# Patient Record
Sex: Female | Born: 1945 | Race: White | Hispanic: No | Smoking: Former smoker
Health system: Southern US, Community
[De-identification: ages and names within clinical notes are randomized; demographics above are authoritative.]

## PROBLEM LIST (undated history)

## (undated) DIAGNOSIS — E119 Type 2 diabetes mellitus without complications: Secondary | ICD-10-CM

## (undated) DIAGNOSIS — G250 Essential tremor: Secondary | ICD-10-CM

## (undated) DIAGNOSIS — I495 Sick sinus syndrome: Secondary | ICD-10-CM

## (undated) DIAGNOSIS — R569 Unspecified convulsions: Secondary | ICD-10-CM

## (undated) DIAGNOSIS — I609 Nontraumatic subarachnoid hemorrhage, unspecified: Secondary | ICD-10-CM

## (undated) DIAGNOSIS — I639 Cerebral infarction, unspecified: Secondary | ICD-10-CM

## (undated) DIAGNOSIS — Z5189 Encounter for other specified aftercare: Secondary | ICD-10-CM

## (undated) DIAGNOSIS — M1990 Unspecified osteoarthritis, unspecified site: Secondary | ICD-10-CM

## (undated) DIAGNOSIS — J8410 Pulmonary fibrosis, unspecified: Secondary | ICD-10-CM

## (undated) DIAGNOSIS — I2510 Atherosclerotic heart disease of native coronary artery without angina pectoris: Secondary | ICD-10-CM

## (undated) DIAGNOSIS — E785 Hyperlipidemia, unspecified: Secondary | ICD-10-CM

## (undated) DIAGNOSIS — I4821 Permanent atrial fibrillation: Secondary | ICD-10-CM

## (undated) DIAGNOSIS — K5790 Diverticulosis of intestine, part unspecified, without perforation or abscess without bleeding: Secondary | ICD-10-CM

## (undated) DIAGNOSIS — G459 Transient cerebral ischemic attack, unspecified: Secondary | ICD-10-CM

## (undated) DIAGNOSIS — I739 Peripheral vascular disease, unspecified: Secondary | ICD-10-CM

## (undated) DIAGNOSIS — Z9229 Personal history of other drug therapy: Secondary | ICD-10-CM

## (undated) DIAGNOSIS — Z7901 Long term (current) use of anticoagulants: Secondary | ICD-10-CM

## (undated) DIAGNOSIS — I10 Essential (primary) hypertension: Secondary | ICD-10-CM

## (undated) DIAGNOSIS — M4800 Spinal stenosis, site unspecified: Secondary | ICD-10-CM

## (undated) DIAGNOSIS — Z9861 Coronary angioplasty status: Secondary | ICD-10-CM

## (undated) DIAGNOSIS — I2699 Other pulmonary embolism without acute cor pulmonale: Secondary | ICD-10-CM

## (undated) DIAGNOSIS — K219 Gastro-esophageal reflux disease without esophagitis: Secondary | ICD-10-CM

## (undated) HISTORY — PX: BASAL CELL CARCINOMA EXCISION: SHX1214

## (undated) HISTORY — DX: Unspecified osteoarthritis, unspecified site: M19.90

## (undated) HISTORY — DX: Unspecified convulsions: R56.9

## (undated) HISTORY — DX: Transient cerebral ischemic attack, unspecified: G45.9

## (undated) HISTORY — DX: Other pulmonary embolism without acute cor pulmonale: I26.99

## (undated) HISTORY — DX: Encounter for other specified aftercare: Z51.89

## (undated) HISTORY — DX: Pulmonary fibrosis, unspecified: J84.10

## (undated) HISTORY — DX: Sick sinus syndrome: I49.5

## (undated) HISTORY — DX: Essential tremor: G25.0

## (undated) HISTORY — DX: Nontraumatic subarachnoid hemorrhage, unspecified: I60.9

## (undated) HISTORY — DX: Coronary angioplasty status: Z98.61

## (undated) HISTORY — DX: Type 2 diabetes mellitus without complications: E11.9

## (undated) HISTORY — DX: Gastro-esophageal reflux disease without esophagitis: K21.9

## (undated) HISTORY — PX: OTHER SURGICAL HISTORY: SHX169

## (undated) HISTORY — DX: Cerebral infarction, unspecified: I63.9

## (undated) HISTORY — DX: Diverticulosis of intestine, part unspecified, without perforation or abscess without bleeding: K57.90

## (undated) HISTORY — DX: Spinal stenosis, site unspecified: M48.00

## (undated) HISTORY — DX: Hyperlipidemia, unspecified: E78.5

## (undated) HISTORY — DX: Peripheral vascular disease, unspecified: I73.9

## (undated) HISTORY — DX: Permanent atrial fibrillation: I48.21

## (undated) HISTORY — PX: POLYPECTOMY: SHX149

## (undated) HISTORY — DX: Personal history of other drug therapy: Z92.29

## (undated) HISTORY — DX: Atherosclerotic heart disease of native coronary artery without angina pectoris: I25.10

## (undated) HISTORY — PX: COLONOSCOPY: SHX174

## (undated) HISTORY — DX: Long term (current) use of anticoagulants: Z79.01

## (undated) HISTORY — DX: Essential (primary) hypertension: I10

---

## 2002-10-23 HISTORY — PX: ABDOMINAL HYSTERECTOMY: SHX81

## 2010-06-23 HISTORY — PX: HERNIA REPAIR: SHX51

## 2012-02-21 HISTORY — PX: OTHER SURGICAL HISTORY: SHX169

## 2012-10-23 DIAGNOSIS — R569 Unspecified convulsions: Secondary | ICD-10-CM

## 2012-10-23 HISTORY — DX: Unspecified convulsions: R56.9

## 2013-07-23 HISTORY — PX: CARDIAC CATHETERIZATION: SHX172

## 2013-08-15 DIAGNOSIS — I2510 Atherosclerotic heart disease of native coronary artery without angina pectoris: Secondary | ICD-10-CM

## 2013-08-15 HISTORY — DX: Atherosclerotic heart disease of native coronary artery without angina pectoris: I25.10

## 2015-01-01 DIAGNOSIS — I609 Nontraumatic subarachnoid hemorrhage, unspecified: Secondary | ICD-10-CM | POA: Diagnosis not present

## 2015-01-01 DIAGNOSIS — I671 Cerebral aneurysm, nonruptured: Secondary | ICD-10-CM | POA: Diagnosis not present

## 2015-01-01 DIAGNOSIS — G40109 Localization-related (focal) (partial) symptomatic epilepsy and epileptic syndromes with simple partial seizures, not intractable, without status epilepticus: Secondary | ICD-10-CM | POA: Diagnosis not present

## 2015-01-13 DIAGNOSIS — R197 Diarrhea, unspecified: Secondary | ICD-10-CM | POA: Diagnosis not present

## 2015-01-13 DIAGNOSIS — K625 Hemorrhage of anus and rectum: Secondary | ICD-10-CM | POA: Diagnosis not present

## 2015-01-13 DIAGNOSIS — I10 Essential (primary) hypertension: Secondary | ICD-10-CM | POA: Diagnosis not present

## 2015-01-13 DIAGNOSIS — R1110 Vomiting, unspecified: Secondary | ICD-10-CM | POA: Diagnosis not present

## 2015-01-22 DIAGNOSIS — K921 Melena: Secondary | ICD-10-CM | POA: Diagnosis not present

## 2015-01-22 DIAGNOSIS — R197 Diarrhea, unspecified: Secondary | ICD-10-CM | POA: Diagnosis not present

## 2015-01-22 DIAGNOSIS — R112 Nausea with vomiting, unspecified: Secondary | ICD-10-CM | POA: Diagnosis not present

## 2015-02-14 DIAGNOSIS — Z7902 Long term (current) use of antithrombotics/antiplatelets: Secondary | ICD-10-CM | POA: Diagnosis not present

## 2015-02-14 DIAGNOSIS — S61213A Laceration without foreign body of left middle finger without damage to nail, initial encounter: Secondary | ICD-10-CM | POA: Diagnosis not present

## 2015-02-14 DIAGNOSIS — S61219A Laceration without foreign body of unspecified finger without damage to nail, initial encounter: Secondary | ICD-10-CM | POA: Diagnosis not present

## 2015-02-14 DIAGNOSIS — Z882 Allergy status to sulfonamides status: Secondary | ICD-10-CM | POA: Diagnosis not present

## 2015-02-16 DIAGNOSIS — I2510 Atherosclerotic heart disease of native coronary artery without angina pectoris: Secondary | ICD-10-CM | POA: Diagnosis not present

## 2015-02-16 DIAGNOSIS — E6609 Other obesity due to excess calories: Secondary | ICD-10-CM | POA: Diagnosis not present

## 2015-02-16 DIAGNOSIS — E119 Type 2 diabetes mellitus without complications: Secondary | ICD-10-CM | POA: Diagnosis not present

## 2015-02-16 DIAGNOSIS — M79605 Pain in left leg: Secondary | ICD-10-CM | POA: Diagnosis not present

## 2015-02-16 DIAGNOSIS — I10 Essential (primary) hypertension: Secondary | ICD-10-CM | POA: Diagnosis not present

## 2015-02-16 DIAGNOSIS — I482 Chronic atrial fibrillation: Secondary | ICD-10-CM | POA: Diagnosis not present

## 2015-02-16 DIAGNOSIS — M79604 Pain in right leg: Secondary | ICD-10-CM | POA: Diagnosis not present

## 2015-02-16 DIAGNOSIS — E782 Mixed hyperlipidemia: Secondary | ICD-10-CM | POA: Diagnosis not present

## 2015-02-22 DIAGNOSIS — I482 Chronic atrial fibrillation: Secondary | ICD-10-CM | POA: Diagnosis not present

## 2015-02-23 DIAGNOSIS — S61218A Laceration without foreign body of other finger without damage to nail, initial encounter: Secondary | ICD-10-CM | POA: Diagnosis not present

## 2015-03-04 DIAGNOSIS — E785 Hyperlipidemia, unspecified: Secondary | ICD-10-CM | POA: Diagnosis not present

## 2015-03-04 DIAGNOSIS — Z79899 Other long term (current) drug therapy: Secondary | ICD-10-CM | POA: Diagnosis not present

## 2015-03-04 DIAGNOSIS — E119 Type 2 diabetes mellitus without complications: Secondary | ICD-10-CM | POA: Diagnosis not present

## 2015-03-04 DIAGNOSIS — R001 Bradycardia, unspecified: Secondary | ICD-10-CM | POA: Diagnosis not present

## 2015-03-04 DIAGNOSIS — Z7901 Long term (current) use of anticoagulants: Secondary | ICD-10-CM | POA: Diagnosis not present

## 2015-03-04 DIAGNOSIS — Z01811 Encounter for preprocedural respiratory examination: Secondary | ICD-10-CM | POA: Diagnosis not present

## 2015-03-04 DIAGNOSIS — I10 Essential (primary) hypertension: Secondary | ICD-10-CM | POA: Diagnosis not present

## 2015-03-04 DIAGNOSIS — E669 Obesity, unspecified: Secondary | ICD-10-CM | POA: Diagnosis not present

## 2015-03-04 DIAGNOSIS — I480 Paroxysmal atrial fibrillation: Secondary | ICD-10-CM | POA: Diagnosis not present

## 2015-03-05 DIAGNOSIS — R001 Bradycardia, unspecified: Secondary | ICD-10-CM | POA: Diagnosis not present

## 2015-03-05 DIAGNOSIS — Z950 Presence of cardiac pacemaker: Secondary | ICD-10-CM | POA: Diagnosis not present

## 2015-03-05 DIAGNOSIS — E785 Hyperlipidemia, unspecified: Secondary | ICD-10-CM | POA: Diagnosis not present

## 2015-03-05 DIAGNOSIS — I480 Paroxysmal atrial fibrillation: Secondary | ICD-10-CM | POA: Diagnosis not present

## 2015-03-05 DIAGNOSIS — Z7901 Long term (current) use of anticoagulants: Secondary | ICD-10-CM | POA: Diagnosis not present

## 2015-03-05 DIAGNOSIS — E669 Obesity, unspecified: Secondary | ICD-10-CM | POA: Diagnosis not present

## 2015-03-05 DIAGNOSIS — E119 Type 2 diabetes mellitus without complications: Secondary | ICD-10-CM | POA: Diagnosis not present

## 2015-03-05 HISTORY — PX: PACEMAKER INSERTION: SHX728

## 2015-03-06 DIAGNOSIS — E785 Hyperlipidemia, unspecified: Secondary | ICD-10-CM | POA: Diagnosis not present

## 2015-03-06 DIAGNOSIS — R001 Bradycardia, unspecified: Secondary | ICD-10-CM | POA: Diagnosis not present

## 2015-03-06 DIAGNOSIS — E119 Type 2 diabetes mellitus without complications: Secondary | ICD-10-CM | POA: Diagnosis not present

## 2015-03-06 DIAGNOSIS — Z7901 Long term (current) use of anticoagulants: Secondary | ICD-10-CM | POA: Diagnosis not present

## 2015-03-06 DIAGNOSIS — I480 Paroxysmal atrial fibrillation: Secondary | ICD-10-CM | POA: Diagnosis not present

## 2015-03-06 DIAGNOSIS — E669 Obesity, unspecified: Secondary | ICD-10-CM | POA: Diagnosis not present

## 2015-03-18 DIAGNOSIS — I482 Chronic atrial fibrillation: Secondary | ICD-10-CM | POA: Diagnosis not present

## 2015-03-18 DIAGNOSIS — Z6831 Body mass index (BMI) 31.0-31.9, adult: Secondary | ICD-10-CM | POA: Diagnosis not present

## 2015-03-18 DIAGNOSIS — I2699 Other pulmonary embolism without acute cor pulmonale: Secondary | ICD-10-CM | POA: Diagnosis not present

## 2015-03-18 DIAGNOSIS — I2510 Atherosclerotic heart disease of native coronary artery without angina pectoris: Secondary | ICD-10-CM | POA: Diagnosis not present

## 2015-03-18 DIAGNOSIS — Z45010 Encounter for checking and testing of cardiac pacemaker pulse generator [battery]: Secondary | ICD-10-CM | POA: Diagnosis not present

## 2015-03-18 DIAGNOSIS — E119 Type 2 diabetes mellitus without complications: Secondary | ICD-10-CM | POA: Diagnosis not present

## 2015-03-18 DIAGNOSIS — I10 Essential (primary) hypertension: Secondary | ICD-10-CM | POA: Diagnosis not present

## 2015-03-18 DIAGNOSIS — Z45018 Encounter for adjustment and management of other part of cardiac pacemaker: Secondary | ICD-10-CM | POA: Diagnosis not present

## 2015-03-24 DIAGNOSIS — H0231 Blepharochalasis right upper eyelid: Secondary | ICD-10-CM | POA: Diagnosis not present

## 2015-03-24 DIAGNOSIS — H2513 Age-related nuclear cataract, bilateral: Secondary | ICD-10-CM | POA: Diagnosis not present

## 2015-03-24 DIAGNOSIS — H3561 Retinal hemorrhage, right eye: Secondary | ICD-10-CM | POA: Diagnosis not present

## 2015-03-24 DIAGNOSIS — E119 Type 2 diabetes mellitus without complications: Secondary | ICD-10-CM | POA: Diagnosis not present

## 2015-03-24 DIAGNOSIS — H0234 Blepharochalasis left upper eyelid: Secondary | ICD-10-CM | POA: Diagnosis not present

## 2015-03-25 DIAGNOSIS — Z8673 Personal history of transient ischemic attack (TIA), and cerebral infarction without residual deficits: Secondary | ICD-10-CM | POA: Diagnosis not present

## 2015-03-25 DIAGNOSIS — I493 Ventricular premature depolarization: Secondary | ICD-10-CM | POA: Diagnosis not present

## 2015-03-25 DIAGNOSIS — M6281 Muscle weakness (generalized): Secondary | ICD-10-CM | POA: Diagnosis not present

## 2015-03-25 DIAGNOSIS — E119 Type 2 diabetes mellitus without complications: Secondary | ICD-10-CM | POA: Diagnosis not present

## 2015-03-25 DIAGNOSIS — R112 Nausea with vomiting, unspecified: Secondary | ICD-10-CM | POA: Diagnosis not present

## 2015-03-25 DIAGNOSIS — I10 Essential (primary) hypertension: Secondary | ICD-10-CM | POA: Diagnosis not present

## 2015-03-25 DIAGNOSIS — I4891 Unspecified atrial fibrillation: Secondary | ICD-10-CM | POA: Diagnosis not present

## 2015-03-25 DIAGNOSIS — R531 Weakness: Secondary | ICD-10-CM | POA: Diagnosis not present

## 2015-03-25 DIAGNOSIS — Z87891 Personal history of nicotine dependence: Secondary | ICD-10-CM | POA: Diagnosis not present

## 2015-03-25 DIAGNOSIS — Z9989 Dependence on other enabling machines and devices: Secondary | ICD-10-CM | POA: Diagnosis not present

## 2015-03-25 DIAGNOSIS — R42 Dizziness and giddiness: Secondary | ICD-10-CM | POA: Diagnosis not present

## 2015-03-25 DIAGNOSIS — Z882 Allergy status to sulfonamides status: Secondary | ICD-10-CM | POA: Diagnosis not present

## 2015-03-27 DIAGNOSIS — G20 Parkinson's disease: Secondary | ICD-10-CM | POA: Diagnosis not present

## 2015-03-27 DIAGNOSIS — Z8673 Personal history of transient ischemic attack (TIA), and cerebral infarction without residual deficits: Secondary | ICD-10-CM | POA: Diagnosis not present

## 2015-03-27 DIAGNOSIS — I10 Essential (primary) hypertension: Secondary | ICD-10-CM | POA: Diagnosis not present

## 2015-03-27 DIAGNOSIS — K5730 Diverticulosis of large intestine without perforation or abscess without bleeding: Secondary | ICD-10-CM | POA: Diagnosis not present

## 2015-03-27 DIAGNOSIS — B349 Viral infection, unspecified: Secondary | ICD-10-CM | POA: Diagnosis not present

## 2015-03-27 DIAGNOSIS — G319 Degenerative disease of nervous system, unspecified: Secondary | ICD-10-CM | POA: Diagnosis not present

## 2015-03-27 DIAGNOSIS — R1110 Vomiting, unspecified: Secondary | ICD-10-CM | POA: Diagnosis not present

## 2015-03-27 DIAGNOSIS — E119 Type 2 diabetes mellitus without complications: Secondary | ICD-10-CM | POA: Diagnosis not present

## 2015-03-27 DIAGNOSIS — I2510 Atherosclerotic heart disease of native coronary artery without angina pectoris: Secondary | ICD-10-CM | POA: Diagnosis not present

## 2015-03-27 DIAGNOSIS — M4800 Spinal stenosis, site unspecified: Secondary | ICD-10-CM | POA: Diagnosis not present

## 2015-03-27 DIAGNOSIS — R509 Fever, unspecified: Secondary | ICD-10-CM | POA: Diagnosis not present

## 2015-03-27 DIAGNOSIS — I609 Nontraumatic subarachnoid hemorrhage, unspecified: Secondary | ICD-10-CM | POA: Diagnosis not present

## 2015-03-27 DIAGNOSIS — R531 Weakness: Secondary | ICD-10-CM | POA: Diagnosis not present

## 2015-03-27 DIAGNOSIS — E860 Dehydration: Secondary | ICD-10-CM | POA: Diagnosis not present

## 2015-03-27 DIAGNOSIS — Z9989 Dependence on other enabling machines and devices: Secondary | ICD-10-CM | POA: Diagnosis not present

## 2015-03-28 DIAGNOSIS — I2510 Atherosclerotic heart disease of native coronary artery without angina pectoris: Secondary | ICD-10-CM | POA: Diagnosis not present

## 2015-03-28 DIAGNOSIS — I10 Essential (primary) hypertension: Secondary | ICD-10-CM | POA: Diagnosis not present

## 2015-03-28 DIAGNOSIS — E119 Type 2 diabetes mellitus without complications: Secondary | ICD-10-CM | POA: Diagnosis not present

## 2015-03-28 DIAGNOSIS — I609 Nontraumatic subarachnoid hemorrhage, unspecified: Secondary | ICD-10-CM | POA: Diagnosis not present

## 2015-03-28 DIAGNOSIS — E860 Dehydration: Secondary | ICD-10-CM | POA: Diagnosis not present

## 2015-03-28 DIAGNOSIS — R509 Fever, unspecified: Secondary | ICD-10-CM | POA: Diagnosis not present

## 2015-03-29 DIAGNOSIS — H3563 Retinal hemorrhage, bilateral: Secondary | ICD-10-CM | POA: Diagnosis not present

## 2015-03-29 DIAGNOSIS — E119 Type 2 diabetes mellitus without complications: Secondary | ICD-10-CM | POA: Diagnosis not present

## 2015-04-02 DIAGNOSIS — E860 Dehydration: Secondary | ICD-10-CM | POA: Diagnosis not present

## 2015-04-02 DIAGNOSIS — E119 Type 2 diabetes mellitus without complications: Secondary | ICD-10-CM | POA: Diagnosis not present

## 2015-04-02 DIAGNOSIS — Z950 Presence of cardiac pacemaker: Secondary | ICD-10-CM | POA: Diagnosis not present

## 2015-04-02 DIAGNOSIS — N390 Urinary tract infection, site not specified: Secondary | ICD-10-CM | POA: Diagnosis not present

## 2015-04-06 DIAGNOSIS — M170 Bilateral primary osteoarthritis of knee: Secondary | ICD-10-CM | POA: Diagnosis not present

## 2015-04-12 DIAGNOSIS — M6250 Muscle wasting and atrophy, not elsewhere classified, unspecified site: Secondary | ICD-10-CM | POA: Diagnosis not present

## 2015-04-12 DIAGNOSIS — R531 Weakness: Secondary | ICD-10-CM | POA: Diagnosis not present

## 2015-04-12 DIAGNOSIS — R293 Abnormal posture: Secondary | ICD-10-CM | POA: Diagnosis not present

## 2015-04-12 DIAGNOSIS — R269 Unspecified abnormalities of gait and mobility: Secondary | ICD-10-CM | POA: Diagnosis not present

## 2015-04-12 DIAGNOSIS — M7710 Lateral epicondylitis, unspecified elbow: Secondary | ICD-10-CM | POA: Diagnosis not present

## 2015-04-12 DIAGNOSIS — Z5189 Encounter for other specified aftercare: Secondary | ICD-10-CM | POA: Diagnosis not present

## 2015-04-12 DIAGNOSIS — M170 Bilateral primary osteoarthritis of knee: Secondary | ICD-10-CM | POA: Diagnosis not present

## 2015-04-16 DIAGNOSIS — R269 Unspecified abnormalities of gait and mobility: Secondary | ICD-10-CM | POA: Diagnosis not present

## 2015-04-16 DIAGNOSIS — M170 Bilateral primary osteoarthritis of knee: Secondary | ICD-10-CM | POA: Diagnosis not present

## 2015-04-16 DIAGNOSIS — R531 Weakness: Secondary | ICD-10-CM | POA: Diagnosis not present

## 2015-04-16 DIAGNOSIS — M6250 Muscle wasting and atrophy, not elsewhere classified, unspecified site: Secondary | ICD-10-CM | POA: Diagnosis not present

## 2015-04-16 DIAGNOSIS — M7710 Lateral epicondylitis, unspecified elbow: Secondary | ICD-10-CM | POA: Diagnosis not present

## 2015-04-16 DIAGNOSIS — R293 Abnormal posture: Secondary | ICD-10-CM | POA: Diagnosis not present

## 2015-04-20 DIAGNOSIS — M170 Bilateral primary osteoarthritis of knee: Secondary | ICD-10-CM | POA: Diagnosis not present

## 2015-04-20 DIAGNOSIS — R293 Abnormal posture: Secondary | ICD-10-CM | POA: Diagnosis not present

## 2015-04-20 DIAGNOSIS — M6250 Muscle wasting and atrophy, not elsewhere classified, unspecified site: Secondary | ICD-10-CM | POA: Diagnosis not present

## 2015-04-20 DIAGNOSIS — R269 Unspecified abnormalities of gait and mobility: Secondary | ICD-10-CM | POA: Diagnosis not present

## 2015-04-20 DIAGNOSIS — M7710 Lateral epicondylitis, unspecified elbow: Secondary | ICD-10-CM | POA: Diagnosis not present

## 2015-04-20 DIAGNOSIS — R531 Weakness: Secondary | ICD-10-CM | POA: Diagnosis not present

## 2015-04-23 DIAGNOSIS — M25662 Stiffness of left knee, not elsewhere classified: Secondary | ICD-10-CM | POA: Diagnosis not present

## 2015-04-23 DIAGNOSIS — R269 Unspecified abnormalities of gait and mobility: Secondary | ICD-10-CM | POA: Diagnosis not present

## 2015-04-23 DIAGNOSIS — Z950 Presence of cardiac pacemaker: Secondary | ICD-10-CM | POA: Diagnosis not present

## 2015-04-23 DIAGNOSIS — M25661 Stiffness of right knee, not elsewhere classified: Secondary | ICD-10-CM | POA: Diagnosis not present

## 2015-04-23 DIAGNOSIS — I10 Essential (primary) hypertension: Secondary | ICD-10-CM | POA: Diagnosis not present

## 2015-04-23 DIAGNOSIS — M25562 Pain in left knee: Secondary | ICD-10-CM | POA: Diagnosis not present

## 2015-04-23 DIAGNOSIS — M170 Bilateral primary osteoarthritis of knee: Secondary | ICD-10-CM | POA: Diagnosis not present

## 2015-04-23 DIAGNOSIS — R293 Abnormal posture: Secondary | ICD-10-CM | POA: Diagnosis not present

## 2015-04-23 DIAGNOSIS — R29898 Other symptoms and signs involving the musculoskeletal system: Secondary | ICD-10-CM | POA: Diagnosis not present

## 2015-04-23 DIAGNOSIS — M6250 Muscle wasting and atrophy, not elsewhere classified, unspecified site: Secondary | ICD-10-CM | POA: Diagnosis not present

## 2015-04-23 DIAGNOSIS — M25561 Pain in right knee: Secondary | ICD-10-CM | POA: Diagnosis not present

## 2015-04-23 DIAGNOSIS — E119 Type 2 diabetes mellitus without complications: Secondary | ICD-10-CM | POA: Diagnosis not present

## 2015-04-23 DIAGNOSIS — H0232 Blepharochalasis right lower eyelid: Secondary | ICD-10-CM | POA: Diagnosis not present

## 2015-04-23 DIAGNOSIS — H0231 Blepharochalasis right upper eyelid: Secondary | ICD-10-CM | POA: Diagnosis not present

## 2015-04-28 DIAGNOSIS — M170 Bilateral primary osteoarthritis of knee: Secondary | ICD-10-CM | POA: Diagnosis not present

## 2015-04-28 DIAGNOSIS — R29898 Other symptoms and signs involving the musculoskeletal system: Secondary | ICD-10-CM | POA: Diagnosis not present

## 2015-04-28 DIAGNOSIS — M25561 Pain in right knee: Secondary | ICD-10-CM | POA: Diagnosis not present

## 2015-04-28 DIAGNOSIS — M25661 Stiffness of right knee, not elsewhere classified: Secondary | ICD-10-CM | POA: Diagnosis not present

## 2015-04-28 DIAGNOSIS — M25662 Stiffness of left knee, not elsewhere classified: Secondary | ICD-10-CM | POA: Diagnosis not present

## 2015-04-28 DIAGNOSIS — M25562 Pain in left knee: Secondary | ICD-10-CM | POA: Diagnosis not present

## 2015-04-30 DIAGNOSIS — M25661 Stiffness of right knee, not elsewhere classified: Secondary | ICD-10-CM | POA: Diagnosis not present

## 2015-04-30 DIAGNOSIS — M170 Bilateral primary osteoarthritis of knee: Secondary | ICD-10-CM | POA: Diagnosis not present

## 2015-04-30 DIAGNOSIS — R29898 Other symptoms and signs involving the musculoskeletal system: Secondary | ICD-10-CM | POA: Diagnosis not present

## 2015-04-30 DIAGNOSIS — M25561 Pain in right knee: Secondary | ICD-10-CM | POA: Diagnosis not present

## 2015-04-30 DIAGNOSIS — M25562 Pain in left knee: Secondary | ICD-10-CM | POA: Diagnosis not present

## 2015-04-30 DIAGNOSIS — M25662 Stiffness of left knee, not elsewhere classified: Secondary | ICD-10-CM | POA: Diagnosis not present

## 2015-05-04 DIAGNOSIS — M25661 Stiffness of right knee, not elsewhere classified: Secondary | ICD-10-CM | POA: Diagnosis not present

## 2015-05-04 DIAGNOSIS — M170 Bilateral primary osteoarthritis of knee: Secondary | ICD-10-CM | POA: Diagnosis not present

## 2015-05-04 DIAGNOSIS — M25562 Pain in left knee: Secondary | ICD-10-CM | POA: Diagnosis not present

## 2015-05-04 DIAGNOSIS — M25561 Pain in right knee: Secondary | ICD-10-CM | POA: Diagnosis not present

## 2015-05-04 DIAGNOSIS — R29898 Other symptoms and signs involving the musculoskeletal system: Secondary | ICD-10-CM | POA: Diagnosis not present

## 2015-05-04 DIAGNOSIS — M25662 Stiffness of left knee, not elsewhere classified: Secondary | ICD-10-CM | POA: Diagnosis not present

## 2015-05-06 DIAGNOSIS — M25561 Pain in right knee: Secondary | ICD-10-CM | POA: Diagnosis not present

## 2015-05-06 DIAGNOSIS — R29898 Other symptoms and signs involving the musculoskeletal system: Secondary | ICD-10-CM | POA: Diagnosis not present

## 2015-05-06 DIAGNOSIS — M25661 Stiffness of right knee, not elsewhere classified: Secondary | ICD-10-CM | POA: Diagnosis not present

## 2015-05-06 DIAGNOSIS — M170 Bilateral primary osteoarthritis of knee: Secondary | ICD-10-CM | POA: Diagnosis not present

## 2015-05-06 DIAGNOSIS — M25662 Stiffness of left knee, not elsewhere classified: Secondary | ICD-10-CM | POA: Diagnosis not present

## 2015-05-06 DIAGNOSIS — M25562 Pain in left knee: Secondary | ICD-10-CM | POA: Diagnosis not present

## 2015-05-13 DIAGNOSIS — N390 Urinary tract infection, site not specified: Secondary | ICD-10-CM | POA: Diagnosis not present

## 2015-06-18 DIAGNOSIS — N390 Urinary tract infection, site not specified: Secondary | ICD-10-CM | POA: Diagnosis not present

## 2015-06-18 DIAGNOSIS — E785 Hyperlipidemia, unspecified: Secondary | ICD-10-CM | POA: Diagnosis not present

## 2015-06-18 DIAGNOSIS — E119 Type 2 diabetes mellitus without complications: Secondary | ICD-10-CM | POA: Diagnosis not present

## 2015-06-18 DIAGNOSIS — I10 Essential (primary) hypertension: Secondary | ICD-10-CM | POA: Diagnosis not present

## 2015-06-18 DIAGNOSIS — B952 Enterococcus as the cause of diseases classified elsewhere: Secondary | ICD-10-CM | POA: Diagnosis not present

## 2015-06-18 DIAGNOSIS — R300 Dysuria: Secondary | ICD-10-CM | POA: Diagnosis not present

## 2015-06-18 DIAGNOSIS — I2510 Atherosclerotic heart disease of native coronary artery without angina pectoris: Secondary | ICD-10-CM | POA: Diagnosis not present

## 2015-06-18 DIAGNOSIS — I4891 Unspecified atrial fibrillation: Secondary | ICD-10-CM | POA: Diagnosis not present

## 2015-07-02 ENCOUNTER — Telehealth: Payer: Self-pay | Admitting: *Deleted

## 2015-07-02 NOTE — Telephone Encounter (Signed)
Went to see what happen with the records, MT is going to call for them and bring them to me upon arrival.

## 2015-07-07 ENCOUNTER — Ambulatory Visit (INDEPENDENT_AMBULATORY_CARE_PROVIDER_SITE_OTHER): Payer: Medicare Other | Admitting: Internal Medicine

## 2015-07-07 ENCOUNTER — Other Ambulatory Visit: Payer: Self-pay

## 2015-07-07 ENCOUNTER — Encounter: Payer: Self-pay | Admitting: Internal Medicine

## 2015-07-07 VITALS — BP 122/70 | HR 70 | Ht 66.0 in | Wt 208.0 lb

## 2015-07-07 DIAGNOSIS — E785 Hyperlipidemia, unspecified: Secondary | ICD-10-CM | POA: Insufficient documentation

## 2015-07-07 DIAGNOSIS — M79609 Pain in unspecified limb: Secondary | ICD-10-CM | POA: Insufficient documentation

## 2015-07-07 DIAGNOSIS — Z9229 Personal history of other drug therapy: Secondary | ICD-10-CM | POA: Insufficient documentation

## 2015-07-07 DIAGNOSIS — R9431 Abnormal electrocardiogram [ECG] [EKG]: Secondary | ICD-10-CM | POA: Insufficient documentation

## 2015-07-07 DIAGNOSIS — E119 Type 2 diabetes mellitus without complications: Secondary | ICD-10-CM | POA: Insufficient documentation

## 2015-07-07 DIAGNOSIS — I10 Essential (primary) hypertension: Secondary | ICD-10-CM

## 2015-07-07 DIAGNOSIS — Z87898 Personal history of other specified conditions: Secondary | ICD-10-CM | POA: Insufficient documentation

## 2015-07-07 DIAGNOSIS — M4800 Spinal stenosis, site unspecified: Secondary | ICD-10-CM | POA: Insufficient documentation

## 2015-07-07 DIAGNOSIS — I609 Nontraumatic subarachnoid hemorrhage, unspecified: Secondary | ICD-10-CM | POA: Insufficient documentation

## 2015-07-07 DIAGNOSIS — I4891 Unspecified atrial fibrillation: Secondary | ICD-10-CM

## 2015-07-07 DIAGNOSIS — R079 Chest pain, unspecified: Secondary | ICD-10-CM | POA: Insufficient documentation

## 2015-07-07 DIAGNOSIS — Z7901 Long term (current) use of anticoagulants: Secondary | ICD-10-CM | POA: Insufficient documentation

## 2015-07-07 DIAGNOSIS — R001 Bradycardia, unspecified: Secondary | ICD-10-CM

## 2015-07-07 DIAGNOSIS — I739 Peripheral vascular disease, unspecified: Secondary | ICD-10-CM | POA: Insufficient documentation

## 2015-07-07 DIAGNOSIS — Z8673 Personal history of transient ischemic attack (TIA), and cerebral infarction without residual deficits: Secondary | ICD-10-CM | POA: Insufficient documentation

## 2015-07-07 DIAGNOSIS — I219 Acute myocardial infarction, unspecified: Secondary | ICD-10-CM | POA: Insufficient documentation

## 2015-07-07 DIAGNOSIS — G20 Parkinson's disease: Secondary | ICD-10-CM | POA: Insufficient documentation

## 2015-07-07 DIAGNOSIS — I2699 Other pulmonary embolism without acute cor pulmonale: Secondary | ICD-10-CM | POA: Insufficient documentation

## 2015-07-07 DIAGNOSIS — Z9861 Coronary angioplasty status: Secondary | ICD-10-CM | POA: Insufficient documentation

## 2015-07-07 DIAGNOSIS — K5790 Diverticulosis of intestine, part unspecified, without perforation or abscess without bleeding: Secondary | ICD-10-CM | POA: Insufficient documentation

## 2015-07-07 DIAGNOSIS — G459 Transient cerebral ischemic attack, unspecified: Secondary | ICD-10-CM | POA: Insufficient documentation

## 2015-07-07 DIAGNOSIS — K219 Gastro-esophageal reflux disease without esophagitis: Secondary | ICD-10-CM | POA: Insufficient documentation

## 2015-07-07 NOTE — Progress Notes (Signed)
Primary Care:  Dr Job Founds in [place] Primary Cardiologist:  none  Heather May is a 69 y.o. female with a h/o bradycardia sp [employer] Corporate investment banker) in [place] for presyncope/ falls and bradycardia who presents today to establish care in the Electrophysiology device clinic.   She recently moved to [place] from [place] to be closer to family.  She has persistent afib.  She V paces 82%.  She reports having afib for "years" and is typical asymptomatic.  She was previously on coumadin but had ICH.  She has since been treated with ASA and plavix.   She is primarily limited by knee pain.    Today, she  denies symptoms of palpitations, chest pain, shortness of breath, orthopnea, PND, lower extremity edema, dizziness, presyncope, syncope, or neurologic sequela.  The patientis tolerating medications without difficulties and is otherwise without complaint today.   Past Medical History  Diagnosis Date  . CAD (coronary artery disease) 08/15/13  . Seizure 2014    occured in setting of [place]  . Hypertension   . GERD (gastroesophageal reflux disease)   . Permanent atrial fibrillation     chads2vasc score is at least 5  . Diabetes mellitus without complication   . Essential tremor   . Transient ischemic attack   . Subarachnoid hemorrhage   . Spinal stenosis   . Diverticulosis   . PAD (peripheral artery disease)   . Pulmonary embolism   . Percutaneous transluminal coronary angioplasty status   . Long-term (current) use of anticoagulants   . Hyperlipidemia     mixed  . History of long-term use of multiple prescription drugs   . Sick sinus syndrome   . Pulmonary fibrosis    Past Surgical History  Procedure Laterality Date  . Cardiac catheterization  10/14    PCI   . Basal cell carcinoma excision    . Cesarean section N/A 1978    x 1  . Hernia repair  06/2010  . Abdominal hysterectomy  2004  . Removal of big toe nail Bilateral   . Carpel tunnel surgery Bilateral 2006/ 2010  . Titanium pins  inserted metatarsol bone  02/2012    2 pins placed   . Pacemaker insertion  03/05/15    [place] DR implanted in [place], [place]    Social History   Social History  . Marital Status: Widowed    Spouse Name: N/A  . Number of Children: N/A  . Years of Education: N/A   Occupational History  . Not on file.   Social History Main Topics  . Smoking status: Former Smoker    Quit date: 07/06/2009  . Smokeless tobacco: Not on file     Comment: didnt smoke much  . Alcohol Use: 0.0 oz/week    0 Standard drinks or equivalent per week     Comment: very infrequently  . Drug Use: No  . Sexual Activity: Not on file   Other Topics Concern  . Not on file   Social History Narrative   Pt recently moved to [facility] [place].  She now lived with her daughter.   Here today with her partner.   Previously worked as a Teacher, music.    Family History  Problem Relation Age of Onset  . Stroke Mother   . Epilepsy Mother   . Stroke Father   . Heart attack Father   . Atrial fibrillation Sister   . Atrial fibrillation Brother   . Diabetes  Mellitus II Brother     Allergies  Allergen Reactions  . Sulfa Antibiotics     RASH    Current Outpatient Prescriptions  Medication Sig Dispense Refill  . aspirin 81 MG tablet Take 81 mg by mouth daily.    . benazepril (LOTENSIN) 10 MG tablet Take 10 mg by mouth daily.    . enalapril (VASOTEC) 10 MG tablet Take 10 mg by mouth daily.    [name] glipiZIDE (GLUCOTROL) 10 MG tablet Take 10 mg by mouth daily before breakfast.    . glipiZIDE (GLUCOTROL) 5 MG tablet Take 5 mg by mouth at bedtime.     . lansoprazole (PREVACID) 15 MG capsule Take 15 mg by mouth daily at 12 noon.    . levETIRAcetam (KEPPRA) 500 MG tablet Take 500 mg by mouth 2 (two) times daily.    [name] lovastatin (MEVACOR) 40 MG tablet Take 40 mg by mouth at bedtime.    . Multiple Vitamin (MULTIVITAMIN) tablet Take 1 tablet by mouth daily.    . nitroGLYCERIN  (NITROSTAT) 0.4 MG SL tablet Place 0.4 mg under the tongue every 5 (five) minutes as needed for chest pain.    [name] ondansetron (ZOFRAN) 4 MG tablet Take 4 mg by mouth every 8 (eight) hours as needed for nausea or vomiting.     No current facility-administered medications for this visit.    ROS- all systems are reviewed and negative except as per HPI  Physical Exam: Filed Vitals:   07/07/15 1533  BP: 122/70  Pulse: 70  Height: 5\' 6"  (1.676 m)  Weight: 94.348 kg (208 lb)    GEN- The patient is well appearing, alert and oriented x 3 today.   Head- normocephalic, atraumatic Eyes-  Sclera clear, conjunctiva pink Ears- hearing intact Oropharynx- clear Neck- supple, no JVP Lymph- no cervical lymphadenopathy Lungs- Clear to ausculation bilaterally, normal work of breathing Chest- pacemaker pocket is well healed Heart- Regular rate and rhythm, no murmurs, rubs or gallops, PMI not laterally displaced GI- soft, NT, ND, + BS Extremities- no clubbing, cyanosis, or edema MS- no significant deformity or atrophy Skin- no rash or lesion Psych- euthymic mood, full affect Neuro- strength and sensation are intact  Pacemaker interrogation- reviewed in detail today,  See PACEART report  Assessment and Plan:  1. Symptomatic bradycardia Normal pacemaker function See Pace Art report No changes today  2. Permanent atrial fibrillation Not a candidate for long term coumadin though her chads2vasc score is at least 5. Today, we discussed watchman (LAA closure device) procedure at length.  She is very interested in this approach for long term stroke prevention.  She is worried about stroke, but is also worried given her h/o ICH about long term anticoagulation.  She will need TEE prior to proceeding as well as an independent assessment by one of our general cardiologists to discuss risks/ benefits of the procedure.  Hopefully, we could proceed in the next mont or so.  She will be contact by our Acupuncturist.    3. CAD No ischemic symptoms No changes Will need to establish with general cardiology  4. HTN Stable No change required today  She will be contacted by our [facility] program coordinator and will be referred to general cardiology. She will follow-up with EP NP.

## 2015-07-07 NOTE — Patient Instructions (Signed)
Medication Instructions:  Your physician recommends that you continue on your current medications as directed. Please refer to the Current Medication list given to you today.   Labwork: None ordered  Testing/Procedures: None ordered  Follow-Up: Your physician recommends that you schedule a follow-up appointment in: 3 months with Amber Seiler, NP   Any Other Special Instructions Will Be Listed Below (If Applicable).   

## 2015-07-08 LAB — CUP PACEART INCLINIC DEVICE CHECK
Date Time Interrogation Session: 20160914040000
Lead Channel Impedance Value: 395 Ohm
Lead Channel Impedance Value: 772 Ohm
Lead Channel Pacing Threshold Amplitude: 0.7 V
Lead Channel Pacing Threshold Pulse Width: 0.5 ms
Lead Channel Sensing Intrinsic Amplitude: 1.3 mV
Lead Channel Sensing Intrinsic Amplitude: 8 mV
Lead Channel Setting Pacing Amplitude: 3.5 V
Lead Channel Setting Pacing Amplitude: 3.5 V
Lead Channel Setting Pacing Pulse Width: 0.5 ms
Lead Channel Setting Sensing Sensitivity: 0.6 mV
Pulse Gen Serial Number: 732886
Zone Setting Detection Interval: 375 ms

## 2015-07-09 DIAGNOSIS — E119 Type 2 diabetes mellitus without complications: Secondary | ICD-10-CM | POA: Diagnosis not present

## 2015-07-11 DIAGNOSIS — R001 Bradycardia, unspecified: Secondary | ICD-10-CM | POA: Insufficient documentation

## 2015-07-16 DIAGNOSIS — E785 Hyperlipidemia, unspecified: Secondary | ICD-10-CM | POA: Diagnosis not present

## 2015-07-16 DIAGNOSIS — E119 Type 2 diabetes mellitus without complications: Secondary | ICD-10-CM | POA: Diagnosis not present

## 2015-07-27 ENCOUNTER — Encounter: Payer: Self-pay | Admitting: Internal Medicine

## 2015-07-29 DIAGNOSIS — Z0000 Encounter for general adult medical examination without abnormal findings: Secondary | ICD-10-CM | POA: Diagnosis not present

## 2015-07-29 DIAGNOSIS — E785 Hyperlipidemia, unspecified: Secondary | ICD-10-CM | POA: Diagnosis not present

## 2015-07-29 DIAGNOSIS — Z23 Encounter for immunization: Secondary | ICD-10-CM | POA: Diagnosis not present

## 2015-07-29 DIAGNOSIS — I2510 Atherosclerotic heart disease of native coronary artery without angina pectoris: Secondary | ICD-10-CM | POA: Diagnosis not present

## 2015-07-29 DIAGNOSIS — I10 Essential (primary) hypertension: Secondary | ICD-10-CM | POA: Diagnosis not present

## 2015-07-29 DIAGNOSIS — G250 Essential tremor: Secondary | ICD-10-CM | POA: Diagnosis not present

## 2015-07-29 DIAGNOSIS — E119 Type 2 diabetes mellitus without complications: Secondary | ICD-10-CM | POA: Diagnosis not present

## 2015-07-29 DIAGNOSIS — I4891 Unspecified atrial fibrillation: Secondary | ICD-10-CM | POA: Diagnosis not present

## 2015-07-29 DIAGNOSIS — I69398 Other sequelae of cerebral infarction: Secondary | ICD-10-CM | POA: Diagnosis not present

## 2015-07-29 DIAGNOSIS — G40909 Epilepsy, unspecified, not intractable, without status epilepticus: Secondary | ICD-10-CM | POA: Diagnosis not present

## 2015-08-04 ENCOUNTER — Encounter: Payer: Self-pay | Admitting: *Deleted

## 2015-08-04 ENCOUNTER — Telehealth: Payer: Self-pay | Admitting: Nurse Practitioner

## 2015-08-04 ENCOUNTER — Other Ambulatory Visit: Payer: Self-pay | Admitting: Nurse Practitioner

## 2015-08-04 NOTE — Telephone Encounter (Signed)
Spoke with patient about TEE needed pre-Watchman implant. TEE scheduled for Monday October 24th at 11AM with Dr Debara Pickett.  NPO after midnight, arrive at 10AM, will need someone to drive home.  Pt aware and agrees with plan. She will call back if she needs to reschedule due to transportation.  Orders entered  If she calls back and cannot do this day, please call me before rescheduling.  Chanetta Marshall, NP 08/04/2015 9:52 AM

## 2015-08-16 ENCOUNTER — Ambulatory Visit (HOSPITAL_COMMUNITY)
Admission: RE | Admit: 2015-08-16 | Discharge: 2015-08-16 | Disposition: A | Discharge status: Home / Self Care | Payer: Medicare Other | Source: Ambulatory Visit | Attending: Internal Medicine | Admitting: Internal Medicine

## 2015-08-16 ENCOUNTER — Encounter (HOSPITAL_COMMUNITY)
Admission: RE | Disposition: A | Discharge status: Home / Self Care | Payer: Self-pay | Source: Ambulatory Visit | Attending: Internal Medicine

## 2015-08-16 ENCOUNTER — Ambulatory Visit (HOSPITAL_BASED_OUTPATIENT_CLINIC_OR_DEPARTMENT_OTHER): Payer: Medicare Other

## 2015-08-16 ENCOUNTER — Encounter (HOSPITAL_COMMUNITY): Payer: Self-pay

## 2015-08-16 DIAGNOSIS — I340 Nonrheumatic mitral (valve) insufficiency: Secondary | ICD-10-CM

## 2015-08-16 DIAGNOSIS — Z950 Presence of cardiac pacemaker: Secondary | ICD-10-CM | POA: Insufficient documentation

## 2015-08-16 DIAGNOSIS — Z8673 Personal history of transient ischemic attack (TIA), and cerebral infarction without residual deficits: Secondary | ICD-10-CM | POA: Insufficient documentation

## 2015-08-16 DIAGNOSIS — Z955 Presence of coronary angioplasty implant and graft: Secondary | ICD-10-CM | POA: Diagnosis not present

## 2015-08-16 DIAGNOSIS — I480 Paroxysmal atrial fibrillation: Secondary | ICD-10-CM | POA: Insufficient documentation

## 2015-08-16 DIAGNOSIS — I10 Essential (primary) hypertension: Secondary | ICD-10-CM | POA: Insufficient documentation

## 2015-08-16 DIAGNOSIS — I4891 Unspecified atrial fibrillation: Secondary | ICD-10-CM

## 2015-08-16 DIAGNOSIS — E119 Type 2 diabetes mellitus without complications: Secondary | ICD-10-CM | POA: Diagnosis not present

## 2015-08-16 DIAGNOSIS — I2510 Atherosclerotic heart disease of native coronary artery without angina pectoris: Secondary | ICD-10-CM | POA: Insufficient documentation

## 2015-08-16 DIAGNOSIS — Z87891 Personal history of nicotine dependence: Secondary | ICD-10-CM | POA: Insufficient documentation

## 2015-08-16 DIAGNOSIS — I482 Chronic atrial fibrillation: Secondary | ICD-10-CM | POA: Insufficient documentation

## 2015-08-16 HISTORY — PX: TEE WITHOUT CARDIOVERSION: SHX5443

## 2015-08-16 LAB — GLUCOSE, CAPILLARY: Glucose-Capillary: 173 mg/dL — ABNORMAL HIGH (ref 65–99)

## 2015-08-16 SURGERY — ECHOCARDIOGRAM, TRANSESOPHAGEAL
Anesthesia: Moderate Sedation

## 2015-08-16 MED ORDER — BUTAMBEN-TETRACAINE-BENZOCAINE 2-2-14 % EX AERO
INHALATION_SPRAY | CUTANEOUS | Status: DC | PRN
  Administered 2015-08-16: 2 via TOPICAL

## 2015-08-16 MED ORDER — DIPHENHYDRAMINE HCL 50 MG/ML IJ SOLN
INTRAMUSCULAR | Status: AC
  Filled 2015-08-16: qty 1

## 2015-08-16 MED ORDER — SODIUM CHLORIDE 0.9 % IV SOLN
INTRAVENOUS | Status: DC
  Administered 2015-08-16: 11:00:00 via INTRAVENOUS

## 2015-08-16 MED ORDER — FENTANYL CITRATE (PF) 100 MCG/2ML IJ SOLN
INTRAMUSCULAR | Status: DC | PRN
  Administered 2015-08-16 (×2): 25 ug via INTRAVENOUS

## 2015-08-16 MED ORDER — LIDOCAINE VISCOUS 2 % MT SOLN
OROMUCOSAL | Status: AC
  Filled 2015-08-16: qty 15

## 2015-08-16 MED ORDER — MIDAZOLAM HCL 5 MG/ML IJ SOLN
INTRAMUSCULAR | Status: AC
  Filled 2015-08-16: qty 2

## 2015-08-16 MED ORDER — FENTANYL CITRATE (PF) 100 MCG/2ML IJ SOLN
INTRAMUSCULAR | Status: AC
  Filled 2015-08-16: qty 2

## 2015-08-16 MED ORDER — MIDAZOLAM HCL 10 MG/2ML IJ SOLN
INTRAMUSCULAR | Status: DC | PRN
  Administered 2015-08-16 (×2): 2 mg via INTRAVENOUS

## 2015-08-16 MED ORDER — LIDOCAINE VISCOUS 2 % MT SOLN
OROMUCOSAL | Status: DC | PRN
  Administered 2015-08-16: 1 via OROMUCOSAL

## 2015-08-16 NOTE — CV Procedure (Signed)
[name]   TRANSESOPHAGEAL ECHOCARDIOGRAM (TEE) NOTE  - WATCHMAN ATRIAL APPENDAGE CLOSURE DEVICE EVALUATION    INDICATIONS: atrial fibrillation  PROCEDURE:   Informed consent was obtained prior to the procedure. The risks, benefits and alternatives for the procedure were discussed and the patient comprehended these risks.  Risks include, but are not limited to, cough, sore throat, vomiting, nausea, somnolence, esophageal and stomach trauma or perforation, bleeding, low blood pressure, aspiration, pneumonia, infection, trauma to the teeth and death.    After a procedural time-out, the patient was given 4 mg versed and 50 mcg fentanyl for moderate sedation.  The oropharynx was anesthetized 10 cc of topical 1% viscous lidocaine and 2 cetacaine sprays.  The transesophageal probe was inserted in the esophagus and stomach without difficulty and multiple views were obtained.  The patient was kept under observation until the patient left the procedure room.  The patient left the procedure room in stable condition.   Agitated microbubble saline contrast was administered.  COMPLICATIONS:    There were no immediate complications.  Findings:  1. LEFT VENTRICLE: The left ventricular wall thickness is mildly increased.  The left ventricular cavity is normal in size. Wall motion is normal.  LVEF is 55-60%.  2. RIGHT VENTRICLE:  The right ventricle is normal in structure and function without any thrombus or masses.  Pacemaker wires are noted.  3. LEFT ATRIUM:  The left atrium is dilated in size without any thrombus or masses.  There is not spontaneous echo contrast ("smoke") in the left atrium consistent with a low flow state.  4. LEFT ATRIAL APPENDAGE:  The left atrial appendage is free of any thrombus or masses. The appendage has single lobes and a cauliflower morphology.Pulse doppler indicates moderate flow in the appendage. The LUPV is oriented coaxial and the coumadin ridge measures about 21 mm to the  left atrial appendage. Pertinent measurements in the appendage are as follows:   0 degrees 45 degrees 90 degrees 135 degrees  LAA Ostium (mm) 15 20  19 24   LAA Length (mm) 15 24 31 18    3D measurements were obtained, which demonstrate an oval ostium measuring 20 x 26 mm.    *LAA size should be between >17 mm and <31 mm, length generally greater than ostial length. Measure 2 cm from the coumadin ridge to ostium and across from left coronary artery.  .      5. ATRIAL SEPTUM:  The atrial septum appears intact and is free of thrombus and/or masses.  There is no evidence for interatrial shunting by color doppler and saline microbubble.  6. RIGHT ATRIUM:  The right atrium is normal in size and function without any thrombus or masses. Pacemaker wires are noted with a small, filamentous mass on one pacer wire, suggestive of thrombus.  7. MITRAL VALVE:  The mitral valve is normal in structure and function with Mild regurgitation.  There were no vegetations or stenosis.  8. AORTIC VALVE:  The aortic valve is trileaflet, normal in structure and function with trivial regurgitation.  There were no vegetations or stenosis  9. TRICUSPID VALVE:  The tricuspid valve is normal in structure and function with Mild regurgitation.  There were no vegetations or stenosis  10.  PULMONIC VALVE:  The pulmonic valve is normal in structure and function with trivial regurgitation.  There were no vegetations or stenosis.   11. AORTIC ARCH, ASCENDING AND DESCENDING AORTA:  There was no Ron Parker et. Al, 1992) atherosclerosis of the ascending aorta, aortic arch,  or proximal descending aorta.  12. PULMONARY VEINS: Anomalous pulmonary venous return was not noted.  13. PERICARDIUM: The pericardium appeared normal and non-thickened.  There is no pericardial effusion.  IMPRESSION:   1. No LAA thrombus - cauliflower shape 2. Negative for PFO 3. Right-sided pacer wires noted with a small, filamentous thrombus noted adherent  to the wire 4. Mild to moderate MR, mild TR, trivial AI/PI 5. Normal LV function  RECOMMENDATIONS:   (available Watchman sizes: 21, 24, 27, 30, 33 mm)   1.  Based on the largest ostial measurement, a 27 mm device size is recommended.  Time Spent Directly with the Patient:  60 minutes   Pixie Casino, MD, [facility] Attending Cardiologist [facility]  08/16/2015, 11:19 AM

## 2015-08-16 NOTE — H&P (Signed)
Expand All Collapse All      Primary Care: Dr Job Founds in [place] Primary Cardiologist: none  Heather May is a 69 y.o. female with a h/o bradycardia sp PPM Corporate investment banker) in [place] for presyncope/ falls and bradycardia who presents today to establish care in the Electrophysiology device clinic. She recently moved to [place] from [place] to be closer to family. She has persistent afib. She V paces 82%. She reports having afib for "years" and is typical asymptomatic. She was previously on coumadin but had ICH. She has since been treated with ASA and plavix. She is primarily limited by knee pain.  Today, she denies symptoms of palpitations, chest pain, shortness of breath, orthopnea, PND, lower extremity edema, dizziness, presyncope, syncope, or neurologic sequela. The patientis tolerating medications without difficulties and is otherwise without complaint today.   Past Medical History  Diagnosis Date  . CAD (coronary artery disease) 08/15/13  . Seizure 2014    occured in setting of [place]  . Hypertension   . GERD (gastroesophageal reflux disease)   . Permanent atrial fibrillation     chads2vasc score is at least 5  . Diabetes mellitus without complication   . Essential tremor   . Transient ischemic attack   . Subarachnoid hemorrhage   . Spinal stenosis   . Diverticulosis   . PAD (peripheral artery disease)   . Pulmonary embolism   . Percutaneous transluminal coronary angioplasty status   . Long-term (current) use of anticoagulants   . Hyperlipidemia     mixed  . History of long-term use of multiple prescription drugs   . Sick sinus syndrome   . Pulmonary fibrosis    Past Surgical History  Procedure Laterality Date  . Cardiac catheterization  10/14    PCI   . Basal cell carcinoma excision    . Cesarean section N/A 1978    x 1  . Hernia repair  06/2010  .  Abdominal hysterectomy  2004  . Removal of big toe nail Bilateral   . Carpel tunnel surgery Bilateral 2006/ 2010  . Titanium pins inserted metatarsol bone  02/2012    2 pins placed   . Pacemaker insertion  03/05/15    [place] DR implanted in [place], [place]    Social History   Social History  . Marital Status: Widowed    Spouse Name: N/A  . Number of Children: N/A  . Years of Education: N/A   Occupational History  . Not on file.   Social History Main Topics  . Smoking status: Former Smoker    Quit date: 07/06/2009  . Smokeless tobacco: Not on file     Comment: didnt smoke much  . Alcohol Use: 0.0 oz/week    0 Standard drinks or equivalent per week     Comment: very infrequently  . Drug Use: No  . Sexual Activity: Not on file   Other Topics Concern  . Not on file   Social History Narrative   Pt recently moved to [facility] [place]. She now lived with her daughter.   Here today with her partner.   Previously worked as a Teacher, music.    Family History  Problem Relation Age of Onset  . Stroke Mother   . Epilepsy Mother   . Stroke Father   . Heart attack Father   . Atrial fibrillation Sister   . Atrial fibrillation Brother   . Diabetes Mellitus II Brother  Allergies  Allergen Reactions  . Sulfa Antibiotics     RASH    Current Outpatient Prescriptions  Medication Sig Dispense Refill  . aspirin 81 MG tablet Take 81 mg by mouth daily.    . benazepril (LOTENSIN) 10 MG tablet Take 10 mg by mouth daily.    . enalapril (VASOTEC) 10 MG tablet Take 10 mg by mouth daily.    [name] glipiZIDE (GLUCOTROL) 10 MG tablet Take 10 mg by mouth daily before breakfast.    . glipiZIDE (GLUCOTROL) 5 MG tablet Take 5 mg by mouth at bedtime.     . lansoprazole (PREVACID) 15  MG capsule Take 15 mg by mouth daily at 12 noon.    . levETIRAcetam (KEPPRA) 500 MG tablet Take 500 mg by mouth 2 (two) times daily.    [name] lovastatin (MEVACOR) 40 MG tablet Take 40 mg by mouth at bedtime.    . Multiple Vitamin (MULTIVITAMIN) tablet Take 1 tablet by mouth daily.    . nitroGLYCERIN (NITROSTAT) 0.4 MG SL tablet Place 0.4 mg under the tongue every 5 (five) minutes as needed for chest pain.    [name] ondansetron (ZOFRAN) 4 MG tablet Take 4 mg by mouth every 8 (eight) hours as needed for nausea or vomiting.     No current facility-administered medications for this visit.    ROS- all systems are reviewed and negative except as per HPI  Physical Exam: Filed Vitals:   07/07/15 1533  BP: 122/70  Pulse: 70  Height: 5\' 6"  (1.676 m)  Weight: 94.348 kg (208 lb)    GEN- The patient is well appearing, alert and oriented x 3 today.  Head- normocephalic, atraumatic Eyes- Sclera clear, conjunctiva pink Ears- hearing intact Oropharynx- clear Neck- supple, no JVP Lymph- no cervical lymphadenopathy Lungs- Clear to ausculation bilaterally, normal work of breathing Chest- pacemaker pocket is well healed Heart- Regular rate and rhythm, no murmurs, rubs or gallops, PMI not laterally displaced GI- soft, NT, ND, + BS Extremities- no clubbing, cyanosis, or edema MS- no significant deformity or atrophy Skin- no rash or lesion Psych- euthymic mood, full affect Neuro- strength and sensation are intact  Pacemaker interrogation- reviewed in detail today, See PACEART report  Assessment and Plan:  1. Symptomatic bradycardia Normal pacemaker function See Pace Art report No changes today  2. Permanent atrial fibrillation Not a candidate for long term coumadin though her chads2vasc score is at least 5. Today, we discussed watchman (LAA closure device) procedure at length. She is very interested in this approach for long term stroke prevention. She  is worried about stroke, but is also worried given her h/o ICH about long term anticoagulation. She will need TEE prior to proceeding as well as an independent assessment by one of our general cardiologists to discuss risks/ benefits of the procedure. Hopefully, we could proceed in the next mont or so. She will be contact by our Biochemist, clinical.   3. CAD No ischemic symptoms No changes Will need to establish with general cardiology  4. HTN Stable No change required today  She will be contacted by our [facility] program coordinator and will be referred to general cardiology. She will follow-up with EP NP.        Agree with the above findings. No changes noted since last office visit.   Heather May has presented today for their planned procedure. The various methods of treatment have been discussed with the patient and family. After consideration of risks, benefits and other options for  treatment, the patient has consented to the procedure.  The patients' outpatient history has been reviewed, patient examined, and no change in status from most recent office note within the past 30 days. I have reviewed the patients' chart and labs and will proceed as planned. Questions were answered to the patient's satisfaction.   Pixie Casino, MD, [facility] Attending Cardiologist [place] [facility]born 08/16/2015, 10:56 AM

## 2015-08-16 NOTE — Progress Notes (Signed)
  Echocardiogram Echocardiogram Transesophageal has been performed.  Joelene Millin 08/16/2015, 12:21 PM

## 2015-08-16 NOTE — Discharge Instructions (Signed)
Transesophageal Echocardiogram °Transesophageal echocardiography (TEE) is a special type of test that produces images of the heart by using sound waves (echocardiogram). This type of echocardiography can obtain better images of the heart than standard echocardiography. TEE is done by passing a flexible tube down the esophagus. The heart is located in front of the esophagus. Because the heart and esophagus are close to one another, your health care provider can take very clear, detailed pictures of the heart via ultrasound waves. °TEE may be done: °· If your health care provider needs more information based on standard echocardiography findings. °· If you had a stroke. This might have happened because a clot formed in your heart. TEE can visualize different areas of the heart and check for clots. °· To check valve anatomy and function. °· To check for infection on the inside of your heart (endocarditis). °· To evaluate the dividing wall (septum) of the heart and presence of a hole that did not close after birth (patent foramen ovale or atrial septal defect). °· To help diagnose a tear in the wall of the aorta (aortic dissection). °· During cardiac valve surgery. This allows the surgeon to assess the valve repair before closing the chest. °· During a variety of other cardiac procedures to guide positioning of catheters. °· Sometimes before a cardioversion, which is a shock to convert heart rhythm back to normal. °LET YOUR HEALTH CARE PROVIDER KNOW ABOUT:  °· Any allergies you have. °· All medicines you are taking, including vitamins, herbs, eye drops, creams, and over-the-counter medicines. °· Previous problems you or members of your family have had with the use of anesthetics. °· Any blood disorders you have. °· Previous surgeries you have had. °· Medical conditions you have. °· Swallowing difficulties. °· An esophageal obstruction. °RISKS AND COMPLICATIONS  °Generally, TEE is a safe procedure. However, as with any  procedure, complications can occur. Possible complications include an esophageal tear (rupture). °BEFORE THE PROCEDURE  °· Do not eat or drink for 6 hours before the procedure or as directed by your health care provider. °· Arrange for someone to drive you home after the procedure. Do not drive yourself home. During the procedure, you will be given medicines that can continue to make you feel drowsy and can impair your reflexes. °· An IV access tube will be started in the arm. °PROCEDURE  °· A medicine to help you relax (sedative) will be given through the IV access tube. °· A medicine may be sprayed or gargled to numb the back of the throat. °· Your blood pressure, heart rate, and breathing (vital signs) will be monitored during the procedure. °· The TEE probe is a long, flexible tube. The tip of the probe is placed into the back of the mouth, and you will be asked to swallow. This helps to pass the tip of the probe into the esophagus. Once the tip of the probe is in the correct area, your health care provider can take pictures of the heart. °· TEE is usually not a painful procedure. You may feel the probe press against the back of the throat. The probe does not enter the trachea and does not affect your breathing. °AFTER THE PROCEDURE  °· You will be in bed, resting, until you have fully returned to consciousness. °· When you first awaken, your throat may feel slightly sore and will probably still feel numb. This will improve slowly over time. °· You will not be allowed to eat or drink until it   is clear that the numbness has improved.  Once you have been able to drink, urinate, and sit on the edge of the bed without feeling sick to your stomach (nausea) or dizzy, you may be cleared to go home.  You should have a friend or family member with you for the next 24 hours after your procedure.   This information is not intended to replace advice given to you by your health care provider. Make sure you discuss any  questions you have with your health care provider.   Document Released: 12/30/2002 Document Revised: 10/14/2013 Document Reviewed: 04/10/2013 Elsevier Interactive Patient Education [employer].

## 2015-08-17 ENCOUNTER — Encounter: Payer: Self-pay | Admitting: Internal Medicine

## 2015-08-17 ENCOUNTER — Encounter (HOSPITAL_COMMUNITY): Payer: Self-pay | Admitting: Internal Medicine

## 2015-08-18 ENCOUNTER — Telehealth: Payer: Self-pay | Admitting: Nurse Practitioner

## 2015-08-18 NOTE — Telephone Encounter (Signed)
Left message for patient to call.  TEE images reviewed by Watchman and Dr Rayann Heman.  LAA may be too large for Watchman to fit, but agree the procedure is worth trying (success will depend on if we can get enough compression on device at time of implant).  I would like to schedule patient for Thursday, 11-3 3rd case.  I have left a message for her to call.  I am out of the office until Tuesday and have asked the patient to ask for Janan Halter, RN  Chanetta Marshall, NP 08/18/2015 6:26 PM

## 2015-08-18 NOTE — Telephone Encounter (Signed)
Heather May and Heather May are both off 10/27. If patient calls back tomorrow, please give her my cell number to call to discuss [phone]).  If she calls back Friday, please route to [employer]

## 2015-08-19 NOTE — Telephone Encounter (Signed)
Spoke with patient and explained TEE findings, reviewed Watchman procedure, potential complications, expected recovery.  The patient would like to discuss with her family.  She is aware that we can do Thursday 11/3 if this works for her and she would like to proceed.  I will call back Monday 10/31 to discuss again.  Chanetta Marshall, NP 08/19/2015 12:12 PM

## 2015-08-23 NOTE — Telephone Encounter (Signed)
Spoke with patient. She would like to proceed with Watchman on Thursday. Scheduled for 3rd case. Instructions reviewed.  I have asked patient to hold Plavix morning of procedure.  She will need general cardiology follow up long term. Will ask Dr Marlou Porch to see the patient Thursday morning in Short Stay to evaluate for Watchman as well as establish for general cardiology (I have spoken with Dr Marlou Porch this morning who is willing to be general cardiologist).   Chanetta Marshall, NP 08/23/2015 10:42 AM

## 2015-08-24 ENCOUNTER — Other Ambulatory Visit: Payer: Self-pay | Admitting: Nurse Practitioner

## 2015-08-26 ENCOUNTER — Ambulatory Visit (HOSPITAL_COMMUNITY)
Admission: RE | Admit: 2015-08-26 | Discharge: 2015-08-26 | Disposition: A | Discharge status: Home / Self Care | Payer: Medicare Other | Source: Ambulatory Visit | Admitting: Internal Medicine

## 2015-08-26 ENCOUNTER — Encounter (HOSPITAL_COMMUNITY): Payer: Self-pay | Admitting: Certified Registered Nurse Anesthetist

## 2015-08-26 ENCOUNTER — Ambulatory Visit (HOSPITAL_COMMUNITY): Payer: Medicare Other | Admitting: Certified Registered"

## 2015-08-26 ENCOUNTER — Inpatient Hospital Stay (HOSPITAL_COMMUNITY)
Admission: RE | Admit: 2015-08-26 | Discharge: 2015-08-27 | DRG: 274 | Disposition: A | Discharge status: Home / Self Care | Payer: Medicare Other | Source: Ambulatory Visit | Attending: Internal Medicine | Admitting: Internal Medicine

## 2015-08-26 ENCOUNTER — Encounter (HOSPITAL_COMMUNITY)
Admission: RE | Disposition: A | Discharge status: Home / Self Care | Payer: Self-pay | Source: Ambulatory Visit | Attending: Internal Medicine

## 2015-08-26 ENCOUNTER — Other Ambulatory Visit: Payer: Self-pay | Admitting: Cardiology

## 2015-08-26 DIAGNOSIS — I482 Chronic atrial fibrillation: Secondary | ICD-10-CM | POA: Diagnosis present

## 2015-08-26 DIAGNOSIS — I2510 Atherosclerotic heart disease of native coronary artery without angina pectoris: Secondary | ICD-10-CM | POA: Diagnosis not present

## 2015-08-26 DIAGNOSIS — I739 Peripheral vascular disease, unspecified: Secondary | ICD-10-CM | POA: Diagnosis not present

## 2015-08-26 DIAGNOSIS — E1151 Type 2 diabetes mellitus with diabetic peripheral angiopathy without gangrene: Secondary | ICD-10-CM | POA: Diagnosis not present

## 2015-08-26 DIAGNOSIS — Z87891 Personal history of nicotine dependence: Secondary | ICD-10-CM | POA: Diagnosis not present

## 2015-08-26 DIAGNOSIS — Z8673 Personal history of transient ischemic attack (TIA), and cerebral infarction without residual deficits: Secondary | ICD-10-CM | POA: Diagnosis not present

## 2015-08-26 DIAGNOSIS — E782 Mixed hyperlipidemia: Secondary | ICD-10-CM | POA: Diagnosis present

## 2015-08-26 DIAGNOSIS — Z950 Presence of cardiac pacemaker: Secondary | ICD-10-CM | POA: Diagnosis not present

## 2015-08-26 DIAGNOSIS — Z006 Encounter for examination for normal comparison and control in clinical research program: Secondary | ICD-10-CM | POA: Diagnosis not present

## 2015-08-26 DIAGNOSIS — I10 Essential (primary) hypertension: Secondary | ICD-10-CM | POA: Diagnosis present

## 2015-08-26 DIAGNOSIS — Z7982 Long term (current) use of aspirin: Secondary | ICD-10-CM | POA: Diagnosis not present

## 2015-08-26 DIAGNOSIS — I495 Sick sinus syndrome: Secondary | ICD-10-CM | POA: Diagnosis present

## 2015-08-26 DIAGNOSIS — Z9861 Coronary angioplasty status: Secondary | ICD-10-CM

## 2015-08-26 DIAGNOSIS — I481 Persistent atrial fibrillation: Principal | ICD-10-CM | POA: Diagnosis present

## 2015-08-26 DIAGNOSIS — I4891 Unspecified atrial fibrillation: Secondary | ICD-10-CM | POA: Diagnosis not present

## 2015-08-26 DIAGNOSIS — K219 Gastro-esophageal reflux disease without esophagitis: Secondary | ICD-10-CM | POA: Diagnosis present

## 2015-08-26 DIAGNOSIS — J8410 Pulmonary fibrosis, unspecified: Secondary | ICD-10-CM | POA: Diagnosis present

## 2015-08-26 HISTORY — PX: LEFT ATRIAL APPENDAGE OCCLUSION: SHX173A

## 2015-08-26 LAB — CBC
HCT: 38 % (ref 36.0–46.0)
Hemoglobin: 12.8 g/dL (ref 12.0–15.0)
MCH: 31.8 pg (ref 26.0–34.0)
MCHC: 33.7 g/dL (ref 30.0–36.0)
MCV: 94.3 fL (ref 78.0–100.0)
Platelets: 159 10*3/uL (ref 150–400)
RBC: 4.03 MIL/uL (ref 3.87–5.11)
RDW: 13 % (ref 11.5–15.5)
WBC: 5.2 10*3/uL (ref 4.0–10.5)

## 2015-08-26 LAB — POCT ACTIVATED CLOTTING TIME
Activated Clotting Time: 257 seconds
Activated Clotting Time: 140 seconds

## 2015-08-26 LAB — BASIC METABOLIC PANEL
Anion gap: 8 (ref 5–15)
BUN: 14 mg/dL (ref 6–20)
CO2: 24 mmol/L (ref 22–32)
Calcium: 9.6 mg/dL (ref 8.9–10.3)
Chloride: 107 mmol/L (ref 101–111)
Creatinine, Ser: 0.77 mg/dL (ref 0.44–1.00)
GFR calc Af Amer: >60 mL/min (ref >60)
GFR calc non Af Amer: >60 mL/min (ref >60)
Glucose, Bld: 194 mg/dL — ABNORMAL HIGH (ref 65–99)
Potassium: 4.6 mmol/L (ref 3.5–5.1)
Sodium: 139 mmol/L (ref 135–145)

## 2015-08-26 LAB — GLUCOSE, CAPILLARY: Glucose-Capillary: 138 mg/dL — ABNORMAL HIGH (ref 65–99)

## 2015-08-26 LAB — TYPE AND SCREEN
ABO/RH(D): O POS
Antibody Screen: NEGATIVE

## 2015-08-26 LAB — MRSA PCR SCREENING: MRSA by PCR: NEGATIVE

## 2015-08-26 LAB — ABO/RH: ABO/RH(D): O POS

## 2015-08-26 SURGERY — LEFT ATRIAL APPENDAGE OCCLUSION
Anesthesia: General

## 2015-08-26 MED ORDER — LIDOCAINE HCL (PF) 1 % IJ SOLN
INTRAMUSCULAR | Status: AC
  Filled 2015-08-26: qty 30

## 2015-08-26 MED ORDER — OXYCODONE HCL 5 MG/5ML PO SOLN: 5.0000 mg | Freq: Once | ORAL | Status: DC | PRN

## 2015-08-26 MED ORDER — ROCURONIUM BROMIDE 100 MG/10ML IV SOLN
INTRAVENOUS | Status: DC | PRN
  Administered 2015-08-26: 50 mg via INTRAVENOUS

## 2015-08-26 MED ORDER — HEPARIN SODIUM (PORCINE) 1000 UNIT/ML IJ SOLN
INTRAMUSCULAR | Status: DC | PRN
  Administered 2015-08-26: 10000 [IU] via INTRAVENOUS

## 2015-08-26 MED ORDER — SODIUM CHLORIDE 0.9 % IV SOLN
INTRAVENOUS | Status: DC
  Administered 2015-08-26: 18:00:00 via INTRAVENOUS

## 2015-08-26 MED ORDER — WARFARIN - PHYSICIAN DOSING INPATIENT: Freq: Every day | Status: DC

## 2015-08-26 MED ORDER — PRAVASTATIN SODIUM 40 MG PO TABS
40.0000 mg | ORAL_TABLET | Freq: Every day | ORAL | Status: DC
  Administered 2015-08-26: 40 mg via ORAL
  Filled 2015-08-26: qty 1

## 2015-08-26 MED ORDER — FENTANYL CITRATE (PF) 100 MCG/2ML IJ SOLN
INTRAMUSCULAR | Status: DC | PRN
  Administered 2015-08-26: 100 ug via INTRAVENOUS
  Administered 2015-08-26 (×2): 50 ug via INTRAVENOUS

## 2015-08-26 MED ORDER — WARFARIN SODIUM 5 MG PO TABS
5.0000 mg | ORAL_TABLET | Freq: Once | ORAL | Status: AC
  Administered 2015-08-26: 5 mg via ORAL
  Filled 2015-08-26: qty 1

## 2015-08-26 MED ORDER — ASPIRIN EC 81 MG PO TBEC
81.0000 mg | DELAYED_RELEASE_TABLET | Freq: Every day | ORAL | Status: DC
  Administered 2015-08-27: 81 mg via ORAL
  Filled 2015-08-26: qty 1

## 2015-08-26 MED ORDER — MIDAZOLAM HCL 5 MG/5ML IJ SOLN
INTRAMUSCULAR | Status: DC | PRN
  Administered 2015-08-26: 2 mg via INTRAVENOUS

## 2015-08-26 MED ORDER — ACETAMINOPHEN 160 MG/5ML PO SOLN: 325.0000 mg | ORAL | Status: DC | PRN

## 2015-08-26 MED ORDER — OXYCODONE HCL 5 MG PO TABS
5.0000 mg | ORAL_TABLET | Freq: Once | ORAL | Status: DC | PRN
  Filled 2015-08-26: qty 1

## 2015-08-26 MED ORDER — HEPARIN (PORCINE) IN NACL 2-0.9 UNIT/ML-% IJ SOLN
INTRAMUSCULAR | Status: AC
  Filled 2015-08-26: qty 500

## 2015-08-26 MED ORDER — SODIUM CHLORIDE 0.9 % IJ SOLN: 3.0000 mL | INTRAMUSCULAR | Status: DC | PRN

## 2015-08-26 MED ORDER — CEFAZOLIN SODIUM-DEXTROSE 2-3 GM-% IV SOLR
2.0000 g | INTRAVENOUS | Status: AC
  Administered 2015-08-26: 2 g via INTRAVENOUS

## 2015-08-26 MED ORDER — INSULIN ASPART 100 UNIT/ML ~~LOC~~ SOLN
0.0000 [IU] | Freq: Three times a day (TID) | SUBCUTANEOUS | Status: DC
  Administered 2015-08-27: 5 [IU] via SUBCUTANEOUS

## 2015-08-26 MED ORDER — PANTOPRAZOLE SODIUM 20 MG PO TBEC
20.0000 mg | DELAYED_RELEASE_TABLET | Freq: Every day | ORAL | Status: DC
  Administered 2015-08-27: 20 mg via ORAL
  Filled 2015-08-26: qty 1

## 2015-08-26 MED ORDER — HEPARIN SODIUM (PORCINE) 1000 UNIT/ML IJ SOLN
INTRAMUSCULAR | Status: AC
  Filled 2015-08-26: qty 1

## 2015-08-26 MED ORDER — LIDOCAINE HCL (PF) 1 % IJ SOLN
INTRAMUSCULAR | Status: DC | PRN
  Administered 2015-08-26: 14:00:00

## 2015-08-26 MED ORDER — LEVETIRACETAM 500 MG PO TABS
500.0000 mg | ORAL_TABLET | Freq: Two times a day (BID) | ORAL | Status: DC
  Administered 2015-08-26 – 2015-08-27 (×2): 500 mg via ORAL
  Filled 2015-08-26 (×2): qty 1

## 2015-08-26 MED ORDER — ACETAMINOPHEN 325 MG PO TABS: 650.0000 mg | ORAL_TABLET | ORAL | Status: DC | PRN

## 2015-08-26 MED ORDER — CEFAZOLIN SODIUM-DEXTROSE 2-3 GM-% IV SOLR
INTRAVENOUS | Status: AC
  Filled 2015-08-26: qty 50

## 2015-08-26 MED ORDER — HYDROCODONE-ACETAMINOPHEN 5-325 MG PO TABS: 1.0000 | ORAL_TABLET | ORAL | Status: DC | PRN

## 2015-08-26 MED ORDER — LACTATED RINGERS IV SOLN
INTRAVENOUS | Status: DC | PRN
  Administered 2015-08-26 (×2): via INTRAVENOUS

## 2015-08-26 MED ORDER — ONDANSETRON HCL 4 MG/2ML IJ SOLN
4.0000 mg | Freq: Four times a day (QID) | INTRAMUSCULAR | Status: DC | PRN
  Administered 2015-08-26 – 2015-08-27 (×2): 4 mg via INTRAVENOUS
  Filled 2015-08-26 (×2): qty 2

## 2015-08-26 MED ORDER — PHENYLEPHRINE HCL 10 MG/ML IJ SOLN
10.0000 mg | INTRAVENOUS | Status: DC | PRN
  Administered 2015-08-26: 30 ug/min via INTRAVENOUS

## 2015-08-26 MED ORDER — IOHEXOL 350 MG/ML SOLN
INTRAVENOUS | Status: DC | PRN
  Administered 2015-08-26: 60 mL via INTRAVENOUS

## 2015-08-26 MED ORDER — HEPARIN SODIUM (PORCINE) 1000 UNIT/ML IJ SOLN
INTRAMUSCULAR | Status: DC | PRN
  Administered 2015-08-26: 2000 [IU] via INTRAVENOUS

## 2015-08-26 MED ORDER — ACETAMINOPHEN 325 MG PO TABS
325.0000 mg | ORAL_TABLET | ORAL | Status: DC | PRN
  Administered 2015-08-26 – 2015-08-27 (×3): 650 mg via ORAL
  Filled 2015-08-26: qty 1
  Filled 2015-08-26 (×3): qty 2

## 2015-08-26 MED ORDER — PROTAMINE SULFATE 10 MG/ML IV SOLN
INTRAVENOUS | Status: DC | PRN
  Administered 2015-08-26 (×2): 10 mg via INTRAVENOUS

## 2015-08-26 MED ORDER — SODIUM CHLORIDE 0.9 % IV SOLN: 250.0000 mL | INTRAVENOUS | Status: DC | PRN

## 2015-08-26 MED ORDER — LIDOCAINE HCL (CARDIAC) 20 MG/ML IV SOLN
INTRAVENOUS | Status: DC | PRN
  Administered 2015-08-26: 30 mg via INTRAVENOUS

## 2015-08-26 MED ORDER — SODIUM CHLORIDE 0.9 % IJ SOLN
3.0000 mL | Freq: Two times a day (BID) | INTRAMUSCULAR | Status: DC
  Administered 2015-08-27: 3 mL via INTRAVENOUS

## 2015-08-26 MED ORDER — SUGAMMADEX SODIUM 200 MG/2ML IV SOLN
INTRAVENOUS | Status: DC | PRN
  Administered 2015-08-26: 100 mg via INTRAVENOUS

## 2015-08-26 MED ORDER — FENTANYL CITRATE (PF) 100 MCG/2ML IJ SOLN: 25.0000 ug | INTRAMUSCULAR | Status: DC | PRN

## 2015-08-26 MED ORDER — PROPOFOL 10 MG/ML IV BOLUS
INTRAVENOUS | Status: DC | PRN
  Administered 2015-08-26: 120 mg via INTRAVENOUS
  Administered 2015-08-26: 80 mg via INTRAVENOUS

## 2015-08-26 MED ORDER — ONDANSETRON HCL 4 MG/2ML IJ SOLN
INTRAMUSCULAR | Status: DC | PRN
  Administered 2015-08-26: 4 mg via INTRAVENOUS

## 2015-08-26 MED ORDER — LIDOCAINE HCL (PF) 1 % IJ SOLN
INTRAMUSCULAR | Status: DC | PRN
  Administered 2015-08-26: 10 mL

## 2015-08-26 MED ORDER — BENAZEPRIL HCL 20 MG PO TABS
10.0000 mg | ORAL_TABLET | Freq: Every day | ORAL | Status: DC
  Administered 2015-08-27: 10 mg via ORAL
  Filled 2015-08-26: qty 1

## 2015-08-26 SURGICAL SUPPLY — 16 items
BAG SNAP BAND KOVER 36X36 (MISCELLANEOUS) ×2 IMPLANT
BLANKET WARM UNDERBOD FULL ACC (MISCELLANEOUS) ×2 IMPLANT
CATH DIAG 6FR PIGTAIL (CATHETERS) ×2 IMPLANT
DILATOR VESSEL 38 20CM 8FR (INTRODUCER) ×2 IMPLANT
INTRODUCER SWARTZ SL2 8F (SHEATH) ×2 IMPLANT
KIT HEART LEFT (KITS) ×2 IMPLANT
NEEDLE TRANSEP BRK 71CM 407200 (NEEDLE) ×2 IMPLANT
PACK CARDIAC CATHETERIZATION (CUSTOM PROCEDURE TRAY) ×2 IMPLANT
SHEATH INTRO CHECKFLO 16F 13 (SHEATH) ×1 IMPLANT
SHEATH INTRO CHECKFLO 16F 13CM (SHEATH) ×1
SHIELD RADPAD SCOOP 12X17 (MISCELLANEOUS) ×2 IMPLANT
TRANSDUCER W/STOPCOCK (MISCELLANEOUS) ×2 IMPLANT
TUBING CIL FLEX 10 FLL-RA (TUBING) ×2 IMPLANT
WATCHMAN ACCESS DOUBLE CURVE (SHEATH) ×2 IMPLANT
WATCHMAN CLOSURE 33MM (Prosthesis & Implant Heart) ×2 IMPLANT
WIRE AMPLATZ WHISKJ .035X260CM (WIRE) ×2 IMPLANT

## 2015-08-26 NOTE — Anesthesia Preprocedure Evaluation (Signed)
Anesthesia Evaluation  Patient identified by MRN, date of birth, ID band Patient awake    Reviewed: Allergy & Precautions, NPO status , Patient's Chart, lab work & pertinent test results  History of Anesthesia Complications Negative for: history of anesthetic complications  Airway Mallampati: II  TM Distance: >3 FB Neck ROM: Full    Dental  (+) Teeth Intact   Pulmonary neg shortness of breath, neg sleep apnea, neg COPD, neg recent URI, former smoker, neg PE   breath sounds clear to auscultation       Cardiovascular hypertension, + CAD, + Past MI and + Peripheral Vascular Disease  (-) CHF + dysrhythmias Atrial Fibrillation  Rhythm:Irregular     Neuro/Psych Seizures -,  TIACVA, No Residual Symptoms negative psych ROS   GI/Hepatic Neg liver ROS, GERD  Medicated and Controlled,  Endo/Other  diabetes, Type 2, Oral Hypoglycemic AgentsMorbid obesity  Renal/GU negative Renal ROS     Musculoskeletal   Abdominal   Peds  Hematology negative hematology ROS (+)   Anesthesia Other Findings   Reproductive/Obstetrics                             Anesthesia Physical Anesthesia Plan  ASA: III  Anesthesia Plan: General   Post-op Pain Management:    Induction: Intravenous  Airway Management Planned: Oral ETT  Additional Equipment: Arterial line  Intra-op Plan:   Post-operative Plan: Extubation in OR  Informed Consent: I have reviewed the patients History and Physical, chart, labs and discussed the procedure including the risks, benefits and alternatives for the proposed anesthesia with the patient or authorized representative who has indicated his/her understanding and acceptance.   Dental advisory given  Plan Discussed with: CRNA and Surgeon  Anesthesia Plan Comments:         Anesthesia Quick Evaluation

## 2015-08-26 NOTE — Anesthesia Procedure Notes (Signed)
Procedure Name: Intubation Date/Time: 08/26/2015 1:37 PM Performed by: Layla Maw Pre-anesthesia Checklist: Patient identified, Patient being monitored, Timeout performed, Emergency Drugs available and Suction available Patient Re-evaluated:Patient Re-evaluated prior to inductionOxygen Delivery Method: Circle System Utilized Preoxygenation: Pre-oxygenation with 100% oxygen Intubation Type: IV induction Ventilation: Mask ventilation without difficulty Laryngoscope Size: Miller and 3 Grade View: Grade I Tube type: Oral Tube size: 7.0 mm Number of attempts: 1 Airway Equipment and Method: Stylet Placement Confirmation: ETT inserted through vocal cords under direct vision,  positive ETCO2 and breath sounds checked- equal and bilateral Secured at: 23 cm Tube secured with: Tape Dental Injury: Teeth and Oropharynx as per pre-operative assessment

## 2015-08-26 NOTE — Consult Note (Signed)
Admit date: 08/26/2015 Referring Physician  Dr. Rayann Heman Primary Physician PROVIDER NOT IN SYSTEM Primary Cardiologist  Dr. Rayann Heman EP Reason for Consultation  Watchman device, atrial fibrillation  HPI: 69 year old female with persistent atrial fibrillation, 82% V pacing with prior intracranial hemorrhage on Coumadin subsequently treated with aspirin Plavix, pacemaker [place] prior syncope/falls here for evaluation for Watchman device.  chads2vasc score is at least 5  She has previously spoken with Dr. Rayann Heman and is interested in long-term stroke prevention with watchman, left atrial appendage closure device.  Overall she feels well with no shortness of breath, chest pain, syncope, palpitations.   PMH:   Past Medical History  Diagnosis Date  . CAD (coronary artery disease) 08/15/13  . Seizure ([place]) 2014    occured in setting of [place]  . Hypertension   . GERD (gastroesophageal reflux disease)   . Permanent atrial fibrillation (HCC)     chads2vasc score is at least 5  . Diabetes mellitus without complication ([place])   . Essential tremor   . Transient ischemic attack   . Subarachnoid hemorrhage ([place])   . Spinal stenosis   . Diverticulosis   . PAD (peripheral artery disease) ([place])   . Pulmonary embolism ([place])   . Percutaneous transluminal coronary angioplasty status   . Long-term (current) use of anticoagulants   . Hyperlipidemia     mixed  . History of long-term use of multiple prescription drugs   . Sick sinus syndrome ([place])   . Pulmonary fibrosis (HCC)     PSH:   Past Surgical History  Procedure Laterality Date  . Cardiac catheterization  10/14    PCI   . Basal cell carcinoma excision    . Cesarean section N/A 1978    x 1  . Hernia repair  06/2010  . Abdominal hysterectomy  2004  . Removal of big toe nail Bilateral   . Carpel tunnel surgery Bilateral 2006/ 2010  . Titanium pins inserted metatarsol bone  02/2012    2 pins placed   . Pacemaker insertion   03/05/15    [place] DR implanted [place]  . Tee without cardioversion N/A 08/16/2015    Procedure: TRANSESOPHAGEAL ECHOCARDIOGRAM (TEE);  Surgeon: Pixie Casino, MD;  Location: [facility] ENDOSCOPY;  Service: Cardiovascular;  Laterality: N/A;   Allergies:  Sulfa antibiotics Prior to Admit Meds:   Prior to Admission medications   Medication Sig Start Date End Date Taking? Authorizing Provider  aspirin 81 MG tablet Take 81 mg by mouth daily.   Yes Historical Provider, MD  clopidogrel (PLAVIX) 75 MG tablet Take 75 mg by mouth daily. 07/25/15  Yes Historical Provider, MD  enalapril (VASOTEC) 10 MG tablet Take 10 mg by mouth daily.   Yes Historical Provider, MD  glipiZIDE (GLUCOTROL XL) 10 MG 24 hr tablet Take 10 mg by mouth daily with breakfast.   Yes Historical Provider, MD  lansoprazole (PREVACID) 15 MG capsule Take 15 mg by mouth daily at 12 noon.   Yes Historical Provider, MD  levETIRAcetam (KEPPRA) 500 MG tablet Take 500 mg by mouth 2 (two) times daily.   Yes Historical Provider, MD  lovastatin (MEVACOR) 40 MG tablet Take 40 mg by mouth at bedtime.   Yes Historical Provider, MD  Multiple Vitamin (MULTIVITAMIN) tablet Take 1 tablet by mouth daily.   Yes Historical Provider, MD  nitroGLYCERIN (NITROSTAT) 0.4 MG SL tablet Place 0.4 mg under the tongue every 5 (five) minutes as needed for chest pain.  Yes Historical Provider, MD  ondansetron (ZOFRAN) 4 MG tablet Take 4 mg by mouth every 8 (eight) hours as needed for nausea or vomiting.   Yes Historical Provider, MD  OVER THE COUNTER MEDICATION Take 1 Dose by mouth daily. OTC Joint Juice "1,500 mg of glucosamine plus chondroitin, Vitamin C and Vitamin D"   Yes Historical Provider, MD   Fam HX:    Family History  Problem Relation Age of Onset  . Stroke Mother   . Epilepsy Mother   . Stroke Father   . Heart attack Father   . Atrial fibrillation Sister   . Atrial fibrillation Brother   . Diabetes Mellitus II Brother      Social HX:    Social History   Social History  . Marital Status: Widowed    Spouse Name: N/A  . Number of Children: N/A  . Years of Education: N/A   Occupational History  . Not on file.   Social History Main Topics  . Smoking status: Former Smoker    Quit date: 07/06/2009  . Smokeless tobacco: Not on file     Comment: didnt smoke much  . Alcohol Use: 0.0 oz/week    0 Standard drinks or equivalent per week     Comment: very infrequently  . Drug Use: No  . Sexual Activity: Not on file   Other Topics Concern  . Not on file   Social History Narrative   Pt recently moved to [facility] [place].  She now lived with her daughter.   Here today with her partner.   Previously worked as a Teacher, music.     ROS:  All 11 ROS were addressed and are negative except what is stated in the HPI   Physical Exam: Blood pressure 135/66, pulse 62, temperature 97.9 F (36.6 C), temperature source Oral, resp. rate 18, height 5\' 6"  (1.676 m), weight 210 lb (95.255 kg), SpO2 99 %.   General: Well developed, well nourished, in no acute distress Head: Eyes PERRLA, No xanthomas.   Normal cephalic and atramatic  Lungs:   Clear bilaterally to auscultation and percussion. Normal respiratory effort. No wheezes, no rales. Heart:  regular with occasional ectopy  Pulses are 2+ & equal. No murmur, rubs, gallops.  No carotid bruit. No JVD.  No abdominal bruits. Pacemaker in place Abdomen: Bowel sounds are positive, abdomen soft and non-tender without masses. No hepatosplenomegaly. Overweight Msk:  Back normal. Normal strength and tone for age. Extremities:  No clubbing, cyanosis or edema.  DP +1 Neuro: Alert and oriented X 3, non-focal, MAE x 4 GU: Deferred Rectal: Deferred Psych:  Good affect, responds appropriately      Labs: Pending   ECHO, TEE - Left ventricle: There was mild concentric hypertrophy. Systolic function was normal. The estimated ejection fraction was in  the range of 55% to 60%. Wall motion was normal; there were noregional wall motion abnormalities. - Aortic valve: There was trivial regurgitation. - Mitral valve: Mildly thickened leaflets . There was mild to moderate regurgitation with 2 distinct jets. - Left atrium: The atrium was dilated. No evidence of thrombus inthe atrial cavity or appendage. Cauliflower shape. Greatestmeasurement is 24 mm. - Right ventricle: Pacer wire or catheter noted in right ventricle. - Right atrium: Pacer wire or catheter noted in right atrium. - Atrial septum: No defect or patent foramen ovale was identified. - Tricuspid valve: There was mild regurgitation. - Pulmonic valve: There was trivial regurgitation. - Pericardium, extracardiac: There was  no pericardial effusion.  Impressions:  - Watchman study. No LAA thrombus. Greatest ostial measurement is24 mm. Recommend a 27 mm device.  EKG:  Atrial fibrillation, V paced Personally viewed.   ASSESSMENT/PLAN:    69 year old female with persistent atrial fibrillation with increased risk of stroke CHADSVASc 5 deemed not a long-term anticoagulation candidate secondary to prior intracranial hemorrhage here for evaluation for left atrial appendage occluder device, watchman device.   - We have reviewed the risks and benefits of the procedure including stroke, MI, death, risk of pericardial effusion of approximately 1%. She is willing to proceed. Based upon her elevated risk score and prior intracranial hemorrhage, I do believe that she is a good candidate for watchman device to provide decreased stroke risk over long-term. TEE results reviewed. Understands use of coumadin post procedure short term.    - Pacemaker functioning properly  - Diabetes per primary team  - Lovastatin for hyperlipidemia  - Coronary disease has been stable, no chest pain, no ischemic symptoms  - Hypertension well controlled.    Candee Furbish, MD  08/26/2015  10:37 AM

## 2015-08-26 NOTE — H&P (View-Only) (Signed)
Admit date: 08/26/2015 Referring Physician  Dr. Rayann Heman Primary Physician PROVIDER NOT IN SYSTEM Primary Cardiologist  Dr. Rayann Heman EP Reason for Consultation  Watchman device, atrial fibrillation  HPI: 69 year old female with persistent atrial fibrillation, 82% V pacing with prior intracranial hemorrhage on Coumadin subsequently treated with aspirin Plavix, pacemaker [place] prior syncope/falls here for evaluation for Watchman device.  chads2vasc score is at least 5  She has previously spoken with Dr. Rayann Heman and is interested in long-term stroke prevention with watchman, left atrial appendage closure device.  Overall she feels well with no shortness of breath, chest pain, syncope, palpitations.   PMH:   Past Medical History  Diagnosis Date  . CAD (coronary artery disease) 08/15/13  . Seizure ([place]) 2014    occured in setting of [place]  . Hypertension   . GERD (gastroesophageal reflux disease)   . Permanent atrial fibrillation (HCC)     chads2vasc score is at least 5  . Diabetes mellitus without complication ([place])   . Essential tremor   . Transient ischemic attack   . Subarachnoid hemorrhage ([place])   . Spinal stenosis   . Diverticulosis   . PAD (peripheral artery disease) ([place])   . Pulmonary embolism ([place])   . Percutaneous transluminal coronary angioplasty status   . Long-term (current) use of anticoagulants   . Hyperlipidemia     mixed  . History of long-term use of multiple prescription drugs   . Sick sinus syndrome ([place])   . Pulmonary fibrosis (HCC)     PSH:   Past Surgical History  Procedure Laterality Date  . Cardiac catheterization  10/14    PCI   . Basal cell carcinoma excision    . Cesarean section N/A 1978    x 1  . Hernia repair  06/2010  . Abdominal hysterectomy  2004  . Removal of big toe nail Bilateral   . Carpel tunnel surgery Bilateral 2006/ 2010  . Titanium pins inserted metatarsol bone  02/2012    2 pins placed   . Pacemaker insertion   03/05/15    [place] DR implanted [place]  . Tee without cardioversion N/A 08/16/2015    Procedure: TRANSESOPHAGEAL ECHOCARDIOGRAM (TEE);  Surgeon: Pixie Casino, MD;  Location: [facility] ENDOSCOPY;  Service: Cardiovascular;  Laterality: N/A;   Allergies:  Sulfa antibiotics Prior to Admit Meds:   Prior to Admission medications   Medication Sig Start Date End Date Taking? Authorizing Provider  aspirin 81 MG tablet Take 81 mg by mouth daily.   Yes Historical Provider, MD  clopidogrel (PLAVIX) 75 MG tablet Take 75 mg by mouth daily. 07/25/15  Yes Historical Provider, MD  enalapril (VASOTEC) 10 MG tablet Take 10 mg by mouth daily.   Yes Historical Provider, MD  glipiZIDE (GLUCOTROL XL) 10 MG 24 hr tablet Take 10 mg by mouth daily with breakfast.   Yes Historical Provider, MD  lansoprazole (PREVACID) 15 MG capsule Take 15 mg by mouth daily at 12 noon.   Yes Historical Provider, MD  levETIRAcetam (KEPPRA) 500 MG tablet Take 500 mg by mouth 2 (two) times daily.   Yes Historical Provider, MD  lovastatin (MEVACOR) 40 MG tablet Take 40 mg by mouth at bedtime.   Yes Historical Provider, MD  Multiple Vitamin (MULTIVITAMIN) tablet Take 1 tablet by mouth daily.   Yes Historical Provider, MD  nitroGLYCERIN (NITROSTAT) 0.4 MG SL tablet Place 0.4 mg under the tongue every 5 (five) minutes as needed for chest pain.  Yes Historical Provider, MD  ondansetron (ZOFRAN) 4 MG tablet Take 4 mg by mouth every 8 (eight) hours as needed for nausea or vomiting.   Yes Historical Provider, MD  OVER THE COUNTER MEDICATION Take 1 Dose by mouth daily. OTC Joint Juice "1,500 mg of glucosamine plus chondroitin, Vitamin C and Vitamin D"   Yes Historical Provider, MD   Fam HX:    Family History  Problem Relation Age of Onset  . Stroke Mother   . Epilepsy Mother   . Stroke Father   . Heart attack Father   . Atrial fibrillation Sister   . Atrial fibrillation Brother   . Diabetes Mellitus II Brother      Social HX:    Social History   Social History  . Marital Status: Widowed    Spouse Name: N/A  . Number of Children: N/A  . Years of Education: N/A   Occupational History  . Not on file.   Social History Main Topics  . Smoking status: Former Smoker    Quit date: 07/06/2009  . Smokeless tobacco: Not on file     Comment: didnt smoke much  . Alcohol Use: 0.0 oz/week    0 Standard drinks or equivalent per week     Comment: very infrequently  . Drug Use: No  . Sexual Activity: Not on file   Other Topics Concern  . Not on file   Social History Narrative   Pt recently moved [place] [facility] [place].  She now lived with her daughter.   Here today with her partner.   Previously worked as a Teacher, music.     ROS:  All 11 ROS were addressed and are negative except what is stated in the HPI   Physical Exam: Blood pressure 135/66, pulse 62, temperature 97.9 F (36.6 C), temperature source Oral, resp. rate 18, height 5\' 6"  (1.676 m), weight 210 lb (95.255 kg), SpO2 99 %.   General: Well developed, well nourished, in no acute distress Head: Eyes PERRLA, No xanthomas.   Normal cephalic and atramatic  Lungs:   Clear bilaterally to auscultation and percussion. Normal respiratory effort. No wheezes, no rales. Heart:  regular with occasional ectopy  Pulses are 2+ & equal. No murmur, rubs, gallops.  No carotid bruit. No JVD.  No abdominal bruits. Pacemaker in place Abdomen: Bowel sounds are positive, abdomen soft and non-tender without masses. No hepatosplenomegaly. Overweight Msk:  Back normal. Normal strength and tone for age. Extremities:  No clubbing, cyanosis or edema.  DP +1 Neuro: Alert and oriented X 3, non-focal, MAE x 4 GU: Deferred Rectal: Deferred Psych:  Good affect, responds appropriately      Labs: Pending   ECHO, TEE - Left ventricle: There was mild concentric hypertrophy. Systolic function was normal. The estimated ejection fraction was in  the range of 55% to 60%. Wall motion was normal; there were noregional wall motion abnormalities. - Aortic valve: There was trivial regurgitation. - Mitral valve: Mildly thickened leaflets . There was mild to moderate regurgitation with 2 distinct jets. - Left atrium: The atrium was dilated. No evidence of thrombus inthe atrial cavity or appendage. Cauliflower shape. Greatestmeasurement is 24 mm. - Right ventricle: Pacer wire or catheter noted in right ventricle. - Right atrium: Pacer wire or catheter noted in right atrium. - Atrial septum: No defect or patent foramen ovale was identified. - Tricuspid valve: There was mild regurgitation. - Pulmonic valve: There was trivial regurgitation. - Pericardium, extracardiac: There was  no pericardial effusion.  Impressions:  - Watchman study. No LAA thrombus. Greatest ostial measurement is24 mm. Recommend a 27 mm device.  EKG:  Atrial fibrillation, V paced Personally viewed.   ASSESSMENT/PLAN:    69 year old female with persistent atrial fibrillation with increased risk of stroke CHADSVASc 5 deemed not a long-term anticoagulation candidate secondary to prior intracranial hemorrhage here for evaluation for left atrial appendage occluder device, watchman device.   - We have reviewed the risks and benefits of the procedure including stroke, MI, death, risk of pericardial effusion of approximately 1%. She is willing to proceed. Based upon her elevated risk score and prior intracranial hemorrhage, I do believe that she is a good candidate for watchman device to provide decreased stroke risk over long-term. TEE results reviewed. Understands use of coumadin post procedure short term.    - Pacemaker functioning properly  - Diabetes per primary team  - Lovastatin for hyperlipidemia  - Coronary disease has been stable, no chest pain, no ischemic symptoms  - Hypertension well controlled.    Candee Furbish, MD  08/26/2015  10:37 AM

## 2015-08-26 NOTE — Progress Notes (Signed)
16 fr venous watchman sheath aspirated and removed from right femoral vein. Manual pressure applied for 30 minutes. Groin level 0.  Distal pulse dp palpable. Tegaderm dressing applied.  Left wrist arterial line removed per Dr. Rayann Heman manual pressure applied for 10 minutes. Site level 0. Tegaderm dressing applied. Radial pulse present post removal. Spo2 96%as measured in left middle finger.  Bedrest instructions given  Bedrest begins at 16:20:00

## 2015-08-26 NOTE — Transfer of Care (Signed)
Immediate Anesthesia Transfer of Care Note  Patient: Heather May  Procedure(s) Performed: Procedure(s): LEFT ATRIAL APPENDAGE OCCLUSION (N/A)  Patient Location: Cath Lab  Anesthesia Type:General  Level of Consciousness: awake and oriented  Airway & Oxygen Therapy: Patient Spontanous Breathing  Post-op Assessment: Report given to RN  Post vital signs: Reviewed and stable  Last Vitals:  Filed Vitals:   08/26/15 1512  BP:   Pulse: 121  Temp:   Resp:     Complications: No apparent anesthesia complications

## 2015-08-26 NOTE — Interval H&P Note (Signed)
History and Physical Interval Note:  Pt seen and discussion with Dr Marlou Porch noted.  She is at high risk for long term anticoagulation and also has stroke risks with AF.  I agree with WATCHMAN implant. Risk, benefits, and alternatives were also discussed in detail today. These risks include but are not limited to stroke, bleeding, vascular damage, tamponade, perforation, damage to the esophagus, lungs, and other structures, pacemaker lead dislodgement, worsening renal function, and death. The patient understands these risk and wishes to proceed.       08/26/2015 12:39 PM  Heather May  has presented today for surgery, with the diagnosis of afib  The various methods of treatment have been discussed with the patient and family. After consideration of risks, benefits and other options for treatment, the patient has consented to  Procedure(s): LEFT ATRIAL APPENDAGE OCCLUSION (N/A) as a surgical intervention .  The patient's history has been reviewed, patient examined, no change in status, stable for surgery.  I have reviewed the patient's chart and labs.  Questions were answered to the patient's satisfaction.     Thompson Grayer

## 2015-08-26 NOTE — Discharge Summary (Signed)
ELECTROPHYSIOLOGY PROCEDURE DISCHARGE SUMMARY    Patient ID: Heather May,  MRN: [id number], DOB/AGE: 1946-07-23 69 y.o.  Admit date: 08/26/2015 Discharge date: 08/27/2015  Primary Care Physician: PROVIDER NOT IN SYSTEM Primary Cardiologist: Marlou Porch Electrophysiologist: Thompson Grayer, MD  Primary Discharge Diagnosis:  Permanent atrial fibrillation with prior ICH status post LAA occluder insertion this admission  Secondary Discharge Diagnosis:  1.  CAD 2.  HTN 3.  GERD 4.  Diabetes 5.  Prior TIA 6.  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  Insertion of left atrial appendage occluder (Watchman) on 08/26/15 by Dr Rayann Heman and Dr Burt Knack.  This study demonstrated successful implantation of Watchman LAA occluder with no early apparent complications. TEE at time of procedure demonstrated no leak around device.   Brief HPI: Heather May is a 69 y.o. female with a history of permanent atrial fibrillation.  They are felt to not be a candidate for long term Warfarin due to prior ICH. Risks, benefits, and alternatives to Watchman implant were reviewed with the patient who wished to proceed.  The patient underwent TEE prior to the procedure which demonstrated appendage suitable for attempt at [facility] placement.    Hospital Course:  The patient was admitted and underwent Watchman insertion with details as outlined above.  They were monitored on telemetry overnight which demonstrated rate controlled afib.  Groin was without complication on the day of discharge.  The patient was examined and considered to be stable for discharge.  Bedside echo by Dr Rayann Heman demonstrated stable device position without pericardial effusion.  Wound care and restrictions were reviewed with the patient.  She had moderate groin discomfort but no hematoma or bruit.  This patients CHA2DS2-VASc Score is 6  The patient will be started on Warfarin at discharge with goal INR of 2-3.  Coumadin  clinic follow up will be arranged in 4 days.  The patient will be seen by APP in 1 week for groin check, monitor for s/s of bleeding, CBC.  45 day TEE and office visit with AF clinic will also be scheduled.   Physical Exam: Filed Vitals:   08/27/15 0200 08/27/15 0300 08/27/15 0400 08/27/15 0730  BP: 107/71 107/74 121/72 123/85  Pulse: 71 72 68   Temp:   98.7 F (37.1 C) 98 F (36.7 C)  TempSrc:   Oral Oral  Resp: 16 11 14 14   Height:      Weight:      SpO2: 95% 98% 98% 97%    GEN- The patient is well appearing, alert and oriented x 3 today.   HEENT: normocephalic, atraumatic; sclera clear, conjunctiva pink; hearing intact; oropharynx clear; neck supple Lungs- Clear to ausculation bilaterally, normal work of breathing.  No wheezes, rales, rhonchi Heart- irregular rate and rhythm, no murmurs, rubs or gallops GI- soft, non-tender, non-distended, bowel sounds present Extremities- no clubbing, cyanosis, or edema; DP/PT/radial pulses 2+ bilaterally, groin without hematoma/bruit MS- no significant deformity or atrophy Skin- warm and dry, no rash or lesion Psych- euthymic mood, full affect Neuro- strength and sensation are intact   Labs:   Lab Results  Component Value Date   WBC 5.2 08/26/2015   HGB 12.8 08/26/2015   HCT 38.0 08/26/2015   MCV 94.3 08/26/2015   PLT 159 08/26/2015     Recent Labs Lab 08/27/15 0347  NA 137  K 4.2  CL 101  CO2 25  BUN 12  CREATININE 0.83  CALCIUM 9.0  GLUCOSE 255*  Discharge Medications:    Medication List    STOP taking these medications        clopidogrel 75 MG tablet  Commonly known as:  PLAVIX      TAKE these medications        aspirin 81 MG tablet  Take 81 mg by mouth daily.     benazepril 10 MG tablet  Commonly known as:  LOTENSIN  Take 10 mg by mouth daily.     enalapril 10 MG tablet  Commonly known as:  VASOTEC  Take 10 mg by mouth daily.     glipiZIDE 10 MG 24 hr tablet  Commonly known as:  GLUCOTROL XL    Take 10 mg by mouth 2 (two) times daily.     lansoprazole 15 MG capsule  Commonly known as:  PREVACID  Take 15 mg by mouth daily at 12 noon.     levETIRAcetam 500 MG tablet  Commonly known as:  KEPPRA  Take 500 mg by mouth 2 (two) times daily.     lovastatin 40 MG tablet  Commonly known as:  MEVACOR  Take 40 mg by mouth at bedtime.     multivitamin tablet  Take 1 tablet by mouth daily.     nitroGLYCERIN 0.4 MG SL tablet  Commonly known as:  NITROSTAT  Place 0.4 mg under the tongue every 5 (five) minutes as needed for chest pain.     ondansetron 4 MG tablet  Commonly known as:  ZOFRAN  Take 4 mg by mouth every 8 (eight) hours as needed for nausea or vomiting.     OVER THE COUNTER MEDICATION  Take 1 Dose by mouth daily. OTC Joint Juice "1,500 mg of glucosamine plus chondroitin, Vitamin C and Vitamin D"     warfarin 5 MG tablet  Commonly known as:  COUMADIN  Take 1 tablet (5 mg total) by mouth daily.        Disposition:   Follow-up Information    Follow up with [facility] On 08/30/2015.   Specialty:  Cardiology   Why:  at 10:30 for coumadin check   Contact information:   [address] [place] [phone]      Follow up with Thompson Grayer, MD On 09/06/2015.   Specialty:  Cardiology   Why:  at 8:30AM (appt with Chanetta Marshall, NP)   Contact information:   [place] [address] [phone]       Duration of Discharge Encounter: Greater than 30 minutes including physician time.  Signed, Thompson Grayer, MD 08/27/2015 8:21 AM

## 2015-08-26 NOTE — Discharge Instructions (Signed)
No driving for 4 days. No lifting over 5 lbs for 1 week. No sexual activity for 1 week. You may return to work in 1 week. Keep procedure site clean & dry. If you notice increased pain, swelling, bleeding or pus, call/return!  You may shower, but no soaking baths/hot tubs/pools for 1 week.  ° ° °

## 2015-08-26 NOTE — Progress Notes (Signed)
Echocardiogram Echocardiogram Transesophageal(Watchman Placement) has been performed.  Heather May 08/26/2015, 3:54 PM

## 2015-08-27 ENCOUNTER — Other Ambulatory Visit: Payer: Self-pay

## 2015-08-27 ENCOUNTER — Encounter (HOSPITAL_COMMUNITY): Payer: Self-pay | Admitting: Internal Medicine

## 2015-08-27 ENCOUNTER — Inpatient Hospital Stay (HOSPITAL_COMMUNITY): Payer: Medicare Other

## 2015-08-27 DIAGNOSIS — I481 Persistent atrial fibrillation: Principal | ICD-10-CM

## 2015-08-27 LAB — BASIC METABOLIC PANEL
Anion gap: 11 (ref 5–15)
BUN: 12 mg/dL (ref 6–20)
CO2: 25 mmol/L (ref 22–32)
Calcium: 9 mg/dL (ref 8.9–10.3)
Chloride: 101 mmol/L (ref 101–111)
Creatinine, Ser: 0.83 mg/dL (ref 0.44–1.00)
GFR calc Af Amer: >60 mL/min (ref >60)
GFR calc non Af Amer: >60 mL/min (ref >60)
Glucose, Bld: 255 mg/dL — ABNORMAL HIGH (ref 65–99)
Potassium: 4.2 mmol/L (ref 3.5–5.1)
Sodium: 137 mmol/L (ref 135–145)

## 2015-08-27 LAB — PROTIME-INR
INR: 1.15 (ref 0.00–1.49)
Prothrombin Time: 14.9 seconds (ref 11.6–15.2)

## 2015-08-27 LAB — GLUCOSE, CAPILLARY
Glucose-Capillary: 293 mg/dL — ABNORMAL HIGH (ref 65–99)
Glucose-Capillary: 241 mg/dL — ABNORMAL HIGH (ref 65–99)
Glucose-Capillary: 230 mg/dL — ABNORMAL HIGH (ref 65–99)

## 2015-08-27 MED ORDER — WARFARIN SODIUM 5 MG PO TABS: 5.0000 mg | ORAL_TABLET | Freq: Every day | ORAL | Status: DC

## 2015-08-27 NOTE — Anesthesia Postprocedure Evaluation (Signed)
  Anesthesia Post-op Note  Patient: Heather May  Procedure(s) Performed: Procedure(s): LEFT ATRIAL APPENDAGE OCCLUSION (N/A)  Patient Location: PACU  Anesthesia Type:General  Level of Consciousness: awake  Airway and Oxygen Therapy: Patient Spontanous Breathing  Post-op Pain: none  Post-op Assessment: Post-op Vital signs reviewed, Patient's Cardiovascular Status Stable, Respiratory Function Stable, Patent Airway, No signs of Nausea or vomiting and Pain level controlled              Post-op Vital Signs: Reviewed and stable  Last Vitals:  Filed Vitals:   08/27/15 1136  BP:   Pulse:   Temp: 36.3 C  Resp:     Complications: No apparent anesthesia complications

## 2015-08-27 NOTE — Progress Notes (Signed)
Discussed discharge instructions gone over with pt and significant other. Instructions included diet, activity restrictions, and medication regimen. No questions at this time. Will discharge home with significant other shortly.

## 2015-08-27 NOTE — Care Management Note (Addendum)
Case Management Note  Patient Details  Name: Heather May MRN: [id number] Date of Birth: 09-01-1946  Subjective/Objective:  Insertion of left atrial appendage occluder (Watchman) on 08/26/15  PCP-Dr Patty Sermons PA [place], [place], [place] [address] Phone [phone], fax [phone]                  Action/Plan: NCM spoke to pt and gave permission to speak to Karoline Caldwell # [phone]. Pt states she recently moved here 3 months ago from [place] in with her daughter temp until they were able to move into townhouse. States they are ready to move into their home. Requesting hospital bed for home. Contacted attending, Dr. Rayann Heman and pt referred to her PCP office. Spoke to partner and states pt had last visit with PA, Judy Na. Contacted office and spoke to receptions. PA in with pt at time of call, will leave message with PA, and Dr. Tamala Julian was out of office. Pt gave permission to fax dc summary to her PCP's office for follow up with Bethena Roys Na PA to order hospital bed. Faxed dc summary and H&P and request for hospital bed. Provided [facility] info and fax number.   Has RW (rolling walker with seat) and bedside commode at home.  Expected Discharge Date:  08/27/2015              Expected Discharge Plan:  Home/Self Care  In-House Referral:     Discharge planning Services  CM Consult   Status of Service:  Completed, signed off  Medicare Important Message Given:    Date Medicare IM Given:    Medicare IM give by:    Date Additional Medicare IM Given:    Additional Medicare Important Message give by:     If discussed at [place] of Stay Meetings, dates discussed:    Additional Comments:  Erenest Rasher, RN 08/27/2015, 11:10 AM

## 2015-08-31 ENCOUNTER — Ambulatory Visit (INDEPENDENT_AMBULATORY_CARE_PROVIDER_SITE_OTHER): Payer: Medicare Other | Admitting: Pharmacist

## 2015-08-31 DIAGNOSIS — Z5181 Encounter for therapeutic drug level monitoring: Secondary | ICD-10-CM | POA: Insufficient documentation

## 2015-08-31 DIAGNOSIS — I480 Paroxysmal atrial fibrillation: Secondary | ICD-10-CM | POA: Diagnosis not present

## 2015-08-31 LAB — POCT INR: INR: 1.2

## 2015-08-31 NOTE — Progress Notes (Signed)

## 2015-09-06 ENCOUNTER — Ambulatory Visit (INDEPENDENT_AMBULATORY_CARE_PROVIDER_SITE_OTHER): Payer: Medicare Other | Admitting: Nurse Practitioner

## 2015-09-06 ENCOUNTER — Ambulatory Visit (INDEPENDENT_AMBULATORY_CARE_PROVIDER_SITE_OTHER): Payer: Medicare Other | Admitting: Pharmacist

## 2015-09-06 ENCOUNTER — Encounter: Payer: Self-pay | Admitting: Nurse Practitioner

## 2015-09-06 DIAGNOSIS — I481 Persistent atrial fibrillation: Secondary | ICD-10-CM

## 2015-09-06 DIAGNOSIS — I480 Paroxysmal atrial fibrillation: Secondary | ICD-10-CM

## 2015-09-06 DIAGNOSIS — Z5181 Encounter for therapeutic drug level monitoring: Secondary | ICD-10-CM | POA: Diagnosis not present

## 2015-09-06 DIAGNOSIS — R001 Bradycardia, unspecified: Secondary | ICD-10-CM | POA: Diagnosis not present

## 2015-09-06 DIAGNOSIS — I4819 Other persistent atrial fibrillation: Secondary | ICD-10-CM

## 2015-09-06 LAB — POCT INR: INR: 2.5

## 2015-09-06 MED ORDER — WARFARIN SODIUM 5 MG PO TABS: ORAL_TABLET | ORAL | Status: DC

## 2015-09-06 NOTE — Patient Instructions (Addendum)
Medication Instructions:  Your physician recommends that you continue on your current medications as directed. Please refer to the Current Medication list given to you today.   Labwork: None ordered   Testing/Procedures: Your physician has requested that you have a TEE. During a TEE, sound waves are used to create images of your heart. It provides your doctor with information about the size and shape of your heart and how well your heart's chambers and valves are working. In this test, a transducer is attached to the end of a flexible tube that's guided down your throat and into your esophagus (the tube leading from you mouth to your stomach) to get a more detailed image of your heart. You are not awake for the procedure. Please see the instruction sheet given to you today. For further information please visit [email or web address].  Please check in at the [facility] on 10/11/16 at 1:30pm for a 3pm TEE. Case number [id number]  DO NOT EAT OR DRINK AFTER MIDNIGHT THE NIGHT BEFORE procedure     Follow-Up:  Your physician recommends that you schedule a follow-up appointment on 10/13/15 at 3:45pm with Chanetta Marshall, NP   Any Other Special Instructions Will Be Listed Below (If Applicable).     If you need a refill on your cardiac medications before your next appointment, please call your pharmacy.  Your physician recommends that you continue on your current medications as directed. Please refer to the Current Medication list given to you today.c

## 2015-09-06 NOTE — Progress Notes (Signed)
Electrophysiology Office Note Date: 09/06/2015  ID:  Heather May, DOB 10-14-1946, MRN [id number]  PCP: Iran Planas, MD Primary Cardiologist: Marlou Porch Electrophysiologist: Rayann Heman  CC: Watchman follow up  Heather May is a 69 y.o. female seen today for Dr Rayann Heman.  She presents today for post watchman implant followup.  Since discharge, the patient reports doing relatively well.  She had a groin hematoma which is resolving.  She is increasing her activity slowly over time. She has not had any bleeding complications and is tolerating Warfarin/ASA.  She denies chest pain, palpitations, dyspnea, PND, orthopnea, nausea, vomiting, dizziness, syncope, edema, weight gain, or early satiety.  Past Medical History  Diagnosis Date  . CAD (coronary artery disease) 08/15/13  . Seizure ([place]) 2014    occured in setting of [place]  . Hypertension   . GERD (gastroesophageal reflux disease)   . Permanent atrial fibrillation (HCC)     chads2vasc score is at least 5  . Diabetes mellitus without complication ([place])   . Essential tremor   . Transient ischemic attack   . Subarachnoid hemorrhage ([place])   . Spinal stenosis   . Diverticulosis   . PAD (peripheral artery disease) ([place])   . Pulmonary embolism ([place])   . Percutaneous transluminal coronary angioplasty status   . Long-term (current) use of anticoagulants   . Hyperlipidemia     mixed  . History of long-term use of multiple prescription drugs   . Sick sinus syndrome ([place])   . Pulmonary fibrosis [facility])    Past Surgical History  Procedure Laterality Date  . Cardiac catheterization  10/14    PCI   . Basal cell carcinoma excision    . Cesarean section N/A 1978    x 1  . Hernia repair  06/2010  . Abdominal hysterectomy  2004  . Removal of big toe nail Bilateral   . Carpel tunnel surgery Bilateral 2006/ 2010  . Titanium pins inserted metatarsol bone  02/2012    2 pins placed   . Pacemaker insertion  03/05/15    [place] DR  implant[place]  . Tee without cardioversion N/A 08/16/2015    Procedure: TRANSESOPHAGEAL ECHOCARDIOGRAM (TEE);  Surgeon: Pixie Casino, MD;  Location: [facility] ENDOSCOPY;  Service: Cardiovascular;  Laterality: N/A;  . Electrophysiologic study N/A 08/26/2015    LEFT ATRIAL APPENDAGE OCCLUSION;  WATCHMAN device implanted by Dr Rayann Heman    Current Outpatient Prescriptions  Medication Sig Dispense Refill  . aspirin 81 MG tablet Take 81 mg by mouth daily.    . enalapril (VASOTEC) 10 MG tablet Take 10 mg by mouth daily.    [name] glipiZIDE (GLUCOTROL XL) 10 MG 24 hr tablet Take 10 mg by mouth 2 (two) times daily.     . lansoprazole (PREVACID) 15 MG capsule Take 15 mg by mouth daily at 12 noon.    . levETIRAcetam (KEPPRA) 500 MG tablet Take 500 mg by mouth 2 (two) times daily.    [name] lovastatin (MEVACOR) 40 MG tablet Take 40 mg by mouth at bedtime.    . Multiple Vitamin (MULTIVITAMIN) tablet Take 1 tablet by mouth daily.    . nitroGLYCERIN (NITROSTAT) 0.4 MG SL tablet Place 0.4 mg under the tongue every 5 (five) minutes as needed for chest pain.    [name] ondansetron (ZOFRAN) 4 MG tablet Take 4 mg by mouth every 8 (eight) hours as needed for nausea or vomiting.    [name] OVER THE COUNTER MEDICATION Take 1 Dose  by mouth daily. OTC Joint Juice "1,500 mg of glucosamine plus chondroitin, Vitamin C and Vitamin D"    . warfarin (COUMADIN) 5 MG tablet Take as directed by [facility]madin Clinic 45 tablet 0   No current facility-administered medications for this visit.    Allergies:   Sulfa antibiotics   Social History: Social History   Social History  . Marital Status: Widowed    Spouse Name: N/A  . Number of Children: N/A  . Years of Education: N/A   Occupational History  . Not on file.   Social History Main Topics  . Smoking status: Former Smoker    Quit date: 07/06/2009  . Smokeless tobacco: Not on file     Comment: didnt smoke much  . Alcohol Use: 0.0 oz/week    0 Standard drinks or equivalent per  week     Comment: very infrequently  . Drug Use: No  . Sexual Activity: Not on file   Other Topics Concern  . Not on file   Social History Narrative   Pt recently moved to [facility] [place].  She now lived with her daughter.   Here today with her partner.   Previously worked as a Teacher, music.    Family History: Family History  Problem Relation Age of Onset  . Stroke Mother   . Epilepsy Mother   . Stroke Father   . Heart attack Father   . Atrial fibrillation Sister   . Atrial fibrillation Brother   . Diabetes Mellitus II Brother     Review of Systems: All other systems reviewed and are otherwise negative except as noted above.   Physical Exam: VS:  There were no vitals taken for this visit. , BMI There is no weight on file to calculate BMI. Wt Readings from Last 3 Encounters:  08/26/15 210 lb (95.255 kg)  08/16/15 208 lb (94.348 kg)  07/07/15 208 lb (94.348 kg)    GEN- The patient is elderly and obese appearing, alert and oriented x 3 today.   HEENT: normocephalic, atraumatic; sclera clear, conjunctiva pink; hearing intact; oropharynx clear; neck supple  Lungs- Clear to ausculation bilaterally, normal work of breathing.  No wheezes, rales, rhonchi Heart- Irregular rate and rhythm  GI- soft, non-tender, non-distended, bowel sounds present  Extremities- no clubbing, cyanosis, bilateral trace edema; DP/PT/radial pulses 1+ bilaterally, resolving ecchymosis RLE MS- no significant deformity or atrophy Skin- warm and dry, no rash or lesion  Psych- euthymic mood, full affect Neuro- strength and sensation are intact   EKG:  EKG is not ordered today.   Recent Labs: 08/26/2015: Hemoglobin 12.8; Platelets 159 08/27/2015: BUN 12; Creatinine, Ser 0.83; Potassium 4.2; Sodium 137    Other studies Reviewed: Additional studies/ records that were reviewed today include: hospital records  Assessment and Plan: 1.  Permanent atrial fibrillation Doing well s/p  Watchman implant Continue Warfarin for now with CHADS2VASC of at least 5 Will schedule TEE 10/12/15 with Dr Debara Pickett to evaluate appendage   2.  Symptomatic bradycardia S/p PPM implant  Normal pacemaker function at interrogation 06/2015 Continue remote follow up  3.  CAD No recent ischemic symptoms Continue current plan     Current medicines are reviewed at length with the patient today.   The patient does not have concerns regarding her medicines.  The following changes were made today:  none  Labs/ tests ordered today include: TEE    Disposition:   Follow up with me in 6 weeks following TEE  Signed, Chanetta Marshall, NP 09/06/2015 5:56 PM   [place] [address] [phone] (office) [phone] (fax)

## 2015-09-07 ENCOUNTER — Telehealth: Payer: Self-pay | Admitting: Internal Medicine

## 2015-09-07 NOTE — Telephone Encounter (Signed)
New problem   Pt stated she was told by you to call you back today. She no longer is taking Enalapril 10mg .

## 2015-09-13 ENCOUNTER — Ambulatory Visit (INDEPENDENT_AMBULATORY_CARE_PROVIDER_SITE_OTHER): Payer: Medicare Other

## 2015-09-13 DIAGNOSIS — I480 Paroxysmal atrial fibrillation: Secondary | ICD-10-CM | POA: Diagnosis not present

## 2015-09-13 DIAGNOSIS — Z5181 Encounter for therapeutic drug level monitoring: Secondary | ICD-10-CM

## 2015-09-13 LAB — POCT INR: INR: 1.4

## 2015-09-20 ENCOUNTER — Ambulatory Visit (INDEPENDENT_AMBULATORY_CARE_PROVIDER_SITE_OTHER): Payer: Medicare Other | Admitting: *Deleted

## 2015-09-20 DIAGNOSIS — Z5181 Encounter for therapeutic drug level monitoring: Secondary | ICD-10-CM

## 2015-09-20 DIAGNOSIS — I480 Paroxysmal atrial fibrillation: Secondary | ICD-10-CM

## 2015-09-20 LAB — POCT INR: INR: 2.2

## 2015-09-27 ENCOUNTER — Ambulatory Visit (INDEPENDENT_AMBULATORY_CARE_PROVIDER_SITE_OTHER): Payer: Medicare Other | Admitting: *Deleted

## 2015-09-27 DIAGNOSIS — I480 Paroxysmal atrial fibrillation: Secondary | ICD-10-CM | POA: Diagnosis not present

## 2015-09-27 DIAGNOSIS — Z5181 Encounter for therapeutic drug level monitoring: Secondary | ICD-10-CM | POA: Diagnosis not present

## 2015-09-27 LAB — POCT INR: INR: 2.3

## 2015-10-04 ENCOUNTER — Ambulatory Visit (INDEPENDENT_AMBULATORY_CARE_PROVIDER_SITE_OTHER): Payer: Medicare Other | Admitting: *Deleted

## 2015-10-04 ENCOUNTER — Telehealth: Payer: Self-pay | Admitting: Internal Medicine

## 2015-10-04 DIAGNOSIS — I480 Paroxysmal atrial fibrillation: Secondary | ICD-10-CM | POA: Diagnosis not present

## 2015-10-04 DIAGNOSIS — Z5181 Encounter for therapeutic drug level monitoring: Secondary | ICD-10-CM | POA: Diagnosis not present

## 2015-10-04 LAB — POCT INR: INR: 1.9

## 2015-10-04 NOTE — Telephone Encounter (Signed)
SPOKE to [place] at [employer]. cliinc She had a question concerning INR today 1.9  patient is schedule for TEE.  RN infomed her to speak with Dr Rayann Heman concerning - since he was familiar with patient since Dr Debara Pickett was out of the office. She verbalized that she would do that.

## 2015-10-06 ENCOUNTER — Encounter: Payer: Medicare Other | Admitting: Nurse Practitioner

## 2015-10-12 ENCOUNTER — Ambulatory Visit (HOSPITAL_COMMUNITY)
Admission: RE | Admit: 2015-10-12 | Discharge: 2015-10-12 | Disposition: A | Discharge status: Home / Self Care | Payer: Medicare Other | Source: Ambulatory Visit | Attending: Internal Medicine | Admitting: Internal Medicine

## 2015-10-12 ENCOUNTER — Encounter (HOSPITAL_COMMUNITY): Payer: Self-pay | Admitting: *Deleted

## 2015-10-12 ENCOUNTER — Ambulatory Visit (HOSPITAL_BASED_OUTPATIENT_CLINIC_OR_DEPARTMENT_OTHER)
Admission: RE | Admit: 2015-10-12 | Discharge: 2015-10-12 | Disposition: A | Discharge status: Home / Self Care | Payer: Medicare Other | Source: Ambulatory Visit | Admitting: Internal Medicine

## 2015-10-12 ENCOUNTER — Encounter (HOSPITAL_COMMUNITY)
Admission: RE | Disposition: A | Discharge status: Home / Self Care | Payer: Self-pay | Source: Ambulatory Visit | Attending: Internal Medicine

## 2015-10-12 DIAGNOSIS — E119 Type 2 diabetes mellitus without complications: Secondary | ICD-10-CM | POA: Diagnosis not present

## 2015-10-12 DIAGNOSIS — K219 Gastro-esophageal reflux disease without esophagitis: Secondary | ICD-10-CM | POA: Insufficient documentation

## 2015-10-12 DIAGNOSIS — I340 Nonrheumatic mitral (valve) insufficiency: Secondary | ICD-10-CM | POA: Diagnosis not present

## 2015-10-12 DIAGNOSIS — I739 Peripheral vascular disease, unspecified: Secondary | ICD-10-CM | POA: Diagnosis not present

## 2015-10-12 DIAGNOSIS — Z7982 Long term (current) use of aspirin: Secondary | ICD-10-CM | POA: Insufficient documentation

## 2015-10-12 DIAGNOSIS — I4891 Unspecified atrial fibrillation: Secondary | ICD-10-CM | POA: Diagnosis not present

## 2015-10-12 DIAGNOSIS — E785 Hyperlipidemia, unspecified: Secondary | ICD-10-CM | POA: Diagnosis not present

## 2015-10-12 DIAGNOSIS — Z8673 Personal history of transient ischemic attack (TIA), and cerebral infarction without residual deficits: Secondary | ICD-10-CM | POA: Insufficient documentation

## 2015-10-12 DIAGNOSIS — Z79899 Other long term (current) drug therapy: Secondary | ICD-10-CM | POA: Insufficient documentation

## 2015-10-12 DIAGNOSIS — I2510 Atherosclerotic heart disease of native coronary artery without angina pectoris: Secondary | ICD-10-CM | POA: Insufficient documentation

## 2015-10-12 DIAGNOSIS — Z86711 Personal history of pulmonary embolism: Secondary | ICD-10-CM | POA: Diagnosis not present

## 2015-10-12 DIAGNOSIS — I10 Essential (primary) hypertension: Secondary | ICD-10-CM | POA: Diagnosis not present

## 2015-10-12 DIAGNOSIS — Z955 Presence of coronary angioplasty implant and graft: Secondary | ICD-10-CM | POA: Insufficient documentation

## 2015-10-12 DIAGNOSIS — I4819 Other persistent atrial fibrillation: Secondary | ICD-10-CM | POA: Insufficient documentation

## 2015-10-12 HISTORY — PX: TEE WITHOUT CARDIOVERSION: SHX5443

## 2015-10-12 SURGERY — ECHOCARDIOGRAM, TRANSESOPHAGEAL
Anesthesia: Moderate Sedation

## 2015-10-12 MED ORDER — BUTAMBEN-TETRACAINE-BENZOCAINE 2-2-14 % EX AERO
INHALATION_SPRAY | CUTANEOUS | Status: DC | PRN
  Administered 2015-10-12: 2 via TOPICAL

## 2015-10-12 MED ORDER — LIDOCAINE VISCOUS 2 % MT SOLN
OROMUCOSAL | Status: AC
  Filled 2015-10-12: qty 15

## 2015-10-12 MED ORDER — LIDOCAINE VISCOUS 2 % MT SOLN
OROMUCOSAL | Status: DC | PRN
  Administered 2015-10-12: 10 mL via OROMUCOSAL

## 2015-10-12 MED ORDER — SODIUM CHLORIDE 0.9 % IV SOLN: INTRAVENOUS | Status: DC

## 2015-10-12 MED ORDER — FENTANYL CITRATE (PF) 100 MCG/2ML IJ SOLN
INTRAMUSCULAR | Status: AC
  Filled 2015-10-12: qty 2

## 2015-10-12 MED ORDER — MIDAZOLAM HCL 5 MG/ML IJ SOLN
INTRAMUSCULAR | Status: AC
  Filled 2015-10-12: qty 2

## 2015-10-12 MED ORDER — FENTANYL CITRATE (PF) 100 MCG/2ML IJ SOLN
INTRAMUSCULAR | Status: DC | PRN
  Administered 2015-10-12 (×2): 25 ug via INTRAVENOUS

## 2015-10-12 MED ORDER — MIDAZOLAM HCL 10 MG/2ML IJ SOLN
INTRAMUSCULAR | Status: DC | PRN
  Administered 2015-10-12: 2 mg via INTRAVENOUS
  Administered 2015-10-12: 1 mg via INTRAVENOUS

## 2015-10-12 MED ORDER — BENAZEPRIL HCL 10 MG PO TABS
10.0000 mg | ORAL_TABLET | Freq: Every day | ORAL | Status: DC
  Administered 2015-10-12: 10 mg via ORAL
  Filled 2015-10-12: qty 1

## 2015-10-12 MED ORDER — DIPHENHYDRAMINE HCL 50 MG/ML IJ SOLN
INTRAMUSCULAR | Status: AC
  Filled 2015-10-12: qty 1

## 2015-10-12 NOTE — Progress Notes (Signed)
Echocardiogram Echocardiogram Transesophageal has been performed.  Heather May 10/12/2015, 3:20 PM

## 2015-10-12 NOTE — CV Procedure (Addendum)
.     TRANSESOPHAGEAL ECHOCARDIOGRAM (TEE) NOTE  - WATCHMAN ATRIAL APPENDAGE OCCLUDER DEVICE EVALUATION    INDICATIONS: atrial fibrillation  PROCEDURE:   Informed consent was obtained prior to the procedure. The risks, benefits and alternatives for the procedure were discussed and the patient comprehended these risks.  Risks include, but are not limited to, cough, sore throat, vomiting, nausea, somnolence, esophageal and stomach trauma or perforation, bleeding, low blood pressure, aspiration, pneumonia, infection, trauma to the teeth and death.    After a procedural time-out, the patient was given 3 mg versed and 50 mcg fentanyl for moderate sedation.  The oropharynx was anesthetized 10 cc of topical 1% viscous lidocaine and 2 cetacaine sprays.  The transesophageal probe was inserted in the esophagus and stomach without difficulty and multiple views were obtained.  The patient was kept under observation until the patient left the procedure room.  The patient left the procedure room in stable condition.   Agitated microbubble saline contrast was not administered.  COMPLICATIONS:    There were no immediate complications.  Findings:  1. LEFT VENTRICLE: The left ventricular wall thickness is mildly increased.  The left ventricular cavity is normal in size. Wall motion is normal.  LVEF is 55-60%.  2. RIGHT VENTRICLE:  The right ventricle is normal in structure and function without any thrombus or masses.    3. LEFT ATRIUM:  The left atrium is dilated in size without any thrombus or masses.  There is not spontaneous echo contrast ("smoke") in the left atrium consistent with a low flow state.  4. LEFT ATRIAL APPENDAGE:  The left atrial appendage is occluded with a 33 mm Watchman device. The appendage has multiple lobes as previously described. Pulse doppler indicates no flow in the appendage. A previously visualized jet adjacent to the device is no longer noted.  5. ATRIAL SEPTUM:  The atrial  septum appears intact and is free of thrombus and/or masses.  There is no evidence for interatrial shunting by color doppler and saline microbubble.  6. RIGHT ATRIUM:  The right atrium is normal in size and function without any thrombus or masses.  7. MITRAL VALVE:  The mitral valve is normal in structure and function with mild to moderate regurgitation.  There were no vegetations or stenosis.  8. AORTIC VALVE:  The aortic valve is trileaflet, normal in structure and function with Mild central regurgitation.  There were no vegetations or stenosis  9. TRICUSPID VALVE:  The tricuspid valve is normal in structure and function with Mild regurgitation.  There were no vegetations or stenosis  10.  PULMONIC VALVE:  The pulmonic valve is normal in structure and function with trivial regurgitation.  There were no vegetations or stenosis.   11. AORTIC ARCH, ASCENDING AND DESCENDING AORTA:  There was grade 1 Ron Parker et. Al, 1992) atherosclerosis of the ascending aorta, aortic arch, or proximal descending aorta.  12. PULMONARY VEINS: Anomalous pulmonary venous return was not noted.  13. PERICARDIUM: The pericardium appeared normal and non-thickened.  There is no pericardial effusion.  IMPRESSION:   1.  Successful placement of a 33 mm Watchman device. There is no evidence for color jets around the device suggesting a successful closure.  RECOMMENDATIONS:    1. Follow-up with Dr. Rayann Heman tomorrow. Can likely be taken off warfarin per protocol.  Time Spent Directly with the Patient:  45 minutes   Pixie Casino, MD, [facility] Attending Cardiologist [facility]  10/12/2015, 2:21 PM

## 2015-10-12 NOTE — H&P (Addendum)
Electrophysiology Office Note Date: 10/13/2015  ID: Heather May, DOB May 06, 1946, MRN [id number]  PCP: Iran Planas, MD Primary Cardiologist: Marlou Porch Electrophysiologist: Rayann Heman  CC: Watchman follow up  Heather May is a 69 y.o. female seen today for Dr Rayann Heman. She presents today for 6 week watchman followup. Since last being seen in clinic, the patient reports doing relatively well. She has not had any bleeding complications and is tolerating Warfarin/ASA. She denies chest pain, palpitations, dyspnea, PND, orthopnea, nausea, vomiting, dizziness, syncope, edema, weight gain, or early satiety.  Past Medical History  Diagnosis Date  . CAD (coronary artery disease) 08/15/13  . Seizure ([place]) 2014    occured in setting of [place]  . Hypertension   . GERD (gastroesophageal reflux disease)   . Permanent atrial fibrillation (HCC)     chads2vasc score is at least 5  . Diabetes mellitus without complication ([place])   . Essential tremor   . Transient ischemic attack   . Subarachnoid hemorrhage ([place])   . Spinal stenosis   . Diverticulosis   . PAD (peripheral artery disease) ([place])   . Pulmonary embolism ([place])   . Percutaneous transluminal coronary angioplasty status   . Long-term (current) use of anticoagulants   . Hyperlipidemia     mixed  . History of long-term use of multiple prescription drugs   . Sick sinus syndrome ([place])   . Pulmonary fibrosis [facility])    Past Surgical History  Procedure Laterality Date  . Cardiac catheterization  10/14    PCI   . Basal cell carcinoma excision    . Cesarean section N/A 1978    x 1  . Hernia repair  06/2010  . Abdominal hysterectomy  2004  . Removal of big toe nail Bilateral   . Carpel tunnel surgery Bilateral 2006/ 2010  . Titanium pins inserted metatarsol bone  02/2012    2 pins placed   . Pacemaker insertion  03/05/15     [place]  . Tee without cardioversion N/A 08/16/2015    Procedure: TRANSESOPHAGEAL ECHOCARDIOGRAM (TEE); Surgeon: Pixie Casino, MD; Location: [facility] ENDOSCOPY; Service: Cardiovascular; Laterality: N/A;  . Electrophysiologic study N/A 08/26/2015    LEFT ATRIAL APPENDAGE OCCLUSION; WATCHMAN device implanted by Dr Rayann Heman  . Tee without cardioversion N/A 10/12/2015    Procedure: TRANSESOPHAGEAL ECHOCARDIOGRAM (TEE); Surgeon: Pixie Casino, MD; Location: [facility] ENDOSCOPY; Service: Cardiovascular; Laterality: N/A;    Current Outpatient Prescriptions  Medication Sig Dispense Refill  . aspirin 325 MG tablet Take 325 mg by mouth daily.    . benazepril (LOTENSIN) 10 MG tablet Take 10 mg by mouth daily.    [name] glipiZIDE (GLUCOTROL XL) 10 MG 24 hr tablet Take 10 mg by mouth 2 (two) times daily.     . lansoprazole (PREVACID) 15 MG capsule Take 15 mg by mouth daily.     [name] levETIRAcetam (KEPPRA) 500 MG tablet Take 500 mg by mouth 2 (two) times daily.    [name] lovastatin (MEVACOR) 40 MG tablet Take 40 mg by mouth at bedtime.    . Multiple Vitamin (MULTIVITAMIN) tablet Take 1 tablet by mouth daily.    . nitroGLYCERIN (NITROSTAT) 0.4 MG SL tablet Place 0.4 mg under the tongue every 5 (five) minutes as needed for chest pain.    [name] ondansetron (ZOFRAN) 4 MG tablet Take 4 mg by mouth every 8 (eight) hours as needed for nausea or vomiting.    [name] OVER THE COUNTER MEDICATION Take 1 Dose  by mouth daily. OTC Joint Juice "1,500 mg of glucosamine plus chondroitin, Vitamin C and Vitamin D"     No current facility-administered medications for this visit.    Allergies: Sulfa antibiotics   Social History: Social History   Social History  . Marital Status: Widowed    Spouse Name: N/A  . Number of Children: N/A  . Years of Education: N/A   Occupational History    . Not on file.   Social History Main Topics  . Smoking status: Former Smoker    Quit date: 07/06/2009  . Smokeless tobacco: Not on file     Comment: didnt smoke much  . Alcohol Use: No     Comment: very infrequently  . Drug Use: No  . Sexual Activity: Not on file   Other Topics Concern  . Not on file   Social History Narrative   Pt recently moved to [facility] [place]. She now lived with her daughter.   Here today with her partner.   Previously worked as a Teacher, music.    Family History: Family History  Problem Relation Age of Onset  . Stroke Mother   . Epilepsy Mother   . Stroke Father   . Heart attack Father   . Atrial fibrillation Sister   . Atrial fibrillation Brother   . Diabetes Mellitus II Brother     Review of Systems: All other systems reviewed and are otherwise negative except as noted above.   Physical Exam: VS: BP 128/80 mmHg  Pulse 62  Ht 5\' 6"  (1.676 m)  Wt 214 lb (97.07 kg)  BMI 34.56 kg/m2 , BMI Body mass index is 34.56 kg/(m^2). Wt Readings from Last 3 Encounters:  10/13/15 214 lb (97.07 kg)  08/26/15 210 lb (95.255 kg)  08/16/15 208 lb (94.348 kg)    GEN- The patient is elderly and obese appearing, alert and oriented x 3 today.  HEENT: normocephalic, atraumatic; sclera clear, conjunctiva pink; hearing intact; oropharynx clear; neck supple  Lungs- Clear to ausculation bilaterally, normal work of breathing. No wheezes, rales, rhonchi Heart- Irregular rate and rhythm  GI- soft, non-tender, non-distended, bowel sounds present  Extremities- no clubbing, cyanosis, bilateral trace edema; DP/PT/radial pulses 1+ bilaterally, resolving ecchymosis RLE MS- no significant deformity or atrophy Skin- warm and dry, no rash or lesion  Psych- euthymic mood, full affect Neuro- strength and sensation are intact   EKG: EKG is not ordered  today.   Recent Labs: 08/26/2015: Hemoglobin 12.8; Platelets 159 08/27/2015: BUN 12; Creatinine, Ser 0.83; Potassium 4.2; Sodium 137    Other studies Reviewed: Additional studies/ records that were reviewed today include: hospital records  Assessment and Plan: 1. Permanent atrial fibrillation Doing well s/p Watchman implant TEE 12/20 demonstrated well seated Watchman device with no leak or jet around device Discontinue Warfarin at this time Start Plavix 75mg  daily and continue ASA 325mg  daily until 6 month post implant (02/2015)  2. Symptomatic bradycardia Continue remote follow up  3. CAD No recent ischemic symptoms    Current medicines are reviewed at length with the patient today.  The patient does not have concerns regarding her medicines. The following changes were made today: Discontinue Warfarin, start Plavix 75mg  daily  Labs/ tests ordered today include: CBC, BMET   Disposition: Follow up with Dr Marlou Porch as scheduled, me in 02/2015    Signed, Chanetta Marshall, NP 10/13/2015 10:46 AM   [facility] [address] [place] [address] [phone] (office) [phone] (fax)  History and Physical Interval Note:  10/12/2015 1:56 PM  Heather May has presented today for their planned procedure. The various methods of treatment have been discussed with the patient and family. After consideration of risks, benefits and other options for treatment, the patient has consented to the procedure.  There have been no interval changes in the patient's history of physical exam findings today compared to her most recent office visit.  The patients' outpatient history has been reviewed, patient examined, and no change in status from most recent office note within the past 30 days. I have reviewed the patients' chart and labs and will proceed as planned. Questions were answered to the patient's satisfaction.   Pixie Casino, MD,  [facility] Attending Cardiologist [place] 10/12/2015, 1:56 PM

## 2015-10-12 NOTE — Discharge Instructions (Signed)

## 2015-10-12 NOTE — [place] Notes (Signed)
Electrophysiology Office Note Date: 10/13/2015  ID:  Heather May, DOB 06/28/1946, MRN [id number]  PCP: Iran Planas, MD Primary Cardiologist: Marlou Porch Electrophysiologist: Rayann Heman  CC: Watchman follow up  Heather May is a 69 y.o. female seen today for Dr Rayann Heman.  She presents today for 6 week watchman followup.  Since last being seen in clinic, the patient reports doing relatively well. She has not had any bleeding complications and is tolerating Warfarin/ASA.  She denies chest pain, palpitations, dyspnea, PND, orthopnea, nausea, vomiting, dizziness, syncope, edema, weight gain, or early satiety.  Past Medical History  Diagnosis Date  . CAD (coronary artery disease) 08/15/13  . Seizure ([place]) 2014    occured in setting of [place]  . Hypertension   . GERD (gastroesophageal reflux disease)   . Permanent atrial fibrillation (HCC)     chads2vasc score is at least 5  . Diabetes mellitus without complication ([place])   . Essential tremor   . Transient ischemic attack   . Subarachnoid hemorrhage ([place])   . Spinal stenosis   . Diverticulosis   . PAD (peripheral artery disease) ([place])   . Pulmonary embolism ([place])   . Percutaneous transluminal coronary angioplasty status   . Long-term (current) use of anticoagulants   . Hyperlipidemia     mixed  . History of long-term use of multiple prescription drugs   . Sick sinus syndrome ([place])   . Pulmonary fibrosis [facility])    Past Surgical History  Procedure Laterality Date  . Cardiac catheterization  10/14    PCI   . Basal cell carcinoma excision    . Cesarean section N/A 1978    x 1  . Hernia repair  06/2010  . Abdominal hysterectomy  2004  . Removal of big toe nail Bilateral   . Carpel tunnel surgery Bilateral 2006/ 2010  . Titanium pins inserted metatarsol bone  02/2012    2 pins placed   . Pacemaker insertion  03/05/15    [place] DR implanted [place]  . Tee without cardioversion N/A 08/16/2015    Procedure:  TRANSESOPHAGEAL ECHOCARDIOGRAM (TEE);  Surgeon: Pixie Casino, MD;  Location: [facility] ENDOSCOPY;  Service: Cardiovascular;  Laterality: N/A;  . Electrophysiologic study N/A 08/26/2015    LEFT ATRIAL APPENDAGE OCCLUSION;  WATCHMAN device implanted by Dr Rayann Heman  . Tee without cardioversion N/A 10/12/2015    Procedure: TRANSESOPHAGEAL ECHOCARDIOGRAM (TEE);  Surgeon: Pixie Casino, MD;  Location: [facility] ENDOSCOPY;  Service: Cardiovascular;  Laterality: N/A;    Current Outpatient Prescriptions  Medication Sig Dispense Refill  . aspirin 325 MG tablet Take 325 mg by mouth daily.    . benazepril (LOTENSIN) 10 MG tablet Take 10 mg by mouth daily.    [name] glipiZIDE (GLUCOTROL XL) 10 MG 24 hr tablet Take 10 mg by mouth 2 (two) times daily.     . lansoprazole (PREVACID) 15 MG capsule Take 15 mg by mouth daily.     [name] levETIRAcetam (KEPPRA) 500 MG tablet Take 500 mg by mouth 2 (two) times daily.    [name] lovastatin (MEVACOR) 40 MG tablet Take 40 mg by mouth at bedtime.    . Multiple Vitamin (MULTIVITAMIN) tablet Take 1 tablet by mouth daily.    . nitroGLYCERIN (NITROSTAT) 0.4 MG SL tablet Place 0.4 mg under the tongue every 5 (five) minutes as needed for chest pain.    [name] ondansetron (ZOFRAN) 4 MG tablet Take 4 mg by mouth every 8 (eight) hours as needed for  nausea or vomiting.    [name] OVER THE COUNTER MEDICATION Take 1 Dose by mouth daily. OTC Joint Juice "1,500 mg of glucosamine plus chondroitin, Vitamin C and Vitamin D"     No current facility-administered medications for this visit.    Allergies:   Sulfa antibiotics   Social History: Social History   Social History  . Marital Status: Widowed    Spouse Name: N/A  . Number of Children: N/A  . Years of Education: N/A   Occupational History  . Not on file.   Social History Main Topics  . Smoking status: Former Smoker    Quit date: 07/06/2009  . Smokeless tobacco: Not on file     Comment: didnt smoke much  . Alcohol Use: No     Comment: very infrequently    . Drug Use: No  . Sexual Activity: Not on file   Other Topics Concern  . Not on file   Social History Narrative   Pt recently moved to [facility] [place].  She now lived with her daughter.   Here today with her partner.   Previously worked as a Teacher, music.    Family History: Family History  Problem Relation Age of Onset  . Stroke Mother   . Epilepsy Mother   . Stroke Father   . Heart attack Father   . Atrial fibrillation Sister   . Atrial fibrillation Brother   . Diabetes Mellitus II Brother     Review of Systems: All other systems reviewed and are otherwise negative except as noted above.   Physical Exam: VS:  BP 128/80 mmHg  Pulse 62  Ht 5\' 6"  (1.676 m)  Wt 214 lb (97.07 kg)  BMI 34.56 kg/m2 , BMI Body mass index is 34.56 kg/(m^2). Wt Readings from Last 3 Encounters:  10/13/15 214 lb (97.07 kg)  08/26/15 210 lb (95.255 kg)  08/16/15 208 lb (94.348 kg)    GEN- The patient is elderly and obese appearing, alert and oriented x 3 today.   HEENT: normocephalic, atraumatic; sclera clear, conjunctiva pink; hearing intact; oropharynx clear; neck supple  Lungs- Clear to ausculation bilaterally, normal work of breathing.  No wheezes, rales, rhonchi Heart- Irregular rate and rhythm  GI- soft, non-tender, non-distended, bowel sounds present  Extremities- no clubbing, cyanosis, bilateral trace edema; DP/PT/radial pulses 1+ bilaterally, resolving ecchymosis RLE MS- no significant deformity or atrophy Skin- warm and dry, no rash or lesion  Psych- euthymic mood, full affect Neuro- strength and sensation are intact   EKG:  EKG is not ordered today.   Recent Labs: 08/26/2015: Hemoglobin 12.8; Platelets 159 08/27/2015: BUN 12; Creatinine, Ser 0.83; Potassium 4.2; Sodium 137    Other studies Reviewed: Additional studies/ records that were reviewed today include: hospital records  Assessment and Plan: 1.  Permanent atrial fibrillation Doing well s/p  Watchman implant TEE 12/20 demonstrated well seated Watchman device with no leak or jet around device Discontinue Warfarin at this time Start Plavix 75mg  daily and continue ASA 325mg  daily until 6 month post implant (02/2015)  2.  Symptomatic bradycardia Continue remote follow up  3.  CAD No recent ischemic symptoms    Current medicines are reviewed at length with the patient today.   The patient does not have concerns regarding her medicines.  The following changes were made today:  Discontinue Warfarin, start Plavix 75mg  daily  Labs/ tests ordered today include: CBC, BMET    Disposition:   Follow up with Dr Marlou Porch as scheduled,  me in 02/2015    Signed, Chanetta Marshall, NP 10/13/2015 10:46 AM   [facility] [address] [place] [address] [phone] (office) [phone] (fax)

## 2015-10-13 ENCOUNTER — Telehealth: Payer: Self-pay | Admitting: Cardiology

## 2015-10-13 ENCOUNTER — Ambulatory Visit (INDEPENDENT_AMBULATORY_CARE_PROVIDER_SITE_OTHER): Payer: Medicare Other | Admitting: Nurse Practitioner

## 2015-10-13 ENCOUNTER — Encounter (HOSPITAL_COMMUNITY): Payer: Self-pay | Admitting: Internal Medicine

## 2015-10-13 VITALS — BP 128/80 | HR 62 | Ht 66.0 in | Wt 214.0 lb

## 2015-10-13 DIAGNOSIS — I480 Paroxysmal atrial fibrillation: Secondary | ICD-10-CM

## 2015-10-13 DIAGNOSIS — R001 Bradycardia, unspecified: Secondary | ICD-10-CM | POA: Diagnosis not present

## 2015-10-13 DIAGNOSIS — I498 Other specified cardiac arrhythmias: Secondary | ICD-10-CM | POA: Diagnosis not present

## 2015-10-13 DIAGNOSIS — I4891 Unspecified atrial fibrillation: Secondary | ICD-10-CM | POA: Diagnosis not present

## 2015-10-13 LAB — BASIC METABOLIC PANEL
BUN: 15 mg/dL (ref 7–25)
CO2: 25 mmol/L (ref 20–31)
Calcium: 9.5 mg/dL (ref 8.6–10.4)
Chloride: 100 mmol/L (ref 98–110)
Creat: 0.74 mg/dL (ref 0.60–0.93)
Glucose, Bld: 303 mg/dL — ABNORMAL HIGH (ref 65–99)
Potassium: 4.2 mmol/L (ref 3.5–5.3)
Sodium: 136 mmol/L (ref 135–146)

## 2015-10-13 LAB — CBC
HCT: 39.6 % (ref 36.0–46.0)
Hemoglobin: 13 g/dL (ref 12.0–15.0)
MCH: 30.8 pg (ref 26.0–34.0)
MCHC: 32.8 g/dL (ref 30.0–36.0)
MCV: 93.8 fL (ref 78.0–100.0)
MPV: 9.6 fL (ref 8.6–12.4)
Platelets: 211 10*3/uL (ref 150–400)
RBC: 4.22 MIL/uL (ref 3.87–5.11)
RDW: 14.3 % (ref 11.5–15.5)
WBC: 6.3 10*3/uL (ref 4.0–10.5)

## 2015-10-13 MED ORDER — CLOPIDOGREL BISULFATE 75 MG PO TABS: 75.0000 mg | ORAL_TABLET | Freq: Every day | ORAL | Status: DC

## 2015-10-13 NOTE — Telephone Encounter (Signed)
-----   Message from Patsey Berthold, NP sent at 10/13/2015 10:55 AM EST ----- Can you make sure we are getting her Latitude remotes?  She says she sent one last week

## 2015-10-13 NOTE — Telephone Encounter (Signed)
Spoke w/ pt and requested that she send a manual transmission b/c we have not gotten one since 08-28-15. Pt stated that she would once she gets home.

## 2015-10-13 NOTE — Patient Instructions (Signed)
Medication Instructions:   STOP  COUMADIN  START  PLAVIX 75 MG ONCE A DAY     If you need a refill on your cardiac medications before your next appointment, please call your pharmacy.  Labwork: CBC BMET    Testing/Procedures: NONE ORDER TODAY    Follow-Up:    Your physician wants you to follow-up in:  [place] will receive a reminder letter in the mail two months in advance. If you don't receive a letter, please call our office to schedule the follow-up appointment.   Remote monitoring is used to monitor your Pacemaker of ICD from home. This monitoring reduces the number of office visits required to check your device to one time per year. It allows [place] to keep an eye on the functioning of your device to ensure it is working properly. You are scheduled for a device check from home on . 01/11/16 You may send your transmission at any time that day. If you have a wireless device, the transmission will be sent automatically. After your physician reviews your transmission, you will receive a postcard with your next transmission date.    Any Other Special Instructions Will Be Listed Below (If Applicable).

## 2015-10-14 ENCOUNTER — Telehealth: Payer: Self-pay | Admitting: *Deleted

## 2015-10-14 NOTE — Telephone Encounter (Signed)
-----   Message from Heather Berthold, NP sent at 10/13/2015  9:05 PM EST ----- Please notify patient of stable labs. She should follow up with PCP regarding elevated glucose.  She was non-fasting for these labs.

## 2015-10-17 ENCOUNTER — Other Ambulatory Visit: Payer: Self-pay | Admitting: Internal Medicine

## 2015-10-19 ENCOUNTER — Ambulatory Visit (INDEPENDENT_AMBULATORY_CARE_PROVIDER_SITE_OTHER): Payer: Medicare Other | Admitting: Cardiology

## 2015-10-19 DIAGNOSIS — Z5181 Encounter for therapeutic drug level monitoring: Secondary | ICD-10-CM

## 2015-10-19 DIAGNOSIS — I480 Paroxysmal atrial fibrillation: Secondary | ICD-10-CM

## 2015-11-01 DIAGNOSIS — E119 Type 2 diabetes mellitus without complications: Secondary | ICD-10-CM | POA: Diagnosis not present

## 2015-11-01 DIAGNOSIS — Z8679 Personal history of other diseases of the circulatory system: Secondary | ICD-10-CM | POA: Insufficient documentation

## 2015-11-01 DIAGNOSIS — G250 Essential tremor: Secondary | ICD-10-CM | POA: Insufficient documentation

## 2015-11-01 DIAGNOSIS — E118 Type 2 diabetes mellitus with unspecified complications: Secondary | ICD-10-CM | POA: Insufficient documentation

## 2015-11-01 DIAGNOSIS — I159 Secondary hypertension, unspecified: Secondary | ICD-10-CM | POA: Diagnosis not present

## 2015-11-01 DIAGNOSIS — I2510 Atherosclerotic heart disease of native coronary artery without angina pectoris: Secondary | ICD-10-CM | POA: Insufficient documentation

## 2015-11-03 DIAGNOSIS — M5030 Other cervical disc degeneration, unspecified cervical region: Secondary | ICD-10-CM | POA: Diagnosis not present

## 2015-11-03 DIAGNOSIS — I10 Essential (primary) hypertension: Secondary | ICD-10-CM | POA: Diagnosis not present

## 2015-11-03 DIAGNOSIS — E782 Mixed hyperlipidemia: Secondary | ICD-10-CM | POA: Diagnosis not present

## 2015-11-03 DIAGNOSIS — I480 Paroxysmal atrial fibrillation: Secondary | ICD-10-CM | POA: Diagnosis not present

## 2015-11-03 DIAGNOSIS — E1165 Type 2 diabetes mellitus with hyperglycemia: Secondary | ICD-10-CM | POA: Diagnosis not present

## 2015-11-03 DIAGNOSIS — I2510 Atherosclerotic heart disease of native coronary artery without angina pectoris: Secondary | ICD-10-CM | POA: Diagnosis not present

## 2016-01-03 ENCOUNTER — Ambulatory Visit (INDEPENDENT_AMBULATORY_CARE_PROVIDER_SITE_OTHER): Payer: Medicare Other | Admitting: Cardiology

## 2016-01-03 ENCOUNTER — Encounter: Payer: Self-pay | Admitting: Cardiology

## 2016-01-03 VITALS — BP 122/78 | HR 70 | Ht 66.0 in | Wt 211.0 lb

## 2016-01-03 DIAGNOSIS — I482 Chronic atrial fibrillation: Secondary | ICD-10-CM

## 2016-01-03 DIAGNOSIS — E1165 Type 2 diabetes mellitus with hyperglycemia: Secondary | ICD-10-CM | POA: Diagnosis not present

## 2016-01-03 DIAGNOSIS — R001 Bradycardia, unspecified: Secondary | ICD-10-CM

## 2016-01-03 DIAGNOSIS — Z950 Presence of cardiac pacemaker: Secondary | ICD-10-CM

## 2016-01-03 DIAGNOSIS — I4821 Permanent atrial fibrillation: Secondary | ICD-10-CM

## 2016-01-03 NOTE — Progress Notes (Signed)
Cardiology Office Note Date: 01/03/2016  ID:  Heather May, DOB September 05, 1946, MRN [id number]  PCP: Iran Planas, MD Primary Cardiologist: Marlou Porch Electrophysiologist: Rayann Heman  CC: Watchman follow up  Heather May is a 70 y.o. female seen today with watchman followup.  Since last being seen in clinic, the patient reports doing relatively well. She has not had any bleeding complications. She denies chest pain, palpitations, dyspnea, PND, orthopnea, nausea, vomiting, dizziness, syncope, edema, weight gain, or early satiety.  07/2013 - last PCI.   Moved from [place]. Daughter here.   Overall she's been doing well post watchman. No bleeding, no strokelike symptoms. She will occasionally have sensation of palpitations, rapid heartbeat but these are fleeting.  Past Medical History  Diagnosis Date  . CAD (coronary artery disease) 08/15/13  . Seizure ([place]) 2014    occured in setting of [place]  . Hypertension   . GERD (gastroesophageal reflux disease)   . Permanent atrial fibrillation (HCC)     chads2vasc score is at least 5  . Diabetes mellitus without complication ([place])   . Essential tremor   . Transient ischemic attack   . Subarachnoid hemorrhage ([place])   . Spinal stenosis   . Diverticulosis   . PAD (peripheral artery disease) ([place])   . Pulmonary embolism ([place])   . Percutaneous transluminal coronary angioplasty status   . Long-term (current) use of anticoagulants   . Hyperlipidemia     mixed  . History of long-term use of multiple prescription drugs   . Sick sinus syndrome ([place])   . Pulmonary fibrosis [facility])    Past Surgical History  Procedure Laterality Date  . Cardiac catheterization  10/14    PCI   . Basal cell carcinoma excision    . Cesarean section N/A 1978    x 1  . Hernia repair  06/2010  . Abdominal hysterectomy  2004  . Removal of big toe nail Bilateral   . Carpel tunnel surgery Bilateral 2006/ 2010  . Titanium pins inserted metatarsol bone  02/2012    2 pins placed     . Pacemaker insertion  03/05/15    [place] DR [place]  . Tee without cardioversion N/A 08/16/2015    Procedure: TRANSESOPHAGEAL ECHOCARDIOGRAM (TEE);  Surgeon: Pixie Casino, MD;  Location: [facility] ENDOSCOPY;  Service: Cardiovascular;  Laterality: N/A;  . Electrophysiologic study N/A 08/26/2015    LEFT ATRIAL APPENDAGE OCCLUSION;  WATCHMAN device implanted by Dr Rayann Heman  . Tee without cardioversion N/A 10/12/2015    Procedure: TRANSESOPHAGEAL ECHOCARDIOGRAM (TEE);  Surgeon: Pixie Casino, MD;  Location: [facility] ENDOSCOPY;  Service: Cardiovascular;  Laterality: N/A;    Current Outpatient Prescriptions  Medication Sig Dispense Refill  . aspirin 325 MG tablet Take 325 mg by mouth daily.    . benazepril (LOTENSIN) 10 MG tablet Take 10 mg by mouth daily.    . clopidogrel (PLAVIX) 75 MG tablet Take 1 tablet (75 mg total) by mouth daily. 30 tablet 6  . glipiZIDE (GLUCOTROL XL) 10 MG 24 hr tablet Take 10 mg by mouth 2 (two) times daily.     . lansoprazole (PREVACID) 15 MG capsule Take 15 mg by mouth daily.     [name] levETIRAcetam (KEPPRA) 500 MG tablet Take 500 mg by mouth 2 (two) times daily.    [name] lovastatin (MEVACOR) 40 MG tablet Take 40 mg by mouth at bedtime.    . Multiple Vitamin (MULTIVITAMIN) tablet Take 1 tablet by mouth daily.    [name]  nitroGLYCERIN (NITROSTAT) 0.4 MG SL tablet Place 0.4 mg under the tongue every 5 (five) minutes as needed for chest pain.    [name] ondansetron (ZOFRAN) 4 MG tablet Take 4 mg by mouth every 8 (eight) hours as needed for nausea or vomiting.    [name] OVER THE COUNTER MEDICATION Take 1 Dose by mouth daily. OTC Joint Juice "1,500 mg of glucosamine plus chondroitin, Vitamin C and Vitamin D"     No current facility-administered medications for this visit.    Allergies:   Sulfa antibiotics   Social History: Social History   Social History  . Marital Status: Widowed    Spouse Name: N/A  . Number of Children: N/A  . Years of Education:  N/A   Occupational History  . Not on file.   Social History Main Topics  . Smoking status: Former Smoker    Quit date: 07/06/2009  . Smokeless tobacco: Not on file     Comment: didnt smoke much  . Alcohol Use: No     Comment: very infrequently  . Drug Use: No  . Sexual Activity: Not on file   Other Topics Concern  . Not on file   Social History Narrative   Pt recently moved to [facility] [place].  She now lived with her daughter.   Here today with her partner.   Previously worked as a Teacher, music.    Family History: Family History  Problem Relation Age of Onset  . Stroke Mother   . Epilepsy Mother   . Stroke Father   . Heart attack Father   . Atrial fibrillation Sister   . Atrial fibrillation Brother   . Diabetes Mellitus II Brother     Review of Systems: All other systems reviewed and are otherwise negative except as noted above.   Physical Exam: VS:  There were no vitals taken for this visit. , BMI There is no weight on file to calculate BMI. Wt Readings from Last 3 Encounters:  10/13/15 214 lb (97.07 kg)  08/26/15 210 lb (95.255 kg)  08/16/15 208 lb (94.348 kg)    GEN- The patient is obese appearing, alert and oriented x 3 today.  Uses a specialized walker. HEENT: normocephalic, atraumatic; sclera clear, conjunctiva pink; hearing intact; oropharynx clear; neck supple  Lungs- Clear to ausculation bilaterally, normal work of breathing.  No wheezes, rales, rhonchi Heart- Irregular rate and rhythm, pacemaker noted GI- soft, non-tender, non-distended, bowel sounds present  Extremities- no clubbing, cyanosis, bilateral trace edema; DP/PT/radial pulses 1+ bilaterally, resolving ecchymosis RLE MS- no significant deformity or atrophy Skin- warm and dry, no rash or lesion  Psych- euthymic mood, full affect Neuro- strength and sensation are intact   EKG:  EKG is not ordered today.   Recent Labs: 10/13/2015: BUN 15; Creat 0.74; Hemoglobin 13.0;  Platelets 211; Potassium 4.2; Sodium 136    Other studies Reviewed: Additional studies/ records that were reviewed today include: hospital records  Assessment and Plan:  Permanent atrial fibrillation Doing well s/p Watchman implant TEE 12/20 demonstrated well seated Watchman device with no leak or jet around device Discontinued Warfarin Start Plavix 75mg  daily and continue ASA 325mg  daily until 6 month post implant (02/2016).I believe that she will go back to 81 mg of aspirin after this 6 month period. Dr. Concha [place] team to advise. She will have occasional rapid ventricular response. She told me that her sister died from atrial fibrillation. I said would be extremely uncommon for mortality to occur as result  of a fatal arrhythmia from atrial fibrillation and perhaps she had cardiac arrest from ventricular fibrillation.  Symptomatic bradycardia-pacemaker [facility]) Continue remote follow up She moved from [place]. She has not had an official pacemaker follow-up here.  CAD No recent ischemic symptoms  We discussed the pathophysiology behind Plavix and stent placement. She was concerned that if she stops her Plavix in the future that she may need another stent down the road. Had a long discussion about this.  Diabetes poorly controlled -She has an appointment with her primary provider, nurse practitioner under the guidance of Dr. Tamala Julian. -She states that her glucose of 303 previously was because she was not fasting. -I expressed the importance of good overall glucose control.   Current medicines are reviewed at length with the patient today.   The patient does not have concerns regarding her medicines.  The following changes were made today:  Discontinue Warfarin, start Plavix 75mg  daily  Labs/ tests ordered today include: none    Disposition:   Follow up with Dr Marlou Porch in one year  Signed, Candee Furbish, MD  01/03/2016 7:20 AM   [place] [address] [place] [address] [phone] (office) [phone] (fax)

## 2016-01-03 NOTE — Patient Instructions (Signed)

## 2016-01-28 DIAGNOSIS — I10 Essential (primary) hypertension: Secondary | ICD-10-CM | POA: Diagnosis not present

## 2016-01-28 DIAGNOSIS — E119 Type 2 diabetes mellitus without complications: Secondary | ICD-10-CM | POA: Diagnosis not present

## 2016-02-02 DIAGNOSIS — E119 Type 2 diabetes mellitus without complications: Secondary | ICD-10-CM | POA: Diagnosis not present

## 2016-02-02 DIAGNOSIS — Z298 Encounter for other specified prophylactic measures: Secondary | ICD-10-CM | POA: Insufficient documentation

## 2016-02-02 DIAGNOSIS — I10 Essential (primary) hypertension: Secondary | ICD-10-CM | POA: Diagnosis not present

## 2016-02-02 DIAGNOSIS — L570 Actinic keratosis: Secondary | ICD-10-CM | POA: Diagnosis not present

## 2016-02-02 DIAGNOSIS — J301 Allergic rhinitis due to pollen: Secondary | ICD-10-CM | POA: Diagnosis not present

## 2016-02-02 DIAGNOSIS — I480 Paroxysmal atrial fibrillation: Secondary | ICD-10-CM | POA: Diagnosis not present

## 2016-02-02 DIAGNOSIS — G514 Facial myokymia: Secondary | ICD-10-CM | POA: Insufficient documentation

## 2016-02-02 DIAGNOSIS — Z1231 Encounter for screening mammogram for malignant neoplasm of breast: Secondary | ICD-10-CM | POA: Diagnosis not present

## 2016-02-02 DIAGNOSIS — I2510 Atherosclerotic heart disease of native coronary artery without angina pectoris: Secondary | ICD-10-CM | POA: Diagnosis not present

## 2016-02-02 DIAGNOSIS — Z2989 Encounter for other specified prophylactic measures: Secondary | ICD-10-CM | POA: Insufficient documentation

## 2016-02-02 DIAGNOSIS — E782 Mixed hyperlipidemia: Secondary | ICD-10-CM | POA: Diagnosis not present

## 2016-03-13 NOTE — Progress Notes (Signed)
Electrophysiology Office Note Date: 03/14/2016  ID:  Heather May, DOB 1946/09/26, MRN [id number]  PCP: Iran Planas, MD Primary Cardiologist: Marlou Porch Electrophysiologist: Rayann Heman  CC: Watchman and pacemaker follow up  Heather May is a 70 y.o. female seen today for Dr Rayann Heman.  She presents today for routine electrophysiology follow up.  Since last being seen in clinic, the patient reports doing relatively well. She has not had any bleeding complications and is tolerating Plavix/ASA.  She denies chest pain, palpitations, dyspnea, PND, orthopnea, nausea, vomiting, dizziness, syncope, edema, weight gain, or early satiety.  Device History: BSX dual chamber PPM implanted 2016 for symptomatic bradycardia   Past Medical History  Diagnosis Date  . CAD (coronary artery disease) 08/15/13  . Seizure ([place]) 2014    occured in setting of [place]  . Hypertension   . GERD (gastroesophageal reflux disease)   . Permanent atrial fibrillation (HCC)     chads2vasc score is at least 5  . Diabetes mellitus without complication ([place])   . Essential tremor   . Transient ischemic attack   . Subarachnoid hemorrhage ([place])   . Spinal stenosis   . Diverticulosis   . PAD (peripheral artery disease) ([place])   . Pulmonary embolism ([place])   . Percutaneous transluminal coronary angioplasty status   . Long-term (current) use of anticoagulants   . Hyperlipidemia     mixed  . History of long-term use of multiple prescription drugs   . Sick sinus syndrome ([place])   . Pulmonary fibrosis [facility])    Past Surgical History  Procedure Laterality Date  . Cardiac catheterization  10/14    PCI   . Basal cell carcinoma excision    . Cesarean section N/A 1978    x 1  . Hernia repair  06/2010  . Abdominal hysterectomy  2004  . Removal of big toe nail Bilateral   . Carpel tunnel surgery Bilateral 2006/ 2010  . Titanium pins inserted metatarsol bone  02/2012    2 pins placed   . Pacemaker insertion  03/05/15    [place] DR implanted [place]  . Tee without cardioversion N/A 08/16/2015    Procedure: TRANSESOPHAGEAL ECHOCARDIOGRAM (TEE);  Surgeon: Pixie Casino, MD;  Location: [facility] ENDOSCOPY;  Service: Cardiovascular;  Laterality: N/A;  . Electrophysiologic study N/A 08/26/2015    LEFT ATRIAL APPENDAGE OCCLUSION;  WATCHMAN device implanted by Dr Rayann Heman  . Tee without cardioversion N/A 10/12/2015    Procedure: TRANSESOPHAGEAL ECHOCARDIOGRAM (TEE);  Surgeon: Pixie Casino, MD;  Location: [facility] ENDOSCOPY;  Service: Cardiovascular;  Laterality: N/A;    Current Outpatient Prescriptions  Medication Sig Dispense Refill  . aspirin 325 MG tablet Take 325 mg by mouth daily.    . benazepril (LOTENSIN) 10 MG tablet Take 10 mg by mouth daily.    . cetirizine (ZYRTEC) 10 MG tablet   5  . glipiZIDE (GLUCOTROL XL) 10 MG 24 hr tablet Take 10 mg by mouth 2 (two) times daily.     . lansoprazole (PREVACID) 15 MG capsule Take 15 mg by mouth daily.     [name] levETIRAcetam (KEPPRA) 500 MG tablet Take 500 mg by mouth 2 (two) times daily.    [name] lovastatin (MEVACOR) 40 MG tablet Take 40 mg by mouth at bedtime.    . Multiple Vitamin (MULTIVITAMIN) tablet Take 1 tablet by mouth daily.    . nitroGLYCERIN (NITROSTAT) 0.4 MG SL tablet Place 0.4 mg under the tongue every 5 (five) minutes as  needed for chest pain.    [name] ondansetron (ZOFRAN) 4 MG tablet Take 4 mg by mouth every 8 (eight) hours as needed for nausea or vomiting.    [name] OVER THE COUNTER MEDICATION Take 1 Dose by mouth daily. OTC Joint Juice "1,500 mg of glucosamine plus chondroitin, Vitamin C and Vitamin D"    . metFORMIN (GLUCOPHAGE) 500 MG tablet Take 500 mg by mouth 2 (two) times daily with a meal.      No current facility-administered medications for this visit.    Allergies:   Sulfa antibiotics   Social History: Social History   Social History  . Marital Status: Widowed    Spouse Name: N/A  . Number of Children: N/A  . Years of Education:  N/A   Occupational History  . Not on file.   Social History Main Topics  . Smoking status: Former Smoker    Quit date: 07/06/2009  . Smokeless tobacco: Not on file     Comment: didnt smoke much  . Alcohol Use: No     Comment: very infrequently  . Drug Use: No  . Sexual Activity: Not on file   Other Topics Concern  . Not on file   Social History Narrative   Pt recently moved to [facility] [place].  She now lived with her daughter.   Here today with her partner.   Previously worked as a Teacher, music.    Family History: Family History  Problem Relation Age of Onset  . Stroke Mother   . Epilepsy Mother   . Stroke Father   . Heart attack Father   . Atrial fibrillation Sister   . Atrial fibrillation Brother   . Diabetes Mellitus II Brother     Review of Systems: All other systems reviewed and are otherwise negative except as noted above.   Physical Exam: VS:  BP 104/66 mmHg  Pulse 60  Ht 5\' 6"  (1.676 m)  Wt 212 lb (96.163 kg)  BMI 34.23 kg/m2 , BMI Body mass index is 34.23 kg/(m^2). Wt Readings from Last 3 Encounters:  03/14/16 212 lb (96.163 kg)  01/03/16 211 lb (95.709 kg)  10/13/15 214 lb (97.07 kg)    GEN- The patient is elderly and obese appearing, alert and oriented x 3 today.   HEENT: normocephalic, atraumatic; sclera clear, conjunctiva pink; hearing intact; oropharynx clear; neck supple  Lungs- Clear to ausculation bilaterally, normal work of breathing.  No wheezes, rales, rhonchi Heart- Irregular rate and rhythm  GI- soft, non-tender, non-distended, bowel sounds present  Extremities- no clubbing, cyanosis, bilateral trace edema; DP/PT pulses 1+ bilaterally MS- no significant deformity or atrophy Skin- warm and dry, no rash or lesion  Psych- euthymic mood, full affect Neuro- strength and sensation are intact   EKG:  EKG is not ordered today.   Recent Labs: 10/13/2015: BUN 15; Creat 0.74; Hemoglobin 13.0; Platelets 211; Potassium  4.2; Sodium 136    Other studies Reviewed: Additional studies/ records that were reviewed today include: hospital records  Assessment and Plan: 1.  Permanent atrial fibrillation S/p Watchman 08/2015 Discontinue Plavix at this time Continue ASA 325mg  daily CHADS2VASC is 5  2.  Symptomatic bradycardia Normal pacemaker function today See PaceArt report Continue remote follow up  3.  CAD No recent ischemic symptoms  Last Xience DES was in 2014 As above, discontinue Plavix at this time with history of ICH - discussed with Dr Rayann Heman today    Current medicines are reviewed at length with the  patient today.   The patient does not have concerns regarding her medicines.  The following changes were made today:  Discontinue Plavix  Labs/ tests ordered today include: none  Disposition:   Follow up with Dr Marlou Porch as scheduled, [place], me in 6 months    Signed, Chanetta Marshall, NP 03/14/2016 1:48 PM   [place] [address] [phone] (office) [phone] (fax)

## 2016-03-14 ENCOUNTER — Encounter: Payer: Self-pay | Admitting: Internal Medicine

## 2016-03-14 ENCOUNTER — Encounter: Payer: Self-pay | Admitting: Nurse Practitioner

## 2016-03-14 ENCOUNTER — Encounter: Payer: Medicare Other | Admitting: Nurse Practitioner

## 2016-03-14 ENCOUNTER — Ambulatory Visit (INDEPENDENT_AMBULATORY_CARE_PROVIDER_SITE_OTHER): Payer: Medicare Other | Admitting: Nurse Practitioner

## 2016-03-14 VITALS — BP 104/66 | HR 60 | Ht 66.0 in | Wt 212.0 lb

## 2016-03-14 DIAGNOSIS — I481 Persistent atrial fibrillation: Secondary | ICD-10-CM

## 2016-03-14 DIAGNOSIS — R001 Bradycardia, unspecified: Secondary | ICD-10-CM | POA: Diagnosis not present

## 2016-03-14 DIAGNOSIS — I4819 Other persistent atrial fibrillation: Secondary | ICD-10-CM

## 2016-03-14 NOTE — Patient Instructions (Addendum)
Medication Instructions:   STOP TAKING  PLAVIX   If you need a refill on your cardiac medications before your next appointment, please call your pharmacy.  Labwork: NONE ORDER TODAY    Testing/Procedures: NONE ORDER TODAY    Follow-Up:  Remote monitoring is used to monitor your Pacemaker of ICD from home. This monitoring reduces the number of office visits required to check your device to one time per year. It allows [place] to keep an eye on the functioning of your device to ensure it is working properly. You are scheduled for a device check from home on..06/15/16..You may send your transmission at any time that day. If you have a wireless device, the transmission will be sent automatically. After your physician reviews your transmission, you will receive a postcard with your next transmission date.  Your physician wants you to follow-up in:  [place] will receive a reminder letter in the mail two months in advance. If you don't receive a letter, please call our office to schedule the follow-up appointment.      Any Other Special Instructions Will Be Listed Below (If Applicable).

## 2016-04-14 ENCOUNTER — Encounter (HOSPITAL_COMMUNITY): Payer: Self-pay | Admitting: Internal Medicine

## 2016-05-25 ENCOUNTER — Other Ambulatory Visit: Payer: Self-pay | Admitting: Family Medicine

## 2016-05-25 DIAGNOSIS — I639 Cerebral infarction, unspecified: Secondary | ICD-10-CM | POA: Diagnosis not present

## 2016-05-25 DIAGNOSIS — E119 Type 2 diabetes mellitus without complications: Secondary | ICD-10-CM | POA: Diagnosis not present

## 2016-05-25 DIAGNOSIS — G252 Other specified forms of tremor: Secondary | ICD-10-CM | POA: Diagnosis not present

## 2016-05-25 DIAGNOSIS — R569 Unspecified convulsions: Secondary | ICD-10-CM | POA: Diagnosis not present

## 2016-05-25 DIAGNOSIS — Z1231 Encounter for screening mammogram for malignant neoplasm of breast: Secondary | ICD-10-CM

## 2016-06-05 ENCOUNTER — Ambulatory Visit
Admission: RE | Admit: 2016-06-05 | Discharge: 2016-06-05 | Disposition: A | Discharge status: Home / Self Care | Payer: Medicare Other | Source: Ambulatory Visit | Attending: Family Medicine | Admitting: Family Medicine

## 2016-06-05 DIAGNOSIS — Z1231 Encounter for screening mammogram for malignant neoplasm of breast: Secondary | ICD-10-CM

## 2016-06-07 DIAGNOSIS — E7800 Pure hypercholesterolemia, unspecified: Secondary | ICD-10-CM | POA: Diagnosis not present

## 2016-06-07 DIAGNOSIS — Z79899 Other long term (current) drug therapy: Secondary | ICD-10-CM | POA: Diagnosis not present

## 2016-06-07 DIAGNOSIS — I10 Essential (primary) hypertension: Secondary | ICD-10-CM | POA: Diagnosis not present

## 2016-06-07 DIAGNOSIS — E119 Type 2 diabetes mellitus without complications: Secondary | ICD-10-CM | POA: Diagnosis not present

## 2016-06-12 DIAGNOSIS — I10 Essential (primary) hypertension: Secondary | ICD-10-CM | POA: Diagnosis not present

## 2016-06-12 DIAGNOSIS — J111 Influenza due to unidentified influenza virus with other respiratory manifestations: Secondary | ICD-10-CM | POA: Diagnosis not present

## 2016-06-12 DIAGNOSIS — E119 Type 2 diabetes mellitus without complications: Secondary | ICD-10-CM | POA: Diagnosis not present

## 2016-06-12 DIAGNOSIS — E7800 Pure hypercholesterolemia, unspecified: Secondary | ICD-10-CM | POA: Diagnosis not present

## 2016-06-14 ENCOUNTER — Ambulatory Visit (INDEPENDENT_AMBULATORY_CARE_PROVIDER_SITE_OTHER): Payer: Medicare Other | Admitting: Neurology

## 2016-06-14 ENCOUNTER — Encounter: Payer: Self-pay | Admitting: Neurology

## 2016-06-14 VITALS — BP 120/69 | HR 62 | Ht 66.0 in | Wt 217.4 lb

## 2016-06-14 DIAGNOSIS — G629 Polyneuropathy, unspecified: Secondary | ICD-10-CM | POA: Diagnosis not present

## 2016-06-14 DIAGNOSIS — R251 Tremor, unspecified: Secondary | ICD-10-CM

## 2016-06-14 DIAGNOSIS — R569 Unspecified convulsions: Secondary | ICD-10-CM | POA: Diagnosis not present

## 2016-06-14 DIAGNOSIS — I609 Nontraumatic subarachnoid hemorrhage, unspecified: Secondary | ICD-10-CM

## 2016-06-14 DIAGNOSIS — S0990XA Unspecified injury of head, initial encounter: Secondary | ICD-10-CM

## 2016-06-14 DIAGNOSIS — E538 Deficiency of other specified B group vitamins: Secondary | ICD-10-CM | POA: Diagnosis not present

## 2016-06-14 NOTE — Patient Instructions (Signed)
Remember to drink plenty of fluid, eat healthy meals and do not skip any meals. Try to eat protein with a every meal and eat a healthy snack such as fruit or nuts in between meals. Try to keep a regular sleep-wake schedule and try to exercise daily, particularly in the form of walking, 20-30 minutes a day, if you can.   As far as diagnostic testing: CT of the head and labs  I would like to see you back in 6 months, sooner if we need to. Please call us with any interim questions, concerns, problems, updates or refill requests.   Our phone number is [phone]. We also have an after hours call service for urgent matters and there is a physician on-call for urgent questions. For any emergencies you know to call 911 or go to the nearest emergency room

## 2016-06-14 NOTE — Progress Notes (Signed)
[facility]    Provider:  Dr Jaynee Eagles Referring Provider: Rachell Cipro, MD Primary Care Physician:  Rachell Cipro, MD  CC:  Tremors, stroke, seizures and other multiple neurologic complaints  HPI:  Heather May is a 70 y.o. female here as a referral from Dr. Tamala Julian for multiple neurologic complaints. Patient has a past medical history of atrial fibrillation, CAD s/p stents,  bradycardia s/p pacemaker, type 2 diabetes, hyperlipidemia, essential tremor, cerebral infarction, hypertension, peripheral vascular disease, spinal stenosis, pain in limb,. She is on aspirin 325 and Keppra. Also on lovastatin. She had a watchman device placed in 08/26/15 by Dr Rayann Heman. She follows with [facility]. She moved from [place] a year and here to establish new care. She had a SAH, she had LOC and neightbor found her laying in the driveway. No one witnessed her fall or a seizure but  there was vomit and blood on the driveway, occurred in 2014 without inciting event or new medications or previous illness. Her partner is here and provides information. She went ot the hospital and had a witnessed seizure as well. For 3 minutes. She has been on keppra since then without any events. Elliot Dally are mostly in the head. She was initially diagnosed with parkinson's disease but later was changed to essential tremor. No resting hand tremor, no hypophonia or dec sense of smell or other symptoms of PD as she went to support groups and did not think she was like the PD patients. She has a head bob. She has a tremor for years in 2014 slowly worsening not significantly bothering her. Tremor started in 2014 after falling out of her wheelchair and hitting her head. Stable. She does water yoga, silver sneakers once a week, walks the dogs with partner. She uses a walker for stability. No FHx of tremors.   Reviewed notes, labs and imaging from outside physicians, which showed: Per current pcp notes, Patient has stroke and  tremor and wants to get off seizure meds per primary care. Per notes she has unspecified convulsions. She is on Keppra 500 mg twice a day. She is also on aspirin and statin. Patient had a hemorrhagic stroke with subsequent seizure and she has not had an episode since then and would like to know if she should continue medication. She's had a resting tremor for a number of years and was diagnosed with Parkinson's. Stress can make her head shaky. Notes also make reference to benign essential tremor.Per notes from [facility] 2017, she has diabetes that has been uncontrolled in the past, at one point she could not afford a glucometer, no eye exam in 12 years, A1c high, recently started on Metformin, continued on glipizide.  Last hgba1c 8.2 10/2015,   Sodium 140 135 - 146 MMOL/L  Potassium 4.4 3.5 - 5.3 MMOL/L  Chloride 102 98 - 110 MMOL/L  CO2 29 21 - 33 MMOL/L  BUN 17 8 - 24 MG/DL  Glucose 226 (H) 70 - 99 MG/DL  Creatinine 0.77 0.50 - 1.50 MG/DL  Calcium 9.8 8.5 - 10.5 MG/DL  Total Protein 7.5 6.0 - 8.3 G/DL  Albumin 4.3 3.5 - 5.0 G/DL  Total Bilirubin 0.6 0.1 - 1.2 MG/DL  Alkaline Phosphatase 120 25 - 125 IU/L  AST (SGOT) 36 5 - 40 IU/L  ALT (SGPT) 16 5 - 50 IU/L  Anion Gap 9 4 - 14  Est. GFR Non-Black >60  Est. GFR Black >60       CT of the head in 2007 at  [facility] per report: Findings:  There is no evidence for mass effect, midline shift, or extra-axial fluid collections.There is no evidence for space-occupying lesion or intracranial hemorrhage. There is no evidence for acute cortical-based area of infarction.Ventricles and sulci are appropriate for the patient's age. The basal cisterns are patent. No fractures. There may be minimal soft tissue swelling over the left supraorbital region.  Visualized portions of the orbits are unremarkable. The visualized paranasal sinuses and mastoids are unremarkable.  Impression: 1. No acute intracranial process.  Review of Systems: Patient  complains of symptoms per HPI as well as the following symptoms: joint pain, ringing in the ears. Pertinent negatives per HPI. All others negative.   Social History   Social History  . Marital status: Widowed    Spouse name: N/A  . Number of children: 5  . Years of education: 12   Occupational History  . REtired    Social History Main Topics  . Smoking status: Former Smoker    Quit date: 07/06/2009  . Smokeless tobacco: Never Used     Comment: didnt smoke much  . Alcohol use No     Comment: very infrequently  . Drug use: No  . Sexual activity: Not on file   Other Topics Concern  . Not on file   Social History Narrative   Pt recently moved to [facility] [place].     She now lives with her daughter.   Here today with her partner.   Previously worked as a Teacher, music.   Caffeine use: only when eating out tea/soda    Family History  Problem Relation Age of Onset  . Stroke Mother   . Epilepsy Mother   . Stroke Father   . Heart attack Father   . Atrial fibrillation Sister   . Atrial fibrillation Brother   . Diabetes Mellitus II Brother     Past Medical History:  Diagnosis Date  . CAD (coronary artery disease) 08/15/13  . Diabetes mellitus without complication ([place])   . Diverticulosis   . Essential tremor   . GERD (gastroesophageal reflux disease)   . History of long-term use of multiple prescription drugs   . Hyperlipidemia    mixed  . Hypertension   . Long-term (current) use of anticoagulants   . PAD (peripheral artery disease) ([place])   . Percutaneous transluminal coronary angioplasty status   . Permanent atrial fibrillation (HCC)    chads2vasc score is at least 5  . Pulmonary embolism ([place])   . Pulmonary fibrosis ([place])   . Seizure ([place]) 2014   occured in setting of [place]  . Sick sinus syndrome ([place])   . Spinal stenosis   . Subarachnoid hemorrhage ([place])   . Transient ischemic attack     Past Surgical History:  Procedure Laterality Date    . ABDOMINAL HYSTERECTOMY  2004  . BASAL CELL CARCINOMA EXCISION    . CARDIAC CATHETERIZATION  10/14   PCI   . carpel tunnel surgery Bilateral 2006/ 2010  . CESAREAN SECTION N/A 1978   x 1  . HERNIA REPAIR  06/2010  . LEFT ATRIAL APPENDAGE OCCLUSION N/A 08/26/2015   Procedure: LEFT ATRIAL APPENDAGE OCCLUSION;  Surgeon: Thompson Grayer, MD;  Location: [facility];  Service: Cardiovascular;  Laterality: N/A;  . PACEMAKER INSERTION  03/05/15   [name] Accolade DR implanted in [place]  . removal of big toe nail Bilateral   . TEE WITHOUT CARDIOVERSION N/A 08/16/2015   Procedure:  TRANSESOPHAGEAL ECHOCARDIOGRAM (TEE);  Surgeon: Pixie Casino, MD;  Location: [facility] ENDOSCOPY;  Service: Cardiovascular;  Laterality: N/A;  . TEE WITHOUT CARDIOVERSION N/A 10/12/2015   Procedure: TRANSESOPHAGEAL ECHOCARDIOGRAM (TEE);  Surgeon: Pixie Casino, MD;  Location: [facility] ENDOSCOPY;  Service: Cardiovascular;  Laterality: N/A;  . titanium pins inserted metatarsol bone  02/2012   2 pins placed     Current Outpatient Prescriptions  Medication Sig Dispense Refill  . aspirin 325 MG tablet Take 325 mg by mouth daily.    . benazepril (LOTENSIN) 10 MG tablet Take 10 mg by mouth daily.    . cetirizine (ZYRTEC) 10 MG tablet   5  . glipiZIDE (GLUCOTROL XL) 10 MG 24 hr tablet Take 10 mg by mouth 2 (two) times daily.     . lansoprazole (PREVACID) 15 MG capsule Take 15 mg by mouth daily.     [name] levETIRAcetam (KEPPRA) 500 MG tablet Take 500 mg by mouth 2 (two) times daily.    [name] lovastatin (MEVACOR) 40 MG tablet Take 40 mg by mouth at bedtime.    . Multiple Vitamin (MULTIVITAMIN) tablet Take 1 tablet by mouth daily.    . nitroGLYCERIN (NITROSTAT) 0.4 MG SL tablet Place 0.4 mg under the tongue every 5 (five) minutes as needed for chest pain.    [name] ondansetron (ZOFRAN) 4 MG tablet Take 4 mg by mouth every 8 (eight) hours as needed for nausea or vomiting.    [name] OVER THE COUNTER MEDICATION Take 1 Dose by mouth  daily. OTC Joint Juice "1,500 mg of glucosamine plus chondroitin, Vitamin C and Vitamin D"    . metFORMIN (GLUCOPHAGE) 500 MG tablet Take 850 mg by mouth 2 (two) times daily with a meal.      No current facility-administered medications for this visit.     Allergies as of 06/14/2016 - Review Complete 06/14/2016  Allergen Reaction Noted  . Sulfa antibiotics  07/07/2015    Vitals: BP 120/69 (BP Location: Right Arm, Patient Position: Sitting, Cuff Size: Normal)   Pulse 62   Ht 5\' 6"  (1.676 m)   Wt 217 lb 6.4 oz (98.6 kg)   BMI 35.09 kg/m  Last Weight:  Wt Readings from Last 1 Encounters:  06/14/16 217 lb 6.4 oz (98.6 kg)   Last Height:   Ht Readings from Last 1 Encounters:  06/14/16 5\' 6"  (1.676 m)     Physical exam: Exam: Gen: NAD, conversant, well nourised, obese, well groomed                     CV: RRR, no MRG. No Carotid Bruits. No peripheral edema, warm, nontender Eyes: Conjunctivae clear without exudates or hemorrhage  Neuro: Detailed Neurologic Exam  Speech:    Speech is normal; fluent and spontaneous with normal comprehension.  Cognition:    The patient is oriented to person, place, and time;     recent and remote memory intact;     language fluent;     normal attention, concentration,     fund of knowledge Cranial Nerves:    The pupils are equal, round, and reactive to light.Attempted but could not visualize fundi due to small pupils.  Visual fields are full to finger confrontation. Extraocular movements are intact. Trigeminal sensation is intact and the muscles of mastication are normal. Right lid assymmetry(chronic for 50 years and stable) right ptosis and right NL flattening. The palate elevates in the midline. Hearing intact. Voice is tremulous as well. Shoulder shrug is normal.  The tongue has normal motion without fasciculations.   Coordination:    No dysmetria  Gait: difficulty getting out of seat. Stooped due to stenosis, antalgic due to knee and back  pain.   Motor Observation: Head bobbing, bilateral leg tremor, no significant postural or resting hand tremor some with intention on FTN     Tone:    Normal muscle tone.  No cogwheeling.   Posture:    Posture is stooped    Strength:    Strength is V/V in the upper and lower limbs.      Sensation: decreased pp and temp distally in the feet, few seconds vibrtion  intact prroprioception.      Reflex Exam:  DTR's: upper right 2+ and 1+ upper left, hypo patellatrs absent AJs.      Toes:    The toes are equivocal bilaterally.   Clonus:    Clonus is absent.     Assessment/Plan:  70 y.o. female here as a referral from Dr. Tamala Julian for multiple neurologic complaints. Patient has a past medical history of atrial fibrillation, CAD s/p stents,  bradycardia s/p pacemaker, type 2 diabetes, hyperlipidemia, essential tremor, cerebral infarction, hypertension, peripheral vascular disease, spinal stenosis, pain in limb,  Had a long discussion about seizures. We can perform a CT of the head and also an EEG. Seizure happened due to a reported [place] in 2014. She has been free of any events since then on Keppra. She would like to get off of keppra. Will see what CT of the head and EEG shows. If both unremarkable then it is likely sage to go off of the keppra however cannot ensure she will not have another seizure with can be dangerous and cause morbidity of mortality.Discussed at length with partner as well.   She appears to have essential tremor, she is not bothered by it, do not recommend any treatment at this time, monitor clinically  She has peripheral neuropathy from diabetes, not significantly causing her pain  Follow with cardiology for afib. She understands she has an increased risk of stroke with afib. Continue asa 325. She has a Multimedia programmer.   Follow closely with pcp for management of vascular risk factors.  maintain strict control of hypertension with blood pressure goal below 130/90,  diabetes with hemoglobin A1c goal below 6.5% and lipids with LDL cholesterol goal below 70 mg/dL. . I also advised the patient to eat a healthy diet with plenty of whole grains, cereals, fruits and vegetables, exercise regularly and maintain ideal body weight .  Cc: Dr. Elonda Husky, MD  [facility] [address] [place], [place] [address]  Phone [phone] Fax [phone]

## 2016-06-15 ENCOUNTER — Telehealth: Payer: Self-pay | Admitting: *Deleted

## 2016-06-15 NOTE — Telephone Encounter (Signed)
Request faxed to Dr. Gale Journey requesting pt office notes and imaging.

## 2016-06-19 LAB — MULTIPLE MYELOMA PANEL, SERUM
Albumin SerPl Elph-Mcnc: 3.8 g/dL (ref 2.9–4.4)
Albumin/Glob SerPl: 1.2 (ref 0.7–1.7)
Alpha 1: 0.2 g/dL (ref 0.0–0.4)
Alpha2 Glob SerPl Elph-Mcnc: 0.8 g/dL (ref 0.4–1.0)
B-Globulin SerPl Elph-Mcnc: 1.3 g/dL (ref 0.7–1.3)
Gamma Glob SerPl Elph-Mcnc: 1.1 g/dL (ref 0.4–1.8)
Globulin, Total: 3.4 g/dL (ref 2.2–3.9)
IgA/Immunoglobulin A, Serum: 282 mg/dL (ref 64–422)
IgG (Immunoglobin G), Serum: 1029 mg/dL (ref 700–1600)
IgM (Immunoglobulin M), Srm: 93 mg/dL (ref 26–217)
Total Protein: 7.2 g/dL (ref 6.0–8.5)

## 2016-06-19 LAB — TSH: TSH: 1.47 u[IU]/mL (ref 0.450–4.500)

## 2016-06-19 LAB — B12 AND FOLATE PANEL
Folate: >20 ng/mL (ref >3.0)
Vitamin B-12: 648 pg/mL (ref 211–946)

## 2016-06-30 ENCOUNTER — Ambulatory Visit
Admission: RE | Admit: 2016-06-30 | Discharge: 2016-06-30 | Disposition: A | Discharge status: Home / Self Care | Payer: Medicare Other | Source: Ambulatory Visit | Attending: Neurology | Admitting: Neurology

## 2016-06-30 DIAGNOSIS — I609 Nontraumatic subarachnoid hemorrhage, unspecified: Secondary | ICD-10-CM | POA: Diagnosis not present

## 2016-06-30 DIAGNOSIS — R569 Unspecified convulsions: Secondary | ICD-10-CM | POA: Diagnosis not present

## 2016-06-30 DIAGNOSIS — S0990XA Unspecified injury of head, initial encounter: Secondary | ICD-10-CM

## 2016-07-04 ENCOUNTER — Telehealth: Payer: Self-pay | Admitting: *Deleted

## 2016-07-04 NOTE — Telephone Encounter (Signed)
-----   Message from Melvenia Beam, MD sent at 07/03/2016  5:03 PM EDT ----- Brain is normal for age. There is some atherosclerotic changes in the blood vessels nothing that is causing vascular compromise however thanks

## 2016-07-04 NOTE — Telephone Encounter (Signed)
LVM for pt to call about results. Gave [facility] phone number.  

## 2016-07-04 NOTE — Telephone Encounter (Signed)
Spoke to patient. Some calcifications noted in the vertebral arteries. Will call her with eeg. We can discuss the head pain at next appointment she does nothave any other symptoms of temporal arteritis no vision changes, jaw pain, this has been ongoing, recommend coming in to see me or taking to primary care in the next few weeks.

## 2016-07-04 NOTE — Telephone Encounter (Signed)
Dr Jaynee Eagles- Pt would like you to call and discuss CT results in more detail  Called and spoke to pt about CT head results. She verbalized understanding but wants to know more details about imaging. She would like a call from Dr Jaynee Eagles to discuss. I offered to make f/u to go over imaging but she declined.  She also stated she has a "charlie horse" that comes and goes on the left side of her head. There is nothing that she can do to help alleviate sx. States it only lasts for a couple min. She has had this for the last couple months. She has not discussed this with PCP before. She is not sure if she talked with Dr Jaynee Eagles about this before. Advised I will speak to Dr Jaynee Eagles and call back to advise. She verbalized understanding.

## 2016-07-10 ENCOUNTER — Ambulatory Visit (INDEPENDENT_AMBULATORY_CARE_PROVIDER_SITE_OTHER): Payer: Medicare Other | Admitting: Neurology

## 2016-07-10 DIAGNOSIS — I609 Nontraumatic subarachnoid hemorrhage, unspecified: Secondary | ICD-10-CM

## 2016-07-10 DIAGNOSIS — R569 Unspecified convulsions: Secondary | ICD-10-CM

## 2016-07-10 DIAGNOSIS — S0990XA Unspecified injury of head, initial encounter: Secondary | ICD-10-CM

## 2016-07-19 NOTE — Procedures (Signed)
   HISTORY: Female, with history of stroke, subarachnoid hemorrhage, seizure,  TECHNIQUE:  16 channel EEG was performed based on standard 10-16 international system. One channel was dedicated to EKG, which has demonstrates normal sinus rhythm of 72 beats per minutes.  Upon awakening, the posterior background activity was well-developed, in alpha range, 8 Hz , reactive to eye opening and closure.  There was no evidence of epileptiform discharge.  Photic stimulation was performed, which induced a symmetric photic driving.  Hyperventilation was not performed.  No sleep was achieved.  CONCLUSION: This is a  normal awake EEG.  There is no electrodiagnostic evidence of epileptiform discharge.  Marcial Pacas, M.D. Ph.D.  [facility] [place], [place] [address] Phone: [phone] Fax:      [phone]

## 2016-07-20 ENCOUNTER — Telehealth: Payer: Self-pay | Admitting: *Deleted

## 2016-07-20 NOTE — Telephone Encounter (Signed)
LVM for pt about normal EEG per Dr Jaynee Eagles. Gave [facility] phone number if she has further questions.

## 2016-07-20 NOTE — Telephone Encounter (Signed)
-----   Message from Melvenia Beam, MD sent at 07/20/2016  1:02 PM EDT ----- EEG normal

## 2016-09-04 DIAGNOSIS — I639 Cerebral infarction, unspecified: Secondary | ICD-10-CM | POA: Diagnosis not present

## 2016-09-04 DIAGNOSIS — E119 Type 2 diabetes mellitus without complications: Secondary | ICD-10-CM | POA: Diagnosis not present

## 2016-09-04 DIAGNOSIS — I10 Essential (primary) hypertension: Secondary | ICD-10-CM | POA: Diagnosis not present

## 2016-09-04 DIAGNOSIS — Z23 Encounter for immunization: Secondary | ICD-10-CM | POA: Diagnosis not present

## 2016-09-04 DIAGNOSIS — Z6835 Body mass index (BMI) 35.0-35.9, adult: Secondary | ICD-10-CM | POA: Diagnosis not present

## 2016-10-17 ENCOUNTER — Ambulatory Visit (INDEPENDENT_AMBULATORY_CARE_PROVIDER_SITE_OTHER): Payer: Medicare Other | Admitting: *Deleted

## 2016-10-17 DIAGNOSIS — R001 Bradycardia, unspecified: Secondary | ICD-10-CM | POA: Diagnosis not present

## 2016-10-19 LAB — CUP PACEART REMOTE DEVICE CHECK
Battery Remaining Longevity: 90 mo
Battery Remaining Percentage: 100 %
Brady Statistic RA Percent Paced: 0 %
Brady Statistic RV Percent Paced: 63 %
Date Time Interrogation Session: 20171226202000
Implantable Pulse Generator Implant Date: 20160513
Lead Channel Impedance Value: 349 Ohm
Lead Channel Impedance Value: 651 Ohm
Lead Channel Pacing Threshold Amplitude: 1.7 V
Lead Channel Pacing Threshold Pulse Width: 0.4 ms
Lead Channel Setting Pacing Amplitude: 2.4 V
Lead Channel Setting Pacing Pulse Width: 0.4 ms
Lead Channel Setting Sensing Sensitivity: 0.6 mV
Pulse Gen Serial Number: 732886

## 2016-10-19 NOTE — Progress Notes (Signed)
Remote pacemaker transmission.   

## 2016-10-20 ENCOUNTER — Encounter: Payer: Self-pay | Admitting: Cardiology

## 2016-11-27 DIAGNOSIS — I10 Essential (primary) hypertension: Secondary | ICD-10-CM | POA: Diagnosis not present

## 2016-11-27 DIAGNOSIS — E119 Type 2 diabetes mellitus without complications: Secondary | ICD-10-CM | POA: Diagnosis not present

## 2016-11-27 DIAGNOSIS — Z6835 Body mass index (BMI) 35.0-35.9, adult: Secondary | ICD-10-CM | POA: Diagnosis not present

## 2016-11-27 NOTE — Progress Notes (Signed)
Electrophysiology Office Note Date: 11/29/2016  ID:  Heather May, DOB 24-Aug-1946, MRN [id number]  PCP: Rachell Cipro, MD Primary Cardiologist: Marlou Porch Electrophysiologist: Rayann Heman  CC: Watchman and pacemaker follow up  Heather May is a 71 y.o. female seen today for Dr Rayann Heman.  She presents today for routine electrophysiology follow up.  Since last being seen in clinic, the patient reports doing relatively well. She has not had any bleeding complications and is tolerating ASA. She has had occasional palpitations that are not associated with other symptoms.  She denies chest pain, dyspnea, PND, orthopnea, nausea, vomiting, dizziness, syncope, edema, weight gain, or early satiety.  Device History: BSX dual chamber PPM implanted 2016 for symptomatic bradycardia   Past Medical History:  Diagnosis Date  . CAD (coronary artery disease) 08/15/13  . Diabetes mellitus without complication ([place])   . Diverticulosis   . Essential tremor   . GERD (gastroesophageal reflux disease)   . History of long-term use of multiple prescription drugs   . Hyperlipidemia    mixed  . Hypertension   . Long-term (current) use of anticoagulants   . PAD (peripheral artery disease) ([place])   . Percutaneous transluminal coronary angioplasty status   . Permanent atrial fibrillation (HCC)    chads2vasc score is at least 5  . Pulmonary embolism ([place])   . Pulmonary fibrosis ([place])   . Seizure ([place]) 2014   occured in setting of [place]  . Sick sinus syndrome ([place])   . Spinal stenosis   . Subarachnoid hemorrhage ([place])   . Transient ischemic attack    Past Surgical History:  Procedure Laterality Date  . ABDOMINAL HYSTERECTOMY  2004  . BASAL CELL CARCINOMA EXCISION    . CARDIAC CATHETERIZATION  10/14   PCI   . carpel tunnel surgery Bilateral 2006/ 2010  . CESAREAN SECTION N/A 1978   x 1  . HERNIA REPAIR  06/2010  . LEFT ATRIAL APPENDAGE OCCLUSION N/A 08/26/2015   Procedure: LEFT ATRIAL APPENDAGE OCCLUSION;   Surgeon: Thompson Grayer, MD;  Location: [facility];  Service: Cardiovascular;  Laterality: N/A;  . PACEMAKER INSERTION  03/05/15   [name] Accolade DR implanted in [place]  . removal of big toe nail Bilateral   . TEE WITHOUT CARDIOVERSION N/A 08/16/2015   Procedure: TRANSESOPHAGEAL ECHOCARDIOGRAM (TEE);  Surgeon: Pixie Casino, MD;  Location: [facility] ENDOSCOPY;  Service: Cardiovascular;  Laterality: N/A;  . TEE WITHOUT CARDIOVERSION N/A 10/12/2015   Procedure: TRANSESOPHAGEAL ECHOCARDIOGRAM (TEE);  Surgeon: Pixie Casino, MD;  Location: [facility] ENDOSCOPY;  Service: Cardiovascular;  Laterality: N/A;  . titanium pins inserted metatarsol bone  02/2012   2 pins placed     Current Outpatient Prescriptions  Medication Sig Dispense Refill  . aspirin 325 MG tablet Take 325 mg by mouth daily.    . benazepril (LOTENSIN) 10 MG tablet Take 10 mg by mouth daily.    . cetirizine (ZYRTEC) 10 MG tablet   5  . glipiZIDE (GLUCOTROL XL) 10 MG 24 hr tablet Take 10 mg by mouth 2 (two) times daily.     . lansoprazole (PREVACID) 15 MG capsule Take 15 mg by mouth daily.     [name] levETIRAcetam (KEPPRA) 500 MG tablet Take 500 mg by mouth 2 (two) times daily.    [name] lovastatin (MEVACOR) 40 MG tablet Take 40 mg by mouth at bedtime.    . metFORMIN (GLUCOPHAGE) 850 MG tablet Take 850 mg by mouth 2 (two) times daily with a meal.    .  Multiple Vitamin (MULTIVITAMIN) tablet Take 1 tablet by mouth daily.    . nitroGLYCERIN (NITROSTAT) 0.4 MG SL tablet Place 0.4 mg under the tongue every 5 (five) minutes as needed for chest pain.    [name] ondansetron (ZOFRAN) 4 MG tablet Take 4 mg by mouth every 8 (eight) hours as needed for nausea or vomiting.    [name] OVER THE COUNTER MEDICATION Take 1 Dose by mouth daily. OTC Joint Juice "1,500 mg of glucosamine plus chondroitin, Vitamin C and Vitamin D"     No current facility-administered medications for this visit.     Allergies:   Sulfa antibiotics   Social  History: Social History   Social History  . Marital status: Widowed    Spouse name: N/A  . Number of children: 5  . Years of education: 12   Occupational History  . REtired    Social History Main Topics  . Smoking status: Former Smoker    Quit date: 07/06/2009  . Smokeless tobacco: Never Used     Comment: didnt smoke much  . Alcohol use No     Comment: very infrequently  . Drug use: No  . Sexual activity: Not on file   Other Topics Concern  . Not on file   Social History Narrative   Pt recently moved to [facility] [place]     She now lives with her daughter.   Here today with her partner.   Previously worked as a Teacher, music.   Caffeine use: only when eating out tea/soda    Family History: Family History  Problem Relation Age of Onset  . Stroke Mother   . Epilepsy Mother   . Stroke Father   . Heart attack Father   . Atrial fibrillation Sister   . Atrial fibrillation Brother   . Diabetes Mellitus II Brother     Review of Systems: All other systems reviewed and are otherwise negative except as noted above.   Physical Exam: VS:  BP 114/66   Pulse 68   Ht 5\' 6"  (1.676 m)   Wt 217 lb (98.4 kg)   BMI 35.02 kg/m  , BMI Body mass index is 35.02 kg/m. Wt Readings from Last 3 Encounters:  11/29/16 217 lb (98.4 kg)  06/14/16 217 lb 6.4 oz (98.6 kg)  03/14/16 212 lb (96.2 kg)    GEN- The patient is elderly and obese appearing, alert and oriented x 3 today.   HEENT: normocephalic, atraumatic; sclera clear, conjunctiva pink; hearing intact; oropharynx clear; neck supple  Lungs- Clear to ausculation bilaterally, normal work of breathing.  No wheezes, rales, rhonchi Heart- Irregular rate and rhythm  GI- soft, non-tender, non-distended, bowel sounds present  Extremities- no clubbing, cyanosis, bilateral trace edema; DP/PT pulses 1+ bilaterally MS- no significant deformity or atrophy Skin- warm and dry, no rash or lesion  Psych- euthymic mood,  full affect Neuro- strength and sensation are intact   EKG:  EKG is not ordered today.   Recent Labs: 06/14/2016: TSH 1.470    Other studies Reviewed: Additional studies/ records that were reviewed today include: hospital records  Assessment and Plan: 1.  Permanent atrial fibrillation S/p Watchman 08/2015 Continue ASA 325mg  daily CHADS2VASC is 5 Some increase in palpitations. Will give Cardizem 30mg  prn as needed for palpitations. Pt advised to call the office if symptom burden increasers.   2.  Symptomatic bradycardia Normal pacemaker function today See PaceArt report Continue remote follow up  3.  CAD No recent ischemic symptoms  Last Xience DES was in 2014  Current medicines are reviewed at length with the patient today.   The patient does not have concerns regarding her medicines.  The following changes were made today:  none  Labs/ tests ordered today include: none  Disposition:   Follow up with Dr Marlou Porch as scheduled, [place], Dr Rayann Heman 1 year    Signed, Chanetta Marshall, NP 11/29/2016 12:12 PM   [facility] [address] [place] [address] [phone] (office) [phone] (fax)

## 2016-11-29 ENCOUNTER — Encounter: Payer: Self-pay | Admitting: Nurse Practitioner

## 2016-11-29 ENCOUNTER — Ambulatory Visit (INDEPENDENT_AMBULATORY_CARE_PROVIDER_SITE_OTHER): Payer: Medicare Other | Admitting: Nurse Practitioner

## 2016-11-29 ENCOUNTER — Encounter (INDEPENDENT_AMBULATORY_CARE_PROVIDER_SITE_OTHER): Payer: Self-pay

## 2016-11-29 VITALS — BP 114/66 | HR 68 | Ht 66.0 in | Wt 217.0 lb

## 2016-11-29 DIAGNOSIS — R001 Bradycardia, unspecified: Secondary | ICD-10-CM

## 2016-11-29 DIAGNOSIS — I2510 Atherosclerotic heart disease of native coronary artery without angina pectoris: Secondary | ICD-10-CM | POA: Diagnosis not present

## 2016-11-29 DIAGNOSIS — I482 Chronic atrial fibrillation: Secondary | ICD-10-CM | POA: Diagnosis not present

## 2016-11-29 DIAGNOSIS — I4821 Permanent atrial fibrillation: Secondary | ICD-10-CM

## 2016-11-29 MED ORDER — DILTIAZEM HCL 30 MG PO TABS: 30.0000 mg | ORAL_TABLET | Freq: Every day | ORAL | 2 refills | Status: DC | PRN

## 2016-11-29 NOTE — Patient Instructions (Signed)
Medication Instructions:  Your physician has recommended you make the following change in your medication:  START Cardizem 30mg  daiyy as needed for palpitations. An Rx has been sent to your pharmacy   Labwork: None ordered  Testing/Procedures: None ordered  Follow-Up: Remote monitoring is used to monitor your Pacemaker of ICD from home. This monitoring reduces the number of office visits required to check your device to one time per year. It allows [place] to keep an eye on the functioning of your device to ensure it is working properly. You are scheduled for a device check from home on 02/28/17. You may send your transmission at any time that day. If you have a wireless device, the transmission will be sent automatically. After your physician reviews your transmission, you will receive a postcard with your next transmission date.  Your physician wants you to follow-up in: 1 year with Dr.Allred You will receive a reminder letter in the mail two months in advance. If you don't receive a letter, please call our office to schedule the follow-up appointment.   Any Other Special Instructions Will Be Listed Below (If Applicable).     If you need a refill on your cardiac medications before your next appointment, please call your pharmacy.

## 2016-11-30 LAB — CUP PACEART INCLINIC DEVICE CHECK
Date Time Interrogation Session: 20180208081314
Implantable Pulse Generator Implant Date: 20160513
Pulse Gen Serial Number: 732886

## 2016-12-18 ENCOUNTER — Ambulatory Visit (INDEPENDENT_AMBULATORY_CARE_PROVIDER_SITE_OTHER): Payer: Medicare Other | Admitting: Neurology

## 2016-12-18 ENCOUNTER — Encounter: Payer: Self-pay | Admitting: Neurology

## 2016-12-18 VITALS — BP 110/72 | HR 60 | Ht 66.0 in | Wt 220.4 lb

## 2016-12-18 DIAGNOSIS — I2510 Atherosclerotic heart disease of native coronary artery without angina pectoris: Secondary | ICD-10-CM

## 2016-12-18 DIAGNOSIS — R5383 Other fatigue: Secondary | ICD-10-CM

## 2016-12-18 DIAGNOSIS — R569 Unspecified convulsions: Secondary | ICD-10-CM

## 2016-12-18 NOTE — Progress Notes (Signed)
[facility]    Provider:  Dr Jaynee Eagles Referring Provider: [name], MD Primary Care Physician:  Rachell Cipro, MD  CC:  Tremors, stroke, seizures, neuropathy    Interval history 12/18/2016: Heather May is a 71 y.o. female here as a referral from Dr. Tamala Julian for multiple neurologic complaints. Today we will address a new issue of imbalance and falls. 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  Patient has a past medical history of atrial fibrillation, CAD s/p stents,  bradycardia s/p pacemaker, type 2 diabetes, hyperlipidemia, essential tremor, cerebral infarction, hypertension, peripheral vascular disease, spinal stenosis, pain in limb, seizure.She walks, go to the gym, go to movies, socially active. CT of the head She nods off a lot during the day and naps. She wakes at night frequently. But ESS only 5/10. She has dry mouth in the morning and difficulty swallowing. Occ wakes herself up snoring. Discussed CT head and EEG unremarkable and stopping keppra, cannot be certain she will not have another seizure. Patient will think about it.   HPI:  Heather May is a 71 y.o. female here as a referral from Dr. Tamala Julian for multiple neurologic complaints. Patient has a past medical history of atrial fibrillation, CAD s/p stents,  bradycardia s/p pacemaker, type 2 diabetes, hyperlipidemia, essential tremor, cerebral infarction, hypertension, peripheral vascular disease, spinal stenosis, pain in limb,. She is on aspirin 325 and Keppra. Also on lovastatin. She had a watchman device placed in 08/26/15 by Dr Rayann Heman. She follows with [facility]. She moved from [place] a year and here to establish new care. She had a SAH, she had LOC and neightbor found her laying in the driveway. No one witnessed her fall or a seizure but  there was vomit and blood on the driveway, occurred in 2014 without inciting event or new medications or  previous illness. Her partner is here and provides information. She went ot the hospital and had a witnessed seizure as well. For 3 minutes. She has been on keppra since then without any events. Elliot Dally are mostly in the head. She was initially diagnosed with parkinson's disease but later was changed to essential tremor. No resting hand tremor, no hypophonia or dec sense of smell or other symptoms of PD as she went to support groups and did not think she was like the PD patients. She has a head bob. She has a tremor for years in 2014 slowly worsening not significantly bothering her. Tremor started in 2014 after falling out of her wheelchair and hitting her head. Stable. She does water yoga, silver sneakers once a week, walks the dogs with partner. She uses a walker for stability. No FHx of tremors.   Reviewed notes, labs and imaging from outside physicians, which showed: Per current pcp notes, Patient has stroke and tremor and wants to get off seizure meds per primary care. Per notes she has unspecified convulsions. She is on Keppra 500 mg twice a day. She is also on aspirin and statin. Patient had a hemorrhagic stroke with subsequent seizure and she has not had an episode since then and would like to know if she should continue medication. She's had a resting tremor for a number of years and was diagnosed with Parkinson's. Stress can make her head shaky. Notes also make reference to benign essential tremor.Per notes from [facility] 2017, she has diabetes that has been uncontrolled in the past, at one point she could not afford  a glucometer, no eye exam in 12 years, A1c high, recently started on Metformin, continued on glipizide.  Last hgba1c 8.2 10/2015,   Sodium 140 135 - 146 MMOL/L  Potassium 4.4 3.5 - 5.3 MMOL/L  Chloride 102 98 - 110 MMOL/L  CO2 29 21 - 33 MMOL/L  BUN 17 8 - 24 MG/DL  Glucose 226 (H) 70 - 99 MG/DL  Creatinine 0.77 0.50 - 1.50 MG/DL  Calcium 9.8 8.5 - 10.5 MG/DL  Total Protein  7.5 6.0 - 8.3 G/DL  Albumin 4.3 3.5 - 5.0 G/DL  Total Bilirubin 0.6 0.1 - 1.2 MG/DL  Alkaline Phosphatase 120 25 - 125 IU/L  AST (SGOT) 36 5 - 40 IU/L  ALT (SGPT) 16 5 - 50 IU/L  Anion Gap 9 4 - 14  Est. GFR Non-Black >60  Est. GFR Black >60    CT of the head in 2007 at [facility] per report: Findings:  There is no evidence for mass effect, midline shift, or extra-axial fluid collections.There is no evidence for space-occupying lesion or intracranial hemorrhage. There is no evidence for acute cortical-based area of infarction.Ventricles and sulci are appropriate for the patient's age. The basal cisterns are patent. No fractures. There may be minimal soft tissue swelling over the left supraorbital region.  Visualized portions of the orbits are unremarkable. The visualized paranasal sinuses and mastoids are unremarkable.  Impression: 1. No acute intracranial process.  Review of Systems: Patient complains of symptoms per HPI as well as the following symptoms: joint pain, ringing in the ears. Pertinent negatives per HPI. All others negative.  Social History   Social History  . Marital status: Widowed    Spouse name: N/A  . Number of children: 5  . Years of education: 12   Occupational History  . Retired    Social History Main Topics  . Smoking status: Former Smoker    Quit date: 07/06/2009  . Smokeless tobacco: Never Used     Comment: didnt smoke much  . Alcohol use No     Comment: very infrequently  . Drug use: No  . Sexual activity: Not on file   Other Topics Concern  . Not on file   Social History Narrative   Pt recently moved to [facility] [place].    Here today with her partner.   Previously worked as a Teacher, music.   Caffeine use: only when eating out tea/soda   Right-handed    Family History  Problem Relation Age of Onset  . Stroke Mother   . Epilepsy Mother   . Stroke Father   . Heart attack Father   . Atrial fibrillation Sister   .  Atrial fibrillation Brother   . Diabetes Mellitus II Brother     Past Medical History:  Diagnosis Date  . CAD (coronary artery disease) 08/15/13  . Diabetes mellitus without complication ([place])   . Diverticulosis   . Essential tremor   . GERD (gastroesophageal reflux disease)   . History of long-term use of multiple prescription drugs   . Hyperlipidemia    mixed  . Hypertension   . Long-term (current) use of anticoagulants   . PAD (peripheral artery disease) ([place])   . Percutaneous transluminal coronary angioplasty status   . Permanent atrial fibrillation (H[facility])    chads2vasc score is at least 5  . Pulmonary embolism ([place])   . Pulmonary fibrosis ([place])   . Seizure ([place]) 2014   occured in setting of [place]  . Sick sinus  syndrome ([place])   . Spinal stenosis   . Subarachnoid hemorrhage ([place])   . Transient ischemic attack     Past Surgical History:  Procedure Laterality Date  . ABDOMINAL HYSTERECTOMY  2004  . BASAL CELL CARCINOMA EXCISION    . CARDIAC CATHETERIZATION  10/14   PCI   . carpel tunnel surgery Bilateral 2006/ 2010  . CESAREAN SECTION N/A 1978   x 1  . HERNIA REPAIR  06/2010  . LEFT ATRIAL APPENDAGE OCCLUSION N/A 08/26/2015   Procedure: LEFT ATRIAL APPENDAGE OCCLUSION;  Surgeon: Thompson Grayer, MD;  Location: [facility];  Service: Cardiovascular;  Laterality: N/A;  . PACEMAKER INSERTION  03/05/15   [name] Accolade DR implanted in [place]  . removal of big toe nail Bilateral   . TEE WITHOUT CARDIOVERSION N/A 08/16/2015   Procedure: TRANSESOPHAGEAL ECHOCARDIOGRAM (TEE);  Surgeon: Pixie Casino, MD;  Location: [facility];  Service: Cardiovascular;  Laterality: N/A;  . TEE WITHOUT CARDIOVERSION N/A 10/12/2015   Procedure: TRANSESOPHAGEAL ECHOCARDIOGRAM (TEE);  Surgeon: Pixie Casino, MD;  Location: [facility] END[facility]COPY;  Service: Cardiovascular;  Laterality: N/A;  . titanium pins inserted metatarsol bone  02/2012   2 pins placed     Current  Outpatient Prescriptions  Medication Sig Dispense Refill  . aspirin 325 MG tablet Take 325 mg by mouth daily.    . benazepril (LOTENSIN) 10 MG tablet Take 10 mg by mouth daily.    . cetirizine (ZYRTEC) 10 MG tablet   5  . diltiazem (CARDIZEM) 30 MG tablet Take 1 tablet (30 mg total) by mouth daily as needed. 30 tablet 2  . glipiZIDE (GLUCOTROL XL) 10 MG 24 hr tablet Take 10 mg by mouth 2 (two) times daily.     . lansoprazole (PREVACID) 15 MG capsule Take 15 mg by mouth daily.     [name] levETIRAcetam (KEPPRA) 500 MG tablet Take 500 mg by mouth 2 (two) times daily.    [name] lovastatin (MEVACOR) 40 MG tablet Take 40 mg by mouth at bedtime.    . metFORMIN (GLUCOPHAGE) 850 MG tablet Take 850 mg by mouth 2 (two) times daily with a meal.    . Multiple Vitamin (MULTIVITAMIN) tablet Take 1 tablet by mouth daily.    . nitroGLYCERIN (NITROSTAT) 0.4 MG SL tablet Place 0.4 mg under the tongue every 5 (five) minutes as needed for chest pain.    [name] ondansetron (ZOFRAN) 4 MG tablet Take 4 mg by mouth every 8 (eight) hours as needed for nausea or vomiting.    [name] OVER THE COUNTER MEDICATION Take 1 Dose by mouth daily. OTC Joint Juice "1,500 mg of glucosamine plus chondroitin, Vitamin C and Vitamin D"     No current facility-administered medications for this visit.     Allergies as of 12/18/2016 - Review Complete 12/18/2016  Allergen Reaction Noted  . Sulfa antibiotics  07/07/2015    Vitals: BP 110/72   Pulse 60   Ht 5\' 6"  (1.676 m)   Wt 220 lb 6.4 oz (100 kg)   BMI 35.57 kg/m  Last Weight:  Wt Readings from Last 1 Encounters:  12/18/16 220 lb 6.4 oz (100 kg)   Last Height:   Ht Readings from Last 1 Encounters:  12/18/16 5\' 6"  (1.676 m)     Physical exam: Exam: Gen: NAD, conversant, well nourised, obese, well groomed                     CV: RRR,  no MRG. No Carotid Bruits. No peripheral edema, warm, nontender Eyes: Conjunctivae clear without exudates or hemorrhage  Neuro: Detailed Neurologic  Exam  Speech:    Speech is normal; fluent and spontaneous with normal comprehension.  Cognition:    The patient is oriented to person, place, and time;     recent and remote memory intact;     language fluent;     normal attention, concentration,     fund of knowledge Cranial Nerves:    The pupils are equal, round, and reactive to light.Attempted but could not visualize fundi due to small pupils.  Visual fields are full to finger confrontation. Extraocular movements are intact. Trigeminal sensation is intact and the muscles of mastication are normal. Right lid assymmetry(chronic for 50 years and stable) right ptosis and right NL flattening. The palate elevates in the midline. Hearing intact. Voice is tremulous as well. Shoulder shrug is normal. The tongue has normal motion without fasciculations.   Coordination:    No dysmetria  Gait: difficulty getting out of seat. Stooped due to stenosis, antalgic due to knee and back pain.   Motor Observation: Head bobbing, bilateral leg tremor, no significant postural or resting hand tremor some with intention on FTN     Tone:    Normal muscle tone.  No cogwheeling.   Posture:    Posture is stooped    Strength:    Strength is V/V in the upper and lower limbs.      Sensation: decreased pp and temp distally in the feet, few seconds vibrtion  intact prroprioception.      Reflex Exam:  DTR's: upper right 2+ and 1+ upper left, hypo patellatrs absent AJs.      Toes:    The toes are equivocal bilaterally.   Clonus:    Clonus is absent.     Assessment/Plan:  71 y.o. female here as a referral from Dr. Tamala Julian for multiple neurologic complaints. Patient has a past medical history of atrial fibrillation, CAD s/p stents,  bradycardia s/p pacemaker, type 2 diabetes, hyperlipidemia, essential tremor, cerebral infarction, hypertension, peripheral vascular disease, spinal stenosis, pain in limb,  Had a long discussion about seizures. Seizure  happened due to a reported [place] in 2014. She has been free of any events since then on Keppra. She would like to get off of keppra. CT of the head and EEG were unremarkable.  is likely safe to go off of the keppra however cannot ensure she will not have another seizure which can be dangerous and cause morbidity of mortality.Discussed at length with partner as well: CT of the head was normal for age, EEG normal. She will think about it  Fatigue: Epworth sleepiness scale was 5/10, no indication for sleep eval at this time but discussed things to watch for and discuss with her partner fo example witnessed apneic events should call me   She appears to have essential tremor, she is not bothered by it, do not recommend any treatment at this time, monitor clinically  She has peripheral neuropathy from diabetes, not significantly causing her pain  Follow with cardiology for afib. She understands she has an increased risk of stroke with afib. Continue asa 325. She has a Multimedia programmer.   Follow closely with pcp for management of vascular risk factors.  maintain strict control of hypertension with blood pressure goal below 130/90, diabetes with hemoglobin A1c goal below 6.5% and lipids with LDL cholesterol goal below 70 mg/dL. . I also advised the patient  to eat a healthy diet with plenty of whole grains, cereals, fruits and vegetables, exercise regularly and maintain ideal body weight .  Cc: Dr. Elonda Husky, MD  [facility] [address] [place], [place] [address]  Phone [phone] Fax [phone]  A total of 30 minutes was spent face-to-face with this patient. Over half this time was spent on counseling patient on the fatigue, seizure diagnosis and different diagnostic and therapeutic options available.

## 2016-12-18 NOTE — Patient Instructions (Signed)
Remember to drink plenty of fluid, eat healthy meals and do not skip any meals. Try to eat protein with a every meal and eat a healthy snack such as fruit or nuts in between meals. Try to keep a regular sleep-wake schedule and try to exercise daily, particularly in the form of walking, 20-30 minutes a day, if you can.   As far as your medications are concerned, I would like to suggest: Continue Keppra  I would like to see you back in 1 year, sooner if we need to. Please call us with any interim questions, concerns, problems, updates or refill requests.   Our phone number is [phone]. We also have an after hours call service for urgent matters and there is a physician on-call for urgent questions. For any emergencies you know to call 911 or go to the nearest emergency room  Levetiracetam tablets What is this medicine? LEVETIRACETAM (lee ve tye RA se tam) is an antiepileptic drug. It is used with other medicines to treat certain types of seizures. This medicine may be used for other purposes; ask your health care provider or pharmacist if you have questions. COMMON BRAND NAME(S): Keppra, Roweepra What should I tell my health care provider before I take this medicine? They need to know if you have any of these conditions: -kidney disease -suicidal thoughts, plans, or attempt; a previous suicide attempt by you or a family member -an unusual or allergic reaction to levetiracetam, other medicines, foods, dyes, or preservatives -pregnant or trying to get pregnant -breast-feeding How should I use this medicine? Take this medicine by mouth with a glass of water. Follow the directions on the prescription label. Swallow the tablets whole. Do not crush or chew this medicine. You may take this medicine with or without food. Take your doses at regular intervals. Do not take your medicine more often than directed. Do not stop taking this medicine or any of your seizure medicines unless instructed by your  doctor or health care professional. Stopping your medicine suddenly can increase your seizures or their severity. A special MedGuide will be given to you by the pharmacist with each prescription and refill. Be sure to read this information carefully each time. Contact your pediatrician or health care professional regarding the use of this medication in children. While this drug may be prescribed for children as young as 31 years of age for selected conditions, precautions do apply. Overdosage: If you think you have taken too much of this medicine contact a poison control center or emergency room at once. NOTE: This medicine is only for you. Do not share this medicine with others. What if I miss a dose? If you miss a dose, take it as soon as you can. If it is almost time for your next dose, take only that dose. Do not take double or extra doses. What may interact with this medicine? This medicine may interact with the following medications: -carbamazepine -colesevelam -probenecid -sevelamer This list may not describe all possible interactions. Give your health care provider a list of all the medicines, herbs, non-prescription drugs, or dietary supplements you use. Also tell them if you smoke, drink alcohol, or use illegal drugs. Some items may interact with your medicine. What should I watch for while using this medicine? Visit your doctor or health care professional for a regular check on your progress. Wear a medical identification bracelet or chain to say you have epilepsy, and carry a card that lists all your medications. It is important  to take this medicine exactly as instructed by your health care professional. When first starting treatment, your dose may need to be adjusted. It may take weeks or months before your dose is stable. You should contact your doctor or health care professional if your seizures get worse or if you have any new types of seizures. You may get drowsy or dizzy. Do not  drive, use machinery, or do anything that needs mental alertness until you know how this medicine affects you. Do not stand or sit up quickly, especially if you are an older patient. This reduces the risk of dizzy or fainting spells. Alcohol may interfere with the effect of this medicine. Avoid alcoholic drinks. The use of this medicine may increase the chance of suicidal thoughts or actions. Pay special attention to how you are responding while on this medicine. Any worsening of mood, or thoughts of suicide or dying should be reported to your health care professional right away. Women who become pregnant while using this medicine may enroll in the [place] Pregnancy Registry by calling [phone]. This registry collects information about the safety of antiepileptic drug use during pregnancy. What side effects may I notice from receiving this medicine? Side effects that you should report to your doctor or health care professional as soon as possible: -allergic reactions like skin rash, itching or hives, swelling of the face, lips, or tongue -breathing problems -dark urine -general ill feeling or flu-like symptoms -problems with balance, talking, walking -unusually weak or tired -worsening of mood, thoughts or actions of suicide or dying -yellowing of the eyes or skin Side effects that usually do not require medical attention (report to your doctor or health care professional if they continue or are bothersome): -diarrhea -dizzy, drowsy -headache -loss of appetite This list may not describe all possible side effects. Call your doctor for medical advice about side effects. You may report side effects to FDA at 1-800-FDA-1088. Where should I keep my medicine? Keep out of reach of children. Store at room temperature between 15 and 30 degrees C (59 and 86 degrees F). Throw away any unused medicine after the expiration date. NOTE: This sheet is a summary. It may not cover  all possible information. If you have questions about this medicine, talk to your doctor, pharmacist, or health care provider.  2017 Elsevier/Gold Standard (2015-11-11 09:43:54)

## 2016-12-26 ENCOUNTER — Encounter: Payer: Self-pay | Admitting: Cardiology

## 2017-01-03 ENCOUNTER — Ambulatory Visit (INDEPENDENT_AMBULATORY_CARE_PROVIDER_SITE_OTHER): Payer: Medicare Other | Admitting: Cardiology

## 2017-01-03 ENCOUNTER — Encounter: Payer: Self-pay | Admitting: Cardiology

## 2017-01-03 VITALS — BP 102/60 | HR 70 | Ht 66.0 in | Wt 221.0 lb

## 2017-01-03 DIAGNOSIS — I482 Chronic atrial fibrillation: Secondary | ICD-10-CM

## 2017-01-03 DIAGNOSIS — E118 Type 2 diabetes mellitus with unspecified complications: Secondary | ICD-10-CM | POA: Diagnosis not present

## 2017-01-03 DIAGNOSIS — Z950 Presence of cardiac pacemaker: Secondary | ICD-10-CM

## 2017-01-03 DIAGNOSIS — I10 Essential (primary) hypertension: Secondary | ICD-10-CM

## 2017-01-03 DIAGNOSIS — I4821 Permanent atrial fibrillation: Secondary | ICD-10-CM

## 2017-01-03 DIAGNOSIS — I2510 Atherosclerotic heart disease of native coronary artery without angina pectoris: Secondary | ICD-10-CM

## 2017-01-03 NOTE — Patient Instructions (Signed)
Medication Instructions:  The current medical regimen is effective;  continue present plan and medications.  Follow-Up: Follow up in 2 years with Dr. Skains.  You will receive a letter in the mail 2 months before you are due.  Please call us when you receive this letter to schedule your follow up appointment.  If you need a refill on your cardiac medications before your next appointment, please call your pharmacy.  Thank you for choosing [facility]!!     

## 2017-01-03 NOTE — Progress Notes (Signed)
Cardiology Office Note Date: 01/03/2017  ID:  Heather May, DOB 1946-06-20, MRN [id number]  PCP: Heather Cipro, MD Primary Cardiologist: Heather May Electrophysiologist: Heather May  CC:  follow up  Heather May is a 71 y.o. female seen today with watchman followup.  Since last being seen in clinic, the patient reports doing relatively well. She has not had any bleeding complications. She denies chest pain, palpitations, dyspnea, PND, orthopnea, nausea, vomiting, dizziness, syncope, edema, weight gain, or early satiety.  07/2013 - last PCI.   Moved from [place]. Daughter here.   Overall she's been doing well post watchman. No bleeding, no strokelike symptoms. She will occasionally have sensation of palpitations, rapid heartbeat but these are fleeting. Heather May has given her ties him to assist with this 30 mg as needed. She exercises 3 times a week. The tachycardia only lasted a few minutes duration and did not last gray than 15 minutes duration.  She denies any exertional anginal symptoms. We discussed weight loss. See below for further details. . Tachycardia a few min, Heather May . Exer-3 x week.   Past Medical History:  Diagnosis Date  . CAD (coronary artery disease) 08/15/13  . Diabetes mellitus without complication ([place])   . Diverticulosis   . Essential tremor   . GERD (gastroesophageal reflux disease)   . History of long-term use of multiple prescription drugs   . Hyperlipidemia    mixed  . Hypertension   . Long-term (current) use of anticoagulants   . PAD (peripheral artery disease) ([place])   . Percutaneous transluminal coronary angioplasty status   . Permanent atrial fibrillation (HCC)    chads2vasc score is at least 5  . Pulmonary embolism ([place])   . Pulmonary fibrosis ([place])   . Seizure ([place]) 2014   occured in setting of [place]  . Sick sinus syndrome ([place])   . Spinal stenosis   . Subarachnoid hemorrhage ([place])   . Transient ischemic attack    Past Surgical History:  Procedure  Laterality Date  . ABDOMINAL HYSTERECTOMY  2004  . BASAL CELL CARCINOMA EXCISION    . CARDIAC CATHETERIZATION  10/14   PCI   . carpel tunnel surgery Bilateral 2006/ 2010  . CESAREAN SECTION N/A 1978   x 1  . HERNIA REPAIR  06/2010  . LEFT ATRIAL APPENDAGE OCCLUSION N/A 08/26/2015   Procedure: LEFT ATRIAL APPENDAGE OCCLUSION;  Surgeon: Heather Grayer, MD;  Location: [place] [facility];  Service: Cardiovascular;  Laterality: N/A;  . PACEMAKER INSERTION  03/05/15   [name] Accolade DR implanted in [place]  . removal of big toe nail Bilateral   . TEE WITHOUT CARDIOVERSION N/A 08/16/2015   Procedure: TRANSESOPHAGEAL ECHOCARDIOGRAM (TEE);  Surgeon: Heather Casino, MD;  Location[facility];  Service: Cardiovascular;  Laterality: N/A;  . TEE WITHOUT CARDIOVERSION N/A 10/12/2015   Procedure: TRANSESOPHAGEAL ECHOCARDIOGRAM (TEE);  Surgeon: Heather Casino, MD;  Location: [facility] ENDOSCOPY;  Service: Cardiovascular;  Laterality: N/A;  . titanium pins inserted metatarsol bone  02/2012   2 pins placed     Current Outpatient Prescriptions  Medication Sig Dispense Refill  . aspirin 325 MG tablet Take 325 mg by mouth daily.    . benazepril (LOTENSIN) 10 MG tablet Take 10 mg by mouth daily.    . cetirizine (ZYRTEC) 10 MG tablet Take 10 mg by mouth daily as needed.   5  . diltiazem (CARDIZEM) 30 MG tablet Take 1 tablet (30 mg total) by mouth daily as needed. 30 tablet 2  .  glipiZIDE (GLUCOTROL XL) 10 MG 24 hr tablet Take 10 mg by mouth 2 (two) times daily.     . lansoprazole (PREVACID) 15 MG capsule Take 15 mg by mouth daily.     [name] levETIRAcetam (KEPPRA) 500 MG tablet Take 500 mg by mouth 2 (two) times daily.    [name] lovastatin (MEVACOR) 40 MG tablet Take 40 mg by mouth at bedtime.    . metFORMIN (GLUCOPHAGE) 850 MG tablet Take 850 mg by mouth 2 (two) times daily with a meal.    . Multiple Vitamin (MULTIVITAMIN) tablet Take 1 tablet by mouth daily.    . nitroGLYCERIN (NITROSTAT) 0.4 MG SL  tablet Place 0.4 mg under the tongue every 5 (five) minutes as needed for chest pain.    [name] ondansetron (ZOFRAN) 4 MG tablet Take 4 mg by mouth every 8 (eight) hours as needed for nausea or vomiting.    [name] OVER THE COUNTER MEDICATION Take 1 Dose by mouth daily. OTC Joint Juice "1,500 mg of glucosamine plus chondroitin, Vitamin C and Vitamin D"     No current facility-administered medications for this visit.     Allergies:   Sulfa antibiotics   Social History: Social History   Social History  . Marital status: Widowed    Spouse name: N/A  . Number of children: 5  . Years of education: 12   Occupational History  . Retired    Social History Main Topics  . Smoking status: Former Smoker    Quit date: 07/06/2009  . Smokeless tobacco: Never Used     Comment: didnt smoke much  . Alcohol use No     Comment: very infrequently  . Drug use: No  . Sexual activity: Not on file   Other Topics Concern  . Not on file   Social History Narrative   Pt recently moved to [facility] [place].    Here today with her partner.   Previously worked as a Teacher, music.   Caffeine use: only when eating out tea/soda   Right-handed    Family History: Family History  Problem Relation Age of Onset  . Stroke Mother   . Epilepsy Mother   . Stroke Father   . Heart attack Father   . Atrial fibrillation Sister   . Atrial fibrillation Brother   . Diabetes Mellitus II Brother     Review of Systems: All other systems reviewed and are otherwise negative except as noted above.   Physical Exam: VS:  BP 102/60   Pulse 70   Ht 5\' 6"  (1.676 m)   Wt 221 lb (100.2 kg)   BMI 35.67 kg/m  , BMI Body mass index is 35.67 kg/m. Wt Readings from Last 3 Encounters:  01/03/17 221 lb (100.2 kg)  12/18/16 220 lb 6.4 oz (100 kg)  11/29/16 217 lb (98.4 kg)    GEN- The patient is obese appearing, alert and oriented x 3 today.  Uses a specialized walker, Red. HEENT: normocephalic, atraumatic;  sclera clear, conjunctiva pink; hearing intact; oropharynx clear; neck supple  Lungs- Clear to ausculation bilaterally, normal work of breathing.  No wheezes, rales, rhonchi Heart- Irregular rate and rhythm, pacemaker site noted GI- soft, non-tender, non-distended, bowel sounds present  Extremities- no clubbing, cyanosis, bilateral trace edema; DP/PT/radial pulses 1+ bilaterally MS- no significant deformity or atrophy Skin- warm and dry, no rash or lesion  Psych- euthymic mood, full affect Neuro- strength and sensation are intact Overall no significant changes in physical exam  EKG:  01/03/17-atrial fibrillation heart rate 70 bpm, occasional ventricular pacing noted, nonspecific ST-T wave changes, T-wave inversions noted in inferior lateral leads. Personally viewed.   Recent Labs: 06/14/2016: TSH 1.470    Other studies Reviewed: Additional studies/ records that were reviewed today include: hospital records  Assessment and Plan:  Permanent atrial fibrillation Doing well s/p Watchman implant-I described to her the anatomy behind this. TEE demonstrated well seated Watchman device with no leak or jet around device I have reviewed EP team note from 11/29/16 and they stay to continue her aspirin 325 mg once a day.  Heather May gave her diltiazem 30 mg to take as needed for tachycardia palpitations or A. fib with rapid ventricular response. She has not had to utilize this.  She told me that her sister died from atrial fibrillation. I said would be extremely uncommon for mortality to occur as result of a fatal arrhythmia from atrial fibrillation and perhaps she had cardiac arrest from ventricular fibrillation.  Symptomatic bradycardia-pacemaker [facility])  - Doing well, seen in EP clinic. Normal pacemaker function. Note reviewed.  CAD  - No recent ischemic symptoms. No angina/chest pain. She is exercising 3 days a week. Trying to walk with her walker. We discussed the pathophysiology behind Plavix  and stent placement. She was previously concerned that if she stops her Plavix in the future that she may need another stent down the road. Had a prior long discussion about this.  Diabetes with CAD/atrial fibrillation/hypertension -This is been followed by her primary physician as noted above. -She states that her glucose of 303 previously was because she was not fasting. -I expressed the importance of good overall glucose control. Diet modification is important.  Morbid obesity  - Her BMI is greater than 35 with complications of diabetes, hypertension, atrial fibrillation  - Encourage decrease carbohydrates, weight loss, avoidance of excessive calories, exercise.   Current medicines are reviewed at length with the patient today.   The patient does not have concerns regarding her medicines.  The following changes were made today:  Discontinue Warfarin, start Plavix 75mg  daily  Labs/ tests ordered today include: none    Disposition:   Follow up with Dr Heather May in 2 years (she has regular pacemaker monitoring by Dr. Jackalyn Lombard EP team). She knows to contact us if any cardiac symptoms develop.  Signed, Candee Furbish, MD  01/03/2017 10:18 AM   [facility] [address] [place] [address] [phone] (office) [phone] (fax)

## 2017-01-18 ENCOUNTER — Ambulatory Visit (INDEPENDENT_AMBULATORY_CARE_PROVIDER_SITE_OTHER): Payer: Medicare Other | Admitting: *Deleted

## 2017-01-18 DIAGNOSIS — R001 Bradycardia, unspecified: Secondary | ICD-10-CM | POA: Diagnosis not present

## 2017-01-18 LAB — CUP PACEART REMOTE DEVICE CHECK
Battery Remaining Longevity: 90 mo
Battery Remaining Percentage: 100 %
Brady Statistic RA Percent Paced: 0 %
Brady Statistic RV Percent Paced: 65 %
Date Time Interrogation Session: 20180329084100
Implantable Pulse Generator Implant Date: 20160513
Lead Channel Impedance Value: 353 Ohm
Lead Channel Impedance Value: 655 Ohm
Lead Channel Pacing Threshold Amplitude: 1.6 V
Lead Channel Pacing Threshold Pulse Width: 0.4 ms
Lead Channel Setting Pacing Amplitude: 2.3 V
Lead Channel Setting Pacing Pulse Width: 0.4 ms
Lead Channel Setting Sensing Sensitivity: 0.6 mV
Pulse Gen Serial Number: 732886

## 2017-01-18 NOTE — Progress Notes (Signed)
Remote pacemaker transmission.   

## 2017-01-19 ENCOUNTER — Encounter: Payer: Self-pay | Admitting: Cardiology

## 2017-01-31 DIAGNOSIS — I10 Essential (primary) hypertension: Secondary | ICD-10-CM | POA: Diagnosis not present

## 2017-01-31 DIAGNOSIS — Z6835 Body mass index (BMI) 35.0-35.9, adult: Secondary | ICD-10-CM | POA: Diagnosis not present

## 2017-01-31 DIAGNOSIS — E119 Type 2 diabetes mellitus without complications: Secondary | ICD-10-CM | POA: Diagnosis not present

## 2017-01-31 DIAGNOSIS — I639 Cerebral infarction, unspecified: Secondary | ICD-10-CM | POA: Diagnosis not present

## 2017-02-19 ENCOUNTER — Other Ambulatory Visit: Payer: Self-pay | Admitting: Family Medicine

## 2017-02-19 DIAGNOSIS — Z1231 Encounter for screening mammogram for malignant neoplasm of breast: Secondary | ICD-10-CM

## 2017-02-19 DIAGNOSIS — Z6835 Body mass index (BMI) 35.0-35.9, adult: Secondary | ICD-10-CM | POA: Diagnosis not present

## 2017-02-19 DIAGNOSIS — I639 Cerebral infarction, unspecified: Secondary | ICD-10-CM | POA: Diagnosis not present

## 2017-02-19 DIAGNOSIS — I10 Essential (primary) hypertension: Secondary | ICD-10-CM | POA: Diagnosis not present

## 2017-02-19 DIAGNOSIS — Z0000 Encounter for general adult medical examination without abnormal findings: Secondary | ICD-10-CM | POA: Diagnosis not present

## 2017-02-19 DIAGNOSIS — L821 Other seborrheic keratosis: Secondary | ICD-10-CM | POA: Diagnosis not present

## 2017-02-19 DIAGNOSIS — E2839 Other primary ovarian failure: Secondary | ICD-10-CM

## 2017-02-24 ENCOUNTER — Other Ambulatory Visit: Payer: Self-pay | Admitting: Nurse Practitioner

## 2017-03-12 DIAGNOSIS — H2513 Age-related nuclear cataract, bilateral: Secondary | ICD-10-CM | POA: Diagnosis not present

## 2017-03-12 DIAGNOSIS — E119 Type 2 diabetes mellitus without complications: Secondary | ICD-10-CM | POA: Diagnosis not present

## 2017-03-12 DIAGNOSIS — H524 Presbyopia: Secondary | ICD-10-CM | POA: Diagnosis not present

## 2017-03-15 ENCOUNTER — Ambulatory Visit
Admission: RE | Admit: 2017-03-15 | Discharge: 2017-03-15 | Disposition: A | Discharge status: Home / Self Care | Payer: Medicare Other | Source: Ambulatory Visit | Attending: Family Medicine | Admitting: Family Medicine

## 2017-03-15 DIAGNOSIS — Z1231 Encounter for screening mammogram for malignant neoplasm of breast: Secondary | ICD-10-CM

## 2017-03-15 DIAGNOSIS — Z13820 Encounter for screening for osteoporosis: Secondary | ICD-10-CM | POA: Diagnosis not present

## 2017-03-15 DIAGNOSIS — E2839 Other primary ovarian failure: Secondary | ICD-10-CM

## 2017-04-19 ENCOUNTER — Ambulatory Visit (INDEPENDENT_AMBULATORY_CARE_PROVIDER_SITE_OTHER): Payer: Medicare Other | Admitting: *Deleted

## 2017-04-19 DIAGNOSIS — R001 Bradycardia, unspecified: Secondary | ICD-10-CM

## 2017-04-19 NOTE — Progress Notes (Signed)
Remote pacemaker transmission.   

## 2017-04-20 ENCOUNTER — Encounter: Payer: Self-pay | Admitting: Cardiology

## 2017-05-02 DIAGNOSIS — I10 Essential (primary) hypertension: Secondary | ICD-10-CM | POA: Diagnosis not present

## 2017-05-02 DIAGNOSIS — E119 Type 2 diabetes mellitus without complications: Secondary | ICD-10-CM | POA: Diagnosis not present

## 2017-05-02 DIAGNOSIS — Z79899 Other long term (current) drug therapy: Secondary | ICD-10-CM | POA: Diagnosis not present

## 2017-05-02 DIAGNOSIS — E7800 Pure hypercholesterolemia, unspecified: Secondary | ICD-10-CM | POA: Diagnosis not present

## 2017-05-09 DIAGNOSIS — Z6835 Body mass index (BMI) 35.0-35.9, adult: Secondary | ICD-10-CM | POA: Diagnosis not present

## 2017-05-09 DIAGNOSIS — E119 Type 2 diabetes mellitus without complications: Secondary | ICD-10-CM | POA: Diagnosis not present

## 2017-05-09 DIAGNOSIS — I10 Essential (primary) hypertension: Secondary | ICD-10-CM | POA: Diagnosis not present

## 2017-05-09 DIAGNOSIS — E7800 Pure hypercholesterolemia, unspecified: Secondary | ICD-10-CM | POA: Diagnosis not present

## 2017-05-18 LAB — CUP PACEART REMOTE DEVICE CHECK
Battery Remaining Longevity: 84 mo
Battery Remaining Percentage: 100 %
Brady Statistic RA Percent Paced: 0 %
Brady Statistic RV Percent Paced: 66 %
Date Time Interrogation Session: 20180628084100
Implantable Pulse Generator Implant Date: 20160513
Lead Channel Impedance Value: 337 Ohm
Lead Channel Impedance Value: 609 Ohm
Lead Channel Pacing Threshold Amplitude: 1.8 V
Lead Channel Pacing Threshold Pulse Width: 0.4 ms
Lead Channel Setting Pacing Amplitude: 2.4 V
Lead Channel Setting Pacing Pulse Width: 0.4 ms
Lead Channel Setting Sensing Sensitivity: 0.6 mV
Pulse Gen Serial Number: 732886

## 2017-07-19 ENCOUNTER — Ambulatory Visit (INDEPENDENT_AMBULATORY_CARE_PROVIDER_SITE_OTHER): Payer: Medicare Other | Admitting: *Deleted

## 2017-07-19 DIAGNOSIS — R001 Bradycardia, unspecified: Secondary | ICD-10-CM

## 2017-07-19 NOTE — Progress Notes (Signed)
Remote pacemaker transmission.   

## 2017-07-23 DIAGNOSIS — Z23 Encounter for immunization: Secondary | ICD-10-CM | POA: Diagnosis not present

## 2017-07-24 ENCOUNTER — Encounter: Payer: Self-pay | Admitting: Cardiology

## 2017-07-31 LAB — CUP PACEART REMOTE DEVICE CHECK
Battery Remaining Longevity: 84 mo
Battery Remaining Percentage: 100 %
Brady Statistic RA Percent Paced: 0 %
Brady Statistic RV Percent Paced: 67 %
Date Time Interrogation Session: 20180927084300
Implantable Pulse Generator Implant Date: 20160513
Lead Channel Impedance Value: 350 Ohm
Lead Channel Impedance Value: 657 Ohm
Lead Channel Pacing Threshold Amplitude: 1.7 V
Lead Channel Pacing Threshold Pulse Width: 0.4 ms
Lead Channel Setting Pacing Amplitude: 2.4 V
Lead Channel Setting Pacing Pulse Width: 0.4 ms
Lead Channel Setting Sensing Sensitivity: 0.6 mV
Pulse Gen Serial Number: 732886

## 2017-08-29 DIAGNOSIS — Z6834 Body mass index (BMI) 34.0-34.9, adult: Secondary | ICD-10-CM | POA: Diagnosis not present

## 2017-08-29 DIAGNOSIS — I10 Essential (primary) hypertension: Secondary | ICD-10-CM | POA: Diagnosis not present

## 2017-08-29 DIAGNOSIS — E119 Type 2 diabetes mellitus without complications: Secondary | ICD-10-CM | POA: Diagnosis not present

## 2017-10-19 ENCOUNTER — Ambulatory Visit (INDEPENDENT_AMBULATORY_CARE_PROVIDER_SITE_OTHER): Payer: Medicare Other | Admitting: *Deleted

## 2017-10-19 DIAGNOSIS — R001 Bradycardia, unspecified: Secondary | ICD-10-CM

## 2017-10-19 NOTE — Progress Notes (Signed)
Remote pacemaker transmission.   

## 2017-10-22 ENCOUNTER — Encounter: Payer: Self-pay | Admitting: Cardiology

## 2017-11-06 LAB — CUP PACEART REMOTE DEVICE CHECK
Battery Remaining Longevity: 78 mo
Battery Remaining Percentage: 100 %
Brady Statistic RA Percent Paced: 0 %
Brady Statistic RV Percent Paced: 68 %
Date Time Interrogation Session: 20181228094200
Implantable Pulse Generator Implant Date: 20160513
Lead Channel Impedance Value: 340 Ohm
Lead Channel Impedance Value: 635 Ohm
Lead Channel Pacing Threshold Amplitude: 1.9 V
Lead Channel Pacing Threshold Pulse Width: 0.4 ms
Lead Channel Setting Pacing Amplitude: 2.4 V
Lead Channel Setting Pacing Pulse Width: 0.4 ms
Lead Channel Setting Sensing Sensitivity: 0.6 mV
Pulse Gen Serial Number: 732886

## 2017-12-05 DIAGNOSIS — H6123 Impacted cerumen, bilateral: Secondary | ICD-10-CM | POA: Diagnosis not present

## 2017-12-05 DIAGNOSIS — J069 Acute upper respiratory infection, unspecified: Secondary | ICD-10-CM | POA: Diagnosis not present

## 2017-12-05 DIAGNOSIS — I10 Essential (primary) hypertension: Secondary | ICD-10-CM | POA: Diagnosis not present

## 2017-12-05 DIAGNOSIS — E119 Type 2 diabetes mellitus without complications: Secondary | ICD-10-CM | POA: Diagnosis not present

## 2017-12-05 DIAGNOSIS — Z6834 Body mass index (BMI) 34.0-34.9, adult: Secondary | ICD-10-CM | POA: Diagnosis not present

## 2017-12-06 ENCOUNTER — Ambulatory Visit (INDEPENDENT_AMBULATORY_CARE_PROVIDER_SITE_OTHER): Payer: Medicare Other | Admitting: Internal Medicine

## 2017-12-06 ENCOUNTER — Encounter: Payer: Self-pay | Admitting: Internal Medicine

## 2017-12-06 VITALS — BP 114/62 | HR 61 | Ht 66.0 in | Wt 212.0 lb

## 2017-12-06 DIAGNOSIS — R001 Bradycardia, unspecified: Secondary | ICD-10-CM | POA: Diagnosis not present

## 2017-12-06 DIAGNOSIS — Z950 Presence of cardiac pacemaker: Secondary | ICD-10-CM | POA: Diagnosis not present

## 2017-12-06 DIAGNOSIS — I10 Essential (primary) hypertension: Secondary | ICD-10-CM

## 2017-12-06 DIAGNOSIS — I482 Chronic atrial fibrillation: Secondary | ICD-10-CM | POA: Diagnosis not present

## 2017-12-06 DIAGNOSIS — I4821 Permanent atrial fibrillation: Secondary | ICD-10-CM

## 2017-12-06 LAB — CUP PACEART INCLINIC DEVICE CHECK
Date Time Interrogation Session: 20190214050000
Implantable Pulse Generator Implant Date: 20160513
Lead Channel Impedance Value: 349 Ohm
Lead Channel Impedance Value: 634 Ohm
Lead Channel Pacing Threshold Amplitude: 1.4 V
Lead Channel Pacing Threshold Pulse Width: 0.4 ms
Lead Channel Sensing Intrinsic Amplitude: 3.6 mV
Lead Channel Setting Pacing Amplitude: 2.3 V
Lead Channel Setting Pacing Pulse Width: 0.4 ms
Lead Channel Setting Sensing Sensitivity: 0.6 mV
Pulse Gen Serial Number: 732886

## 2017-12-06 NOTE — Patient Instructions (Signed)
Medication Instructions:  Your physician recommends that you continue on your current medications as directed. Please refer to the Current Medication list given to you today.  Labwork: None ordered.  Testing/Procedures: None ordered.  Follow-Up: Your physician wants you to follow-up in: one year with Chanetta Marshall, NP.   You will receive a reminder letter in the mail two months in advance. If you don't receive a letter, please call our office to schedule the follow-up appointment.  Remote monitoring is used to monitor your Pacemaker from home. This monitoring reduces the number of office visits required to check your device to one time per year. It allows [place] to keep an eye on the functioning of your device to ensure it is working properly. You are scheduled for a device check from home on 01/22/2018. You may send your transmission at any time that day. If you have a wireless device, the transmission will be sent automatically. After your physician reviews your transmission, you will receive a postcard with your next transmission date.  Any Other Special Instructions Will Be Listed Below (If Applicable).  Follow up with Dr. Jaynee Eagles.  [phone]  If you need a refill on your cardiac medications before your next appointment, please call your pharmacy.

## 2017-12-06 NOTE — Progress Notes (Signed)
PCP: [name], MD Primary Cardiologist:  Dr Marlou Porch Primary EP:  Dr Rayann Heman  Heather May is a 72 y.o. female who presents today for routine electrophysiology followup.  Since last being seen in our clinic, the patient reports doing reasonably well.  She is frequently tired.  She does not feel well rested in the morning.  She has very little energy.  She feels that her tremor is stable.  Today, she denies symptoms of palpitations, chest pain, shortness of breath,  lower extremity edema, dizziness, presyncope, or syncope.  The patient is otherwise without complaint today.   Past Medical History:  Diagnosis Date  . CAD (coronary artery disease) 08/15/13  . Diabetes mellitus without complication ([place])   . Diverticulosis   . Essential tremor   . GERD (gastroesophageal reflux disease)   . History of long-term use of multiple prescription drugs   . Hyperlipidemia    mixed  . Hypertension   . Long-term (current) use of anticoagulants   . PAD (peripheral artery disease) ([place])   . Percutaneous transluminal coronary angioplasty status   . Permanent atrial fibrillation (HCC)    chads2vasc score is at least 5  . Pulmonary embolism ([place])   . Pulmonary fibrosis ([place])   . Seizure ([place]) 2014   occured in setting of [place]  . Sick sinus syndrome ([place])   . Spinal stenosis   . Subarachnoid hemorrhage ([place])   . Transient ischemic attack    Past Surgical History:  Procedure Laterality Date  . ABDOMINAL HYSTERECTOMY  2004  . BASAL CELL CARCINOMA EXCISION    . CARDIAC CATHETERIZATION  10/14   PCI   . carpel tunnel surgery Bilateral 2006/ 2010  . CESAREAN SECTION N/A 1978   x 1  . HERNIA REPAIR  06/2010  . LEFT ATRIAL APPENDAGE OCCLUSION N/A 08/26/2015   Procedure: LEFT ATRIAL APPENDAGE OCCLUSION;  Surgeon: Thompson Grayer, MD;  Location: [place] [facility];  Service: Cardiovascular;  Laterality: N/A;  . PACEMAKER INSERTION  03/05/15   [name] Accolade DR implanted in [place]  . removal of big toe nail Bilateral   . TEE WITHOUT CARDIOVERSION N/A 08/16/2015   Procedure: TRANSESOPHAGEAL ECHOCARDIOGRAM (TEE);  Surgeon: Pixie Casino, MD;  Location: [facility] ENDOSCOPY;  Service: Cardiovascular;  Laterality: N/A;  . TEE WITHOUT CARDIOVERSION N/A 10/12/2015   Procedure: TRANSESOPHAGEAL ECHOCARDIOGRAM (TEE);  Surgeon: Pixie Casino, MD;  Location: [facility] ENDOSCOPY;  Service: Cardiovascular;  Laterality: N/A;  . titanium pins inserted metatarsol bone  02/2012   2 pins placed     ROS- all systems are reviewed and negative except as per HPI above  Current Outpatient Medications  Medication Sig Dispense Refill  . aspirin 325 MG tablet Take 325 mg by mouth daily.    . benazepril (LOTENSIN) 10 MG tablet Take 10 mg by mouth daily.    . cetirizine (ZYRTEC) 10 MG tablet Take 10 mg by mouth daily as needed.   5  . diltiazem (CARDIZEM) 30 MG tablet TAKE 1 TABLET(30 MG) BY MOUTH DAILY AS NEEDED 30 tablet 8  . glipiZIDE (GLUCOTROL) 5 MG tablet Take 5 mg by mouth 2 (two) times daily.  3  . lansoprazole (PREVACID) 15 MG capsule Take 15 mg by mouth daily.     [name] levETIRAcetam (KEPPRA) 500 MG tablet Take 500 mg by mouth 2 (two) times daily.    [name] lovastatin (MEVACOR) 40 MG tablet Take 40 mg by mouth at bedtime.    [name]  metFORMIN (GLUCOPHAGE) 850 MG tablet Take 850 mg by mouth 2 (two) times daily with a meal.    . Multiple Vitamin (MULTIVITAMIN) tablet Take 1 tablet by mouth daily.    . nitroGLYCERIN (NITROSTAT) 0.4 MG SL tablet Place 0.4 mg under the tongue every 5 (five) minutes as needed for chest pain.    [name] ondansetron (ZOFRAN) 4 MG tablet Take 4 mg by mouth every 8 (eight) hours as needed for nausea or vomiting.    [name] OVER THE COUNTER MEDICATION Take 1 Dose by mouth daily. OTC Joint Juice "1,500 mg of glucosamine plus chondroitin, Vitamin C and Vitamin D"     No current facility-administered medications for this visit.     Physical Exam: Vitals:   12/06/17 1115  BP: 114/62  Pulse:  61  Weight: 212 lb (96.2 kg)  Height: 5\' 6"  (1.676 m)    GEN- The patient is chronically ill appearing, overweight, alert and oriented x 3 today.   Head- normocephalic, atraumatic Eyes-  Sclera clear, conjunctiva pink Ears- hearing intact Oropharynx- clear Lungs- Clear to ausculation bilaterally, normal work of breathing Chest- pacemaker pocket is well healed Heart- irregular rate and rhythm, no murmurs, rubs or gallops, PMI not laterally displaced GI- soft, NT, ND, + BS Extremities- no clubbing, cyanosis, or edema Neuro- appears to have some psychomotor delay, + diffuse resting tremor  Pacemaker interrogation- reviewed in detail today,  See PACEART report  ekg tracing ordered today is personally reviewed and shows afib with intermittent V pacing  Assessment and Plan:  1. Symptomatic bradycardia  Normal pacemaker function See Pace Art report No changes today  2. Permanent atrial fibrillation S/p Watchman 08/2015 for chads2vasc score of 5 and prior ICH On ASA 325mg  daily Rate controlled  3. CAD No ischemic symptoms No changes  4. HTN Stable No change required today  5. Fatigue/ daytime sleepiness I worry about sleep apnea as a possible cause.  She would like to follow-up with Dr Jaynee Eagles for neurologic concerns and also to consider sleep study  [place] Return to see EP NP every year Follow-up with Dr Marlou Porch as scheduled  Thompson Grayer MD, [facility] 12/06/2017 11:30 AM

## 2017-12-10 ENCOUNTER — Other Ambulatory Visit: Payer: Self-pay | Admitting: Family Medicine

## 2017-12-10 ENCOUNTER — Ambulatory Visit
Admission: RE | Admit: 2017-12-10 | Discharge: 2017-12-10 | Disposition: A | Discharge status: Home / Self Care | Payer: Medicare Other | Source: Ambulatory Visit | Attending: Family Medicine | Admitting: Family Medicine

## 2017-12-10 DIAGNOSIS — M179 Osteoarthritis of knee, unspecified: Secondary | ICD-10-CM | POA: Diagnosis not present

## 2017-12-10 DIAGNOSIS — M170 Bilateral primary osteoarthritis of knee: Secondary | ICD-10-CM

## 2017-12-11 ENCOUNTER — Ambulatory Visit (INDEPENDENT_AMBULATORY_CARE_PROVIDER_SITE_OTHER): Payer: Medicare Other | Admitting: Neurology

## 2017-12-11 ENCOUNTER — Encounter: Payer: Self-pay | Admitting: Neurology

## 2017-12-11 VITALS — BP 106/75 | HR 66 | Ht 66.75 in | Wt 214.0 lb

## 2017-12-11 DIAGNOSIS — R0683 Snoring: Secondary | ICD-10-CM

## 2017-12-11 DIAGNOSIS — R29810 Facial weakness: Secondary | ICD-10-CM

## 2017-12-11 DIAGNOSIS — R5382 Chronic fatigue, unspecified: Secondary | ICD-10-CM

## 2017-12-11 DIAGNOSIS — G4719 Other hypersomnia: Secondary | ICD-10-CM | POA: Diagnosis not present

## 2017-12-11 NOTE — Progress Notes (Signed)
[facility]    Provider:  Dr Jaynee Eagles Referring Provider: [name], MD Primary Care Physician:  [name], MD  CC:  Tremors, stroke, seizures, neuropathy   Interval history 12/11/2016: Patient has a new issue today likely sleep apnea, cardiologist stated she should have a sleep eval. 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  Patient has a past medical history of atrial fibrillation, CAD s/p stents,  bradycardia s/p pacemaker, type 2 diabetes, hyperlipidemia, essential tremor, cerebral infarction, hypertension, peripheral vascular disease, spinal stenosis, pain in limb, seizure. ESS 12. She is still on the keppra. Discussed discontinuing. She is excessively tired, no "ambition", she sits in her recliner all day and nods off, tired all the time, snoring.   6 weeks ago she was watching her grandchildren, she had just awakened from sleep, she felt all "floppy" and she sat inher recliner all day, a lot more fatigue, she thought the right side of her mouth was drooping for a few weeks but no one else saw it and nothing else focal she just felt fatigues for a few weeks. 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 CT head to eval for any new strokes.   Interval history 12/18/2016: Heather May is a 72 y.o. female here as a referral from Dr. Tamala Julian for multiple neurologic complaints. Today we will address a new issue of imbalance and falls. 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  Patient has a past medical history of atrial fibrillation, CAD s/p stents,  bradycardia s/p pacemaker, type 2 diabetes, hyperlipidemia, essential tremor, cerebral infarction, hypertension, peripheral vascular disease, spinal stenosis, pain in limb, seizure.She walks, go to the gym, go to movies, socially active. CT of the head She nods  off a lot during the day and naps. She wakes at night frequently. But ESS only 5/10. She has dry mouth in the morning and difficulty swallowing. Occ wakes herself up snoring. Discussed CT head and EEG unremarkable and stopping keppra, cannot be certain she will not have another seizure. Patient will think about it.   HPI:  Heather May is a 72 y.o. female here as a referral from Dr. Tamala Julian for multiple neurologic complaints. Patient has a past medical history of atrial fibrillation, CAD s/p stents,  bradycardia s/p pacemaker, type 2 diabetes, hyperlipidemia, essential tremor, cerebral infarction, hypertension, peripheral vascular disease, spinal stenosis, pain in limb,. She is on aspirin 325 and Keppra. Also on lovastatin. She had a watchman device placed in 08/26/15 by Dr Rayann Heman. She follows with [facility]. She moved from [place] a year and here to establish new care. She had a SAH, she had LOC and neightbor found her laying in the driveway. No one witnessed her fall or a seizure but  there was vomit and blood on the driveway, occurred in 2014 without inciting event or new medications or previous illness. Her partner is here and provides information. She went ot the hospital and had a witnessed seizure as well. For 3 minutes. She has been on keppra since then without any events. Elliot Dally are mostly in the head. She was initially diagnosed with parkinson's disease but later was changed to essential tremor. No resting hand tremor, no hypophonia or dec sense of smell or other symptoms of PD as she went to support groups and did not think she was  like the PD patients. She has a head bob. She has a tremor for years in 2014 slowly worsening not significantly bothering her. Tremor started in 2014 after falling out of her wheelchair and hitting her head. Stable. She does water yoga, silver sneakers once a week, walks the dogs with partner. She uses a walker for stability. No FHx of tremors.   Reviewed notes, labs  and imaging from outside physicians, which showed: Per current pcp notes, Patient has stroke and tremor and wants to get off seizure meds per primary care. Per notes she has unspecified convulsions. She is on Keppra 500 mg twice a day. She is also on aspirin and statin. Patient had a hemorrhagic stroke with subsequent seizure and she has not had an episode since then and would like to know if she should continue medication. She's had a resting tremor for a number of years and was diagnosed with Parkinson's. Stress can make her head shaky. Notes also make reference to benign essential tremor.Per notes from [facility] 2017, she has diabetes that has been uncontrolled in the past, at one point she could not afford a glucometer, no eye exam in 12 years, A1c high, recently started on Metformin, continued on glipizide.  Last hgba1c 8.2 10/2015,   Sodium 140 135 - 146 MMOL/L  Potassium 4.4 3.5 - 5.3 MMOL/L  Chloride 102 98 - 110 MMOL/L  CO2 29 21 - 33 MMOL/L  BUN 17 8 - 24 MG/DL  Glucose 226 (H) 70 - 99 MG/DL  Creatinine 0.77 0.50 - 1.50 MG/DL  Calcium 9.8 8.5 - 10.5 MG/DL  Total Protein 7.5 6.0 - 8.3 G/DL  Albumin 4.3 3.5 - 5.0 G/DL  Total Bilirubin 0.6 0.1 - 1.2 MG/DL  Alkaline Phosphatase 120 25 - 125 IU/L  AST (SGOT) 36 5 - 40 IU/L  ALT (SGPT) 16 5 - 50 IU/L  Anion Gap 9 4 - 14  Est. GFR Non-Black >60  Est. GFR Black >60    CT of the head in 2007 [facility] per report: Findings:  There is no evidence for mass effect, midline shift, or extra-axial fluid collections.There is no evidence for space-occupying lesion or intracranial hemorrhage. There is no evidence for acute cortical-based area of infarction.Ventricles and sulci are appropriate for the patient's age. The basal cisterns are patent. No fractures. There may be minimal soft tissue swelling over the left supraorbital region.  Visualized portions of the orbits are unremarkable. The visualized paranasal sinuses and mastoids are  unremarkable.  Impression: 1. No acute intracranial process.  Review of Systems: Patient complains of symptoms per HPI as well as the following symptoms: daytime sleepiness, joint pain, ringing in the ears. Pertinent negatives per HPI. All others negative.  Social History   Socioeconomic History  . Marital status: Widowed    Spouse name: Not on file  . Number of children: 5  . Years of education: 83  . Highest education level: Not on file  Social Needs  . Financial resource strain: Not on file  . Food insecurity - worry: Not on file  . Food insecurity - inability: Not on file  . Transportation needs - medical: Not on file  . Transportation needs - non-medical: Not on file  Occupational History  . Occupation: Retired  Tobacco Use  . Smoking status: Former Smoker    Last attempt to quit: 07/06/2009    Years since quitting: 8.4  . Smokeless tobacco: Never Used  . Tobacco comment: didnt smoke much  Substance  and Sexual Activity  . Alcohol use: No    Alcohol/week: 0.0 oz    Comment: very infrequently  . Drug use: No  . Sexual activity: Not on file  Other Topics Concern  . Not on file  Social History Narrative   Pt recently moved [place] [facility] [place].    Previously worked as a Teacher, music.   Caffeine use: only when eating out tea/soda   Right-handed   Lives at home with partner/roommate    Family History  Problem Relation Age of Onset  . Stroke Mother   . Epilepsy Mother   . Stroke Father   . Heart attack Father   . Atrial fibrillation Sister   . Atrial fibrillation Brother   . Diabetes Mellitus II Brother   . Pulmonary embolism Son     Past Medical History:  Diagnosis Date  . CAD (coronary artery disease) 08/15/13  . Diabetes mellitus without complication ([place])   . Diverticulosis   . Essential tremor   . GERD (gastroesophageal reflux disease)   . History of long-term use of multiple prescription drugs   . Hyperlipidemia    mixed  .  Hypertension   . Long-term (current) use of anticoagulants   . PAD (peripheral artery disease) ([place])   . Percutaneous transluminal coronary angioplasty status   . Permanent atrial fibrillation ([facility])    chads2vasc score is at least 5  . Pulmonary embolism ([place])   . Pulmonary fibrosis ([place])   . Seizure ([place]) 2014   occured in setting of [place]  . Sick sinus syndrome ([place])   . Spinal stenosis   . Subarachnoid hemorrhage ([place])   . Transient ischemic attack     Past Surgical History:  Procedure Laterality Date  . ABDOMINAL HYSTERECTOMY  2004  . BASAL CELL CARCINOMA EXCISION    . CARDIAC CATHETERIZATION  10/14   PCI   . carpel tunnel surgery Bilateral 2006/ 2010  . CESAREAN SECTION N/A 1978   x 1  . HERNIA REPAIR  06/2010  . LEFT ATRIAL APPENDAGE OCCLUSION N/A 08/26/2015   Procedure: LEFT ATRIAL APPENDAGE OCCLUSION;  Surgeon: Thompson Grayer, MD;  Location: [facility];  Service: Cardiovascular;  Laterality: N/A;  . PACEMAKER INSERTION  03/05/15   [name] Accolade DR implanted in [place]  . removal of big toe nail Bilateral   . TEE WITHOUT CARDIOVERSION N/A 08/16/2015   Procedure: TRANSESOPHAGEAL ECHOCARDIOGRAM (TEE);  Surgeon: Pixie Casino, MD;  Location: [facility] ENDOSCOPY;  Service: Cardiovascular;  Laterality: N/A;  . TEE WITHOUT CARDIOVERSION N/A 10/12/2015   Procedure: TRANSESOPHAGEAL ECHOCARDIOGRAM (TEE);  Surgeon: Pixie Casino, MD;  Location: [facility] ENDOSCOPY;  Service: Cardiovascular;  Laterality: N/A;  . titanium pins inserted metatarsol bone  02/2012   2 pins placed     Current Outpatient Medications  Medication Sig Dispense Refill  . aspirin 325 MG tablet Take 325 mg by mouth daily.    . benazepril (LOTENSIN) 10 MG tablet Take 10 mg by mouth daily.    [name] glipiZIDE (GLUCOTROL) 5 MG tablet Take 5 mg by mouth 2 (two) times daily.  3  . levETIRAcetam (KEPPRA) 500 MG tablet Take 500 mg by mouth 2 (two) times daily.    [name] lovastatin (MEVACOR) 40 MG tablet Take  40 mg by mouth at bedtime.    . metFORMIN (GLUCOPHAGE) 850 MG tablet Take 850 mg by mouth 2 (two) times daily with a meal.    . Multiple Vitamin (MULTIVITAMIN) tablet  Take 1 tablet by mouth daily.    [name] OVER THE COUNTER MEDICATION Take 1 Dose by mouth daily. OTC Joint Juice "1,500 mg of glucosamine plus chondroitin, Vitamin C and Vitamin D"    . RaNITidine HCl (ZANTAC PO) Take by mouth.    . cetirizine (ZYRTEC) 10 MG tablet Take 10 mg by mouth daily as needed.   5  . diltiazem (CARDIZEM) 30 MG tablet TAKE 1 TABLET(30 MG) BY MOUTH DAILY AS NEEDED (Patient not taking: Reported on 12/11/2017) 30 tablet 8  . nitroGLYCERIN (NITROSTAT) 0.4 MG SL tablet Place 0.4 mg under the tongue every 5 (five) minutes as needed for chest pain.    [name] ondansetron (ZOFRAN) 4 MG tablet Take 4 mg by mouth every 8 (eight) hours as needed for nausea or vomiting.     No current facility-administered medications for this visit.     Allergies as of 12/11/2017 - Review Complete 12/11/2017  Allergen Reaction Noted  . Hydrocodone Nausea And Vomiting 12/11/2017  . Sulfa antibiotics  07/07/2015    Vitals: BP 106/75 (BP Location: Left Arm, Patient Position: Sitting)   Pulse 66   Ht 5' 6.75" (1.695 m)   Wt 214 lb (97.1 kg)   BMI 33.77 kg/m  Last Weight:  Wt Readings from Last 1 Encounters:  12/11/17 214 lb (97.1 kg)   Last Height:   Ht Readings from Last 1 Encounters:  12/11/17 5' 6.75" (1.695 m)     Physical exam: Exam: Gen: NAD, conversant, well nourised, obese, well groomed                     CV: RRR, no MRG. No Carotid Bruits. No peripheral edema, warm, nontender Eyes: Conjunctivae clear without exudates or hemorrhage  Neuro: Detailed Neurologic Exam  Speech:    Speech is normal; fluent and spontaneous with normal comprehension.  Cognition:    The patient is oriented to person, place, and time;     recent and remote memory intact;     language fluent;     normal attention, concentration,     fund  of knowledge Cranial Nerves:    The pupils are equal, round, and reactive to light.Attempted but could not visualize fundi due to small pupils.  Visual fields are full to finger confrontation. Extraocular movements are intact. Trigeminal sensation is intact and the muscles of mastication are normal. Right lid assymmetry(chronic for 50 years and stable) right ptosis and right NL flattening. The palate elevates in the midline. Hearing intact. Voice is tremulous as well. Shoulder shrug is normal. The tongue has normal motion without fasciculations.   Coordination:    No dysmetria  Gait: difficulty getting out of seat. Stooped due to stenosis, antalgic due to knee and back pain.   Motor Observation: Head bobbing, bilateral leg tremor, no significant postural or resting hand tremor some with intention on FTN     Tone:    Normal muscle tone.  No cogwheeling.   Posture:    Posture is stooped    Strength:    Strength is V/V in the upper and lower limbs.      Sensation: decreased pp and temp distally in the feet, few seconds vibrtion  intact prroprioception.      Reflex Exam:  DTR's: upper right 2+ and 1+ upper left, hypo patellatrs absent AJs.      Toes:    The toes are equivocal bilaterally.   Clonus:    Clonus is absent.  Assessment/Plan:  72 y.o. female here as a referral from Dr. Tamala Julian for multiple neurologic complaints. Patient has a past medical history of atrial fibrillation, CAD s/p stents,  bradycardia s/p pacemaker, type 2 diabetes, hyperlipidemia, essential tremor, cerebral infarction, hypertension, peripheral vascular disease, spinal stenosis, pain in limb,  Had a long discussion about seizures. Seizure happened due to a reported [place] in 2014. She has been free of any events since then on Keppra. She would like to get off of keppra. CT of the head and EEG were unremarkable.  is likely safe to go off of the keppra however cannot ensure she will not have another seizure  which can be dangerous and cause morbidity of mortality.Discussed at length with partner as well: CT of the head was normal for age, EEG normal. Will stop Keppra, take one pill a day for 2 weeks then stop, discussed cannot rule out another seizure in the future.   Sleep eval: Epworth sleepiness scale was 12, cardiology requests sleep eval,  She is excessively tired, no "ambition", she sits in her recliner all day and nods off, tired all the time, snoring, she has dry mouth in the morning  She appears to have essential tremor, she is not bothered by it, do not recommend any treatment at this time, monitor clinically. Tremor is stable, still not bothering her.   She has peripheral neuropathy from diabetes, not significantly causing her pain, stable  Follow with cardiology for afib. She understands she has an increased risk of stroke with afib. Continue asa 325. She has a Multimedia programmer.   Episode of fatigue, wondering if she had a TIA or stroke. Will order a CT head.   Follow closely with pcp for management of vascular risk factors.  maintain strict control of hypertension with blood pressure goal below 130/90, diabetes with hemoglobin A1c goal below 6.5% and lipids with LDL cholesterol goal below 70 mg/dL. . I also advised the patient to eat a healthy diet with plenty of whole grains, cereals, fruits and vegetables, exercise regularly and maintain ideal body weight .  Orders Placed This Encounter  Procedures  . CT HEAD WO CONTRAST  . Ambulatory referral to Sleep Studies     Cc: Dr. Elonda Husky, MD  [facility] [address] [place], [place] [address]  Phone [phone] Fax [phone]  A total of 25 minutes was spent face-to-face with this patient. Over half this time was spent on counseling patient on the fatigue, seizure, snoring diagnosis and different diagnostic and therapeutic options available.

## 2017-12-11 NOTE — Patient Instructions (Signed)
Sleep evaluation (we will call to schedule appointment) CT of the head Keppra 500mg  daily(1 tab) for two weeks then stop Continue current medications

## 2017-12-22 ENCOUNTER — Ambulatory Visit
Admission: RE | Admit: 2017-12-22 | Discharge: 2017-12-22 | Disposition: A | Discharge status: Home / Self Care | Payer: Medicare Other | Source: Ambulatory Visit | Attending: Neurology | Admitting: Neurology

## 2017-12-22 DIAGNOSIS — R29810 Facial weakness: Secondary | ICD-10-CM

## 2017-12-24 ENCOUNTER — Telehealth: Payer: Self-pay | Admitting: Neurology

## 2017-12-24 NOTE — Telephone Encounter (Signed)
Heather May, there appears to be a new small stroke on her head CT. I can't tell when it happened but it is new since the CT scan in 2017. I would like to change her aspirin to Plavix. I would also like her to come into the office so I can discuss it with her and show her the pictures. It looks like it was a little blood vessel that closed up likely due to atherosclerosis (she has HTN, Diabetes, possibly sleep apnea, high cholesterol) and these are all risk factors for this kind of stroke. It does not appear to be embolic from afib in the location where the stroke is it but cant rule that out.   This is too much to discuss on the phone I really need to see her in the office, can't discuss over the phone. I can make time for her this or next week anytime she likes. thanks

## 2017-12-25 NOTE — Telephone Encounter (Signed)
Called patient and discussed pt coming into office to review CT results and plan of care. Scheduled pt for this Thurs 3/7 @ 10:30 arrival time 10:00. She verbalized understanding and appreciation.

## 2017-12-26 DIAGNOSIS — Z6834 Body mass index (BMI) 34.0-34.9, adult: Secondary | ICD-10-CM | POA: Diagnosis not present

## 2017-12-26 DIAGNOSIS — M170 Bilateral primary osteoarthritis of knee: Secondary | ICD-10-CM | POA: Diagnosis not present

## 2017-12-27 ENCOUNTER — Ambulatory Visit (INDEPENDENT_AMBULATORY_CARE_PROVIDER_SITE_OTHER): Payer: Medicare Other | Admitting: Neurology

## 2017-12-27 ENCOUNTER — Encounter: Payer: Self-pay | Admitting: Neurology

## 2017-12-27 VITALS — BP 135/71 | HR 61

## 2017-12-27 DIAGNOSIS — I6381 Other cerebral infarction due to occlusion or stenosis of small artery: Secondary | ICD-10-CM

## 2017-12-27 MED ORDER — CLOPIDOGREL BISULFATE 75 MG PO TABS: 75.0000 mg | ORAL_TABLET | Freq: Every day | ORAL | 11 refills | Status: DC

## 2017-12-27 NOTE — Progress Notes (Signed)
[facility]    Provider:  Dr Jaynee Eagles Referring Provider: [name], MD Primary Care Physician:  [name], MD  CC:  Tremors, stroke, seizures, neuropathy  - New stroke on CT scan  Interval history 12/27/2017: Patient is her for a new problem, new stroke found on CT head.  Appears to be small-vessel disease due to location and size - however cannot rule out afib. Will send notes to Dr. Joylene Grapes and would not rule out starting Eliquis in the future. Had a long discussion with patient and her partner, reviewed images and answered questions.  CT 12/22/2017: Abnormal CT scan of the head showing remote age right internal capsule infarct and mild age-appropriate changes of chronic microvascular ischemia and generalized cerebral atrophy.  Compared to the CT head dated 06/30/2016 the right internal capsule infarct appears to be new  Interval history 12/11/2016: Patient has a new issue today likely sleep apnea, cardiologist stated she should have a sleep eval. 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  Patient has a past medical history of atrial fibrillation, CAD s/p stents,  bradycardia s/p pacemaker, type 2 diabetes, hyperlipidemia, essential tremor, cerebral infarction, hypertension, peripheral vascular disease, spinal stenosis, pain in limb, seizure. ESS 12. She is still on the keppra. Discussed discontinuing. She is excessively tired, no "ambition", she sits in her recliner all day and nods off, tired all the time, snoring.   6 weeks ago she was watching her grandchildren, she had just awakened from sleep, she felt all "floppy" and she sat inher recliner all day, a lot more fatigue, she thought the right side of her mouth was drooping for a few weeks but no one else saw it and nothing else focal she just felt fatigues for a few weeks. 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 CT head to eval for any new strokes.   Interval history 12/18/2016: Tymeshia Awan is a 72 y.o. female here as a referral from Dr. Tamala Julian for multiple neurologic complaints. Today we will address a new issue of imbalance and falls. 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  Patient has a past medical history of atrial fibrillation, CAD s/p stents,  bradycardia s/p pacemaker, type 2 diabetes, hyperlipidemia, essential tremor, cerebral infarction, hypertension, peripheral vascular disease, spinal stenosis, pain in limb, seizure.She walks, go to the gym, go to movies, socially active. CT of the head She nods off a lot during the day and naps. She wakes at night frequently. But ESS only 5/10. She has dry mouth in the morning and difficulty swallowing. Occ wakes herself up snoring. Discussed CT head and EEG unremarkable and stopping keppra, cannot be certain she will not have another seizure. Patient will think about it.   HPI:  Mahayla Haddaway is a 72 y.o. female here as a referral from Dr. Tamala Julian for multiple neurologic complaints. Patient has a past medical history of atrial fibrillation, CAD s/p stents,  bradycardia s/p pacemaker, type 2 diabetes, hyperlipidemia, essential tremor, cerebral infarction, hypertension, peripheral vascular disease, spinal stenosis, pain in limb,. She is on aspirin 325 and Keppra. Also on lovastatin. She had a watchman device placed in 08/26/15 by Dr Rayann Heman. She follows with  heart care. She moved from [place] a year and here to establish new care. She had a SAH, she had LOC and neightbor found  her laying in the driveway. No one witnessed her fall or a seizure but  there was vomit and blood on the driveway, occurred in 2014 without inciting event or new medications or previous illness. Her partner is here and provides information. She went ot the hospital and had a witnessed seizure as well. For 3 minutes. She has been on keppra  since then without any events. Elliot Dally are mostly in the head. She was initially diagnosed with parkinson's disease but later was changed to essential tremor. No resting hand tremor, no hypophonia or dec sense of smell or other symptoms of PD as she went to support groups and did not think she was like the PD patients. She has a head bob. She has a tremor for years in 2014 slowly worsening not significantly bothering her. Tremor started in 2014 after falling out of her wheelchair and hitting her head. Stable. She does water yoga, silver sneakers once a week, walks the dogs with partner. She uses a walker for stability. No FHx of tremors.   Reviewed notes, labs and imaging from outside physicians, which showed: Per current pcp notes, Patient has stroke and tremor and wants to get off seizure meds per primary care. Per notes she has unspecified convulsions. She is on Keppra 500 mg twice a day. She is also on aspirin and statin. Patient had a hemorrhagic stroke with subsequent seizure and she has not had an episode since then and would like to know if she should continue medication. She's had a resting tremor for a number of years and was diagnosed with Parkinson's. Stress can make her head shaky. Notes also make reference to benign essential tremor.Per notes from [facility] 2017, she has diabetes that has been uncontrolled in the past, at one point she could not afford a glucometer, no eye exam in 12 years, A1c high, recently started on Metformin, continued on glipizide.  Last hgba1c 8.2 10/2015,   Sodium 140 135 - 146 MMOL/L  Potassium 4.4 3.5 - 5.3 MMOL/L  Chloride 102 98 - 110 MMOL/L  CO2 29 21 - 33 MMOL/L  BUN 17 8 - 24 MG/DL  Glucose 226 (H) 70 - 99 MG/DL  Creatinine 0.77 0.50 - 1.50 MG/DL  Calcium 9.8 8.5 - 10.5 MG/DL  Total Protein 7.5 6.0 - 8.3 G/DL  Albumin 4.3 3.5 - 5.0 G/DL  Total Bilirubin 0.6 0.1 - 1.2 MG/DL  Alkaline Phosphatase 120 25 - 125 IU/L  AST (SGOT) 36 5 - 40 IU/L  ALT  (SGPT) 16 5 - 50 IU/L  Anion Gap 9 4 - 14  Est. GFR Non-Black >60  Est. GFR Black >60    CT of the head in 2007 at [facility] per report: Findings:  There is no evidence for mass effect, midline shift, or extra-axial fluid collections.There is no evidence for space-occupying lesion or intracranial hemorrhage. There is no evidence for acute cortical-based area of infarction.Ventricles and sulci are appropriate for the patient's age. The basal cisterns are patent. No fractures. There may be minimal soft tissue swelling over the left supraorbital region.  Visualized portions of the orbits are unremarkable. The visualized paranasal sinuses and mastoids are unremarkable.  Impression: 1. No acute intracranial process.  Review of Systems: Patient complains of symptoms per HPI as well as the following symptoms: daytime sleepiness, joint pain, ringing in the ears. Pertinent negatives per HPI. All others negative.  Social History   Socioeconomic History  . Marital status: Widowed    Spouse name: Not  on file  . Number of children: 5  . Years of education: 37  . Highest education level: Not on file  Social Needs  . Financial resource strain: Not on file  . Food insecurity - worry: Not on file  . Food insecurity - inability: Not on file  . Transportation needs - medical: Not on file  . Transportation needs - non-medical: Not on file  Occupational History  . Occupation: Retired  Tobacco Use  . Smoking status: Former Smoker    Last attempt to quit: 07/06/2009    Years since quitting: 8.4  . Smokeless tobacco: Never Used  . Tobacco comment: didnt smoke much  Substance and Sexual Activity  . Alcohol use: No    Alcohol/week: 0.0 oz    Comment: very infrequently  . Drug use: No  . Sexual activity: Not on file  Other Topics Concern  . Not on file  Social History Narrative   Pt recently moved to [facility] [place].    Previously worked as a Teacher, music.   Caffeine  use: only when eating out tea/soda   Right-handed   Lives at home with partner/roommate    Family History  Problem Relation Age of Onset  . Stroke Mother   . Epilepsy Mother   . Stroke Father   . Heart attack Father   . Atrial fibrillation Sister   . Atrial fibrillation Brother   . Diabetes Mellitus II Brother   . Pulmonary embolism Son     Past Medical History:  Diagnosis Date  . CAD (coronary artery disease) 08/15/13  . Diabetes mellitus without complication ([place])   . Diverticulosis   . Essential tremor   . GERD (gastroesophageal reflux disease)   . History of long-term use of multiple prescription drugs   . Hyperlipidemia    mixed  . Hypertension   . Long-term (current) use of anticoagulants   . PAD (peripheral artery disease) ([place])   . Percutaneous transluminal coronary angioplasty status   . Permanent atrial fibrillation ([facility])    chads2vasc score is at least 5  . Pulmonary embolism ([place])   . Pulmonary fibrosis ([place])   . Seizure ([place]) 2014   occured in setting of [place]  . Sick sinus syndrome ([place])   . Spinal stenosis   . Subarachnoid hemorrhage ([place])   . Transient ischemic attack     Past Surgical History:  Procedure Laterality Date  . ABDOMINAL HYSTERECTOMY  2004  . BASAL CELL CARCINOMA EXCISION    . CARDIAC CATHETERIZATION  10/14   PCI   . carpel tunnel surgery Bilateral 2006/ 2010  . CESAREAN SECTION N/A 1978   x 1  . HERNIA REPAIR  06/2010  . LEFT ATRIAL APPENDAGE OCCLUSION N/A 08/26/2015   Procedure: LEFT ATRIAL APPENDAGE OCCLUSION;  Surgeon: Thompson Grayer, MD;  Location: [facility];  Service: Cardiovascular;  Laterality: N/A;  . PACEMAKER INSERTION  03/05/15   [name] Accolade DR implanted in [place]  . removal of big toe nail Bilateral   . TEE WITHOUT CARDIOVERSION N/A 08/16/2015   Procedure: TRANSESOPHAGEAL ECHOCARDIOGRAM (TEE);  Surgeon: Pixie Casino, MD;  Location: [facility];  Service: Cardiovascular;  Laterality:  N/A;  . TEE WITHOUT CARDIOVERSION N/A 10/12/2015   Procedure: TRANSESOPHAGEAL ECHOCARDIOGRAM (TEE);  Surgeon: Pixie Casino, MD;  Location[facility] END[facility]COPY;  Service: Cardiovascular;  Laterality: N/A;  . titanium pins inserted metatarsol bone  02/2012   2 pins placed     Current  Outpatient Medications  Medication Sig Dispense Refill  . benazepril (LOTENSIN) 10 MG tablet Take 10 mg by mouth daily.    . cetirizine (ZYRTEC) 10 MG tablet Take 10 mg by mouth daily as needed.   5  . diltiazem (CARDIZEM) 30 MG tablet TAKE 1 TABLET(30 MG) BY MOUTH DAILY AS NEEDED 30 tablet 8  . glipiZIDE (GLUCOTROL) 5 MG tablet Take 5 mg by mouth 2 (two) times daily.  3  . levETIRAcetam (KEPPRA) 500 MG tablet Take 500 mg by mouth daily.     [name] lovastatin (MEVACOR) 40 MG tablet Take 40 mg by mouth at bedtime.    . metFORMIN (GLUCOPHAGE) 850 MG tablet Take 850 mg by mouth 2 (two) times daily with a meal.    . Multiple Vitamin (MULTIVITAMIN) tablet Take 1 tablet by mouth daily.    . nitroGLYCERIN (NITROSTAT) 0.4 MG SL tablet Place 0.4 mg under the tongue every 5 (five) minutes as needed for chest pain.    [name] ondansetron (ZOFRAN) 4 MG tablet Take 4 mg by mouth every 8 (eight) hours as needed for nausea or vomiting.    [name] OVER THE COUNTER MEDICATION Take 1 Dose by mouth daily. OTC Joint Juice "1,500 mg of glucosamine plus chondroitin, Vitamin C and Vitamin D"    . RaNITidine HCl (ZANTAC PO) Take by mouth.    . clopidogrel (PLAVIX) 75 MG tablet Take 1 tablet (75 mg total) by mouth daily. 30 tablet 11   No current facility-administered medications for this visit.     Allergies as of 12/27/2017 - Review Complete 12/27/2017  Allergen Reaction Noted  . Hydrocodone Nausea And Vomiting 12/11/2017  . Sulfa antibiotics  07/07/2015    Vitals: BP 135/71 (BP Location: Right Arm, Patient Position: Sitting)   Pulse 61  Last Weight:  Wt Readings from Last 1 Encounters:  12/11/17 214 lb (97.1 kg)   Last Height:   Ht Readings  from Last 1 Encounters:  12/11/17 5' 6.75" (1.695 m)     Physical exam: Exam: Gen: NAD, conversant, well nourised, obese, well groomed                     CV: RRR, no MRG. No Carotid Bruits. No peripheral edema, warm, nontender Eyes: Conjunctivae clear without exudates or hemorrhage  Neuro: Detailed Neurologic Exam  Speech:    Speech is normal; fluent and spontaneous with normal comprehension.  Cognition:    The patient is oriented to person, place, and time;     recent and remote memory intact;     language fluent;     normal attention, concentration,     fund of knowledge Cranial Nerves:    The pupils are equal, round, and reactive to light.Attempted but could not visualize fundi due to small pupils.  Visual fields are full to finger confrontation. Extraocular movements are intact. Trigeminal sensation is intact and the muscles of mastication are normal. Right lid assymmetry(chronic for 50 years and stable) right ptosis and right NL flattening. The palate elevates in the midline. Hearing intact. Voice is tremulous as well. Shoulder shrug is normal. The tongue has normal motion without fasciculations.   Coordination:    No dysmetria  Gait: difficulty getting out of seat. Stooped due to stenosis, antalgic due to knee and back pain.   Motor Observation: Head bobbing, bilateral leg tremor, no significant postural or resting hand tremor some with intention on FTN     Tone:    Normal muscle tone.  No cogwheeling.   Posture:    Posture is stooped    Strength:    Strength is V/V in the upper and lower limbs.      Sensation: decreased pp and temp distally in the feet, few seconds vibrtion  intact prroprioception.      Reflex Exam:  DTR's: upper right 2+ and 1+ upper left, hypo patellatrs absent AJs.      Toes:    The toes are equivocal bilaterally.   Clonus:    Clonus is absent.     Assessment/Plan:  72 y.o. female here as a referral from Dr. Tamala Julian for multiple  neurologic complaints. Patient has a complicated past medical history of atrial fibrillation, watchman device, hemorrhage while on coumadin, CAD s/p stents,  bradycardia s/p pacemaker, type 2 diabetes, hyperlipidemia, essential tremor, cerebral infarction, hypertension, peripheral vascular disease, spinal stenosis, pain in limb.  - new stroke found on CT head.  Appears to be small-vessel disease due to location and size - however cannot rule out afib. Will send notes to Dr. Joylene Grapes and would not rule out starting Eliquis in the future. Change Aspirin 325mg  to Plavix. I had a long d/w patient about her recent stroke, risk for recurrent stroke/TIAs, personally independently reviewed imaging studies and stroke evaluation results and answered questions. Start Plavix for secondary stroke prevention and maintain strict control of hypertension with blood pressure goal below 130/90, diabetes with hemoglobin A1c goal below 6.5% and lipids with LDL cholesterol goal below 70 mg/dL I also advised the patient to eat a healthy diet with plenty of whole grains, cereals, fruits and vegetables, exercise regularly and maintain ideal body weight .  Had a long discussion about seizures. Seizure happened due to a reported [place] in 2014. She has been free of any events since then on Keppra. She would like to get off of keppra. CT of the head and EEG were unremarkable.  is likely safe to go off of the keppra however cannot ensure she will not have another seizure which can be dangerous and cause morbidity of mortality.Discussed at length with partner and patient at last appointment: CT of the head was normal for age, EEG normal. Decided to titrate off of  Keppra, take one pill a day for 2 weeks then stop, discussed cannot rule out another seizure in the future.   Sleep eval: Epworth sleepiness scale was 12, cardiology requests sleep eval,  She is excessively tired, no "ambition", she sits in her recliner all day and nods off, tired  all the time, snoring, she has dry mouth in the morning. Ordered and pending sleep eval.  She appears to have essential tremor, she is not bothered by it, do not recommend any treatment at this time, monitor clinically. Tremor is stable, still not bothering her.   She has peripheral neuropathy from diabetes, not significantly causing her pain, stable  Follow with cardiology for afib. She understands she has an increased risk of stroke with afib.[name] She has a Multimedia programmer.   Cc: Dr. Ernie Hew, Dr. Milus Glazier, MD  [facility] [address] [place], [place] [address]  Phone [phone] Fax [phone]  A total of 40 minutes was spent face-to-face with this patient. Over half this time was spent on counseling patient on the stroke diagnosis and different diagnostic and therapeutic options available.

## 2017-12-27 NOTE — Patient Instructions (Addendum)
Stop Aspirin. Start Plavix daily.  Recommend HGBA1c < 7 LDL goal < 70  Lacunar Stroke A stroke is the sudden death of brain tissue that occurs when an area of the brain does not get enough oxygen. A lacunar stroke (lacunar infarction) is caused by a blockage in one of the small arteries deep in the brain. Lacunar stroke is a medical emergency that must be treated right away.It is important to get help right away as soon as you notice symptoms of stroke. What are the causes? A lacunar stroke occurs when small arteries deep in the brain become more narrow due to a buildup of fatty deposits in the arteries (atherosclerosis). When the arteries narrow, less blood flows to certain areas of the brain. Without enough blood and oxygen, these parts of the brain can die or become permanently damaged. What increases the risk? You are more likely to develop this condition if:  You have high blood pressure (hypertension).  You have high cholesterol (hyperlipidemia).  You smoke.  You have diabetes.  You are obese.  You have an unhealthy diet. This includes foods that are high in saturated fat, trans fat, and salt (sodium).  You have a heart rhythm disorder (atrial fibrillation).  You have heart disease.  You have artery disease, such as carotid artery disease or peripheral artery disease.  You have sickle cell disease.  You are age 59 or older.  You have a personal or family history of stroke.  You are African-American.  You are a woman who: ? Is pregnant. ? Has a history of diabetes during pregnancy (gestational diabetes). ? Has a history of preeclampsia or eclampsia. ? Has had hormone therapy after menopause. ? Uses oral birth control (contraception), especially while smoking.  What are the signs or symptoms? Symptoms of this condition usually develop suddenly. They may include:  Weakness or numbness in your face, arm, or leg, especially on one side of your body.  Trouble walking  or difficulty moving your arms or legs.  Loss of balance or coordination.  Confusion.  Slurred speech (dysarthria).  Trouble speaking, understanding speech, or both (aphasia).  Vision problems in one or both eyes.  Dizziness.  Nausea and vomiting.  Severe headache with no known cause.  How is this diagnosed? This condition may be diagnosed based on:  Your symptoms and medical history.  A physical exam.  Blood tests.  A CT scan of the brain.  MRI.  Ultrasound of an artery. This may help find blood flow problems or blockages.  Angiogram. During this test, dye is injected into your blood and then an X-ray is done to look for blockages. The dye helps blood flow and blockages show up clearly on X-rays.  Electroencephalogram (EEG). This test checks electrical activity in the brain.  How is this treated? This condition must be treated within 4.5 hours of the start of the stroke. It is treated with IV medicine that dissolves the blood clot (tissue plasminogen activator, TPA) that is causing the blockage. The goal is to restore blood flow to the brain as soon as possible. Your health care provider may prescribe blood thinners (antiplatelets or anticoagulants) to lower your risk of another stroke. Follow these instructions at home: Medicines  Take over-the-counter and other prescription medicines only as told by your health care provider. This includes diabetes or cholesterol medicine.  If you were told to take a medicine to thin your blood, such as aspirin, take it exactly as told by your health care  provider. ? Taking too much blood-thinning medicine can cause bleeding. ? If you do not take enough blood-thinning medicine, you will not have the protection that you need against another stroke and other problems. Eating and drinking  Follow instructions from your health care provider about eating and drinking restrictions.  Eat a healthy diet. This includes plenty of fruits  and vegetables, lean meats, whole grains, and low-fat dairy products.  Avoid foods high in saturated fat, trans fat, or sodium.  Limit alcohol intake to no more than 1 drink a day for nonpregnant women and 2 drinks a day for men. One drink equals 12 oz of beer, 5 oz of wine, or 1 oz of hard liquor. Safety  If you need help walking, use a cane or walker as told by your health care provider.  Take steps to lower the risk of falls in your home. This may include: ? Using safety equipment, such as raised toilets and a seat in the shower. ? Removing clutter and tripping hazards from walkways, such as cords or rugs. ? Installing grab bars in the bedroom and bathroom. Activity  Exercise regularly, as told by your health care provider.  Take part in rehabilitation programs as told by your health care provider. This may include physical therapy, occupational therapy, or speech therapy. General instructions  Do not use any tobacco products, such as cigarettes, chewing tobacco, and e-cigarettes. If you need help quitting, ask your health care provider.  Keep all follow-up visits as told by your health care provider. This is important. Get help right away if:  You have any symptoms of stroke. "BE FAST" is an easy way to remember the main warning signs of stroke: ? B - Balance. Signs are dizziness, sudden trouble walking, or loss of balance. ? E - Eyes. Signs are trouble seeing or a sudden change in vision. ? F - Face. Signs are sudden weakness or numbness of the face, or the face or eyelid drooping on one side. ? A - Arms. Signs are weakness or numbness in an arm. This happens suddenly and usually on one side of the body. ? S - Speech. Signs are sudden trouble speaking, slurred speech, or trouble understanding what people say. ? T - Time. Time to call emergency services. Write down what time symptoms started.  You have other signs of stroke, such as: ? A sudden, severe headache with no known  cause. ? Nausea or vomiting. ? Seizure.  You have a severe fall or injury. These symptoms may represent a serious problem that is an emergency. Do not wait to see if the symptoms will go away. Get medical help right away. Call your local emergency services (911 in the [place] Do not drive yourself to the hospital. Summary  A lacunar stroke (lacunar infarction) is a blockage of one of the small arteries deep in the brain. When one of these arteries is blocked, parts of the brain do not get enough oxygen and may die.  This condition is a medical emergency that must be treated right away. Treatments must be done within 4.5 hours of the start of the stroke.  Controlling your risk factors for stroke is the best way to avoid another lacunar stroke.  Get help right away if you have any symptoms of stroke. "BE FAST" is an easy way to remember the main warning signs of stroke. This information is not intended to replace advice given to you by your health care provider. Make sure you discuss  any questions you have with your health care provider. Document Released: 02/23/2017 Document Revised: 02/23/2017 Document Reviewed: 02/23/2017 Elsevier Interactive Patient Education  2018 [employer].  Clopidogrel tablets What is this medicine? CLOPIDOGREL (kloh PID oh grel) helps to prevent blood clots. This medicine is used to prevent heart attack, stroke, or other vascular events in people who are at high risk. This medicine may be used for other purposes; ask your health care provider or pharmacist if you have questions. COMMON BRAND NAME(S): Plavix What should I tell my health care provider before I take this medicine? They need to know if you have any of the following conditions: -bleeding disorders -bleeding in the brain -having surgery -history of stomach bleeding -an unusual or allergic reaction to clopidogrel, other medicines, foods, dyes, or preservatives -pregnant or trying to get  pregnant -breast-feeding How should I use this medicine? Take this medicine by mouth with a glass of water. Follow the directions on the prescription label. You may take this medicine with or without food. If it upsets your stomach, take it with food. Take your medicine at regular intervals. Do not take it more often than directed. Do not stop taking except on your doctor's advice. A special MedGuide will be given to you by the pharmacist with each prescription and refill. Be sure to read this information carefully each time. Talk to your pediatrician regarding the use of this medicine in children. Special care may be needed. Overdosage: If you think you have taken too much of this medicine contact a poison control center or emergency room at once. NOTE: This medicine is only for you. Do not share this medicine with others. What if I miss a dose? If you miss a dose, take it as soon as you can. If it is almost time for your next dose, take only that dose. Do not take double or extra doses. What may interact with this medicine? Do not take this medicine with the following medications: -dasabuvir; ombitasvir; paritaprevir; ritonavir -defibrotide This medicine may also interact with the following medications: -antiviral medicines for HIV or AIDS -aspirin -certain medicines for depression like citalopram, fluoxetine, fluvoxamine -certain medicines for fungal infections like ketoconazole, fluconazole, voriconazole -certain medicines for seizures like felbamate, oxcarbazepine, phenytoin -certain medicines for stomach problems like cimetidine, omeprazole, esomeprazole -certain medicines that treat or prevent blood clots like warfarin, enoxaparin, dalteparin, apixaban, dabigatran, rivaroxaban, ticlopidine -chloramphenicol -cilostazol -fluvastatin -isoniazid -modafinil -nicardipine -NSAIDS, medicines for pain and inflammation, like ibuprofen or  naproxen -quinine -repaglinide -tamoxifen -tolbutamide -topiramate -torsemide This list may not describe all possible interactions. Give your health care provider a list of all the medicines, herbs, non-prescription drugs, or dietary supplements you use. Also tell them if you smoke, drink alcohol, or use illegal drugs. Some items may interact with your medicine. What should I watch for while using this medicine? Visit your doctor or health care professional for regular check ups. Do not stop taking your medicine unless your doctor tells you to. Notify your doctor or health care professional and seek emergency treatment if you develop breathing problems; changes in vision; chest pain; severe, sudden headache; pain, swelling, warmth in the leg; trouble speaking; sudden numbness or weakness of the face, arm or leg. These can be signs that your condition has gotten worse. If you are going to have surgery or dental work, tell your doctor or health care professional that you are taking this medicine. Certain genetic factors may reduce the effect of this medicine. Your doctor may use  genetic tests to determine treatment. What side effects may I notice from receiving this medicine? Side effects that you should report to your doctor or health care professional as soon as possible: -allergic reactions like skin rash, itching or hives, swelling of the face, lips, or tongue -signs and symptoms of bleeding such as bloody or black, tarry stools; red or dark-brown urine; spitting up blood or brown material that looks like coffee grounds; red spots on the skin; unusual bruising or bleeding from the eye, gums, or nose -signs and symptoms of a blood clot such as breathing problems; changes in vision; chest pain; severe, sudden headache; pain, swelling, warmth in the leg; trouble speaking; sudden numbness or weakness of the face, arm or leg Side effects that usually do not require medical attention (report to your  doctor or health care professional if they continue or are bothersome): -constipation -diarrhea -headache -upset stomach This list may not describe all possible side effects. Call your doctor for medical advice about side effects. You may report side effects to FDA at 1-800-FDA-1088. Where should I keep my medicine? Keep out of the reach of children. Store at room temperature of 59 to 86 degrees F (15 to 30 degrees C). Throw away any unused medicine after the expiration date. NOTE: This sheet is a summary. It may not cover all possible information. If you have questions about this medicine, talk to your doctor, pharmacist, or health care provider.  2018 Elsevier/Gold Standard (2015-07-15 10:00:44)

## 2018-01-10 ENCOUNTER — Ambulatory Visit (INDEPENDENT_AMBULATORY_CARE_PROVIDER_SITE_OTHER): Payer: Medicare Other | Admitting: Neurology

## 2018-01-10 ENCOUNTER — Encounter: Payer: Self-pay | Admitting: Neurology

## 2018-01-10 VITALS — BP 123/74 | HR 63 | Ht 66.0 in | Wt 205.0 lb

## 2018-01-10 DIAGNOSIS — R0683 Snoring: Secondary | ICD-10-CM

## 2018-01-10 DIAGNOSIS — I495 Sick sinus syndrome: Secondary | ICD-10-CM

## 2018-01-10 DIAGNOSIS — I6381 Other cerebral infarction due to occlusion or stenosis of small artery: Secondary | ICD-10-CM | POA: Diagnosis not present

## 2018-01-10 DIAGNOSIS — Z950 Presence of cardiac pacemaker: Secondary | ICD-10-CM | POA: Diagnosis not present

## 2018-01-10 DIAGNOSIS — I609 Nontraumatic subarachnoid hemorrhage, unspecified: Secondary | ICD-10-CM

## 2018-01-10 DIAGNOSIS — G4719 Other hypersomnia: Secondary | ICD-10-CM | POA: Diagnosis not present

## 2018-01-10 DIAGNOSIS — I4821 Permanent atrial fibrillation: Secondary | ICD-10-CM

## 2018-01-10 NOTE — Progress Notes (Signed)
SLEEP MEDICINE CLINIC   Provider:  Larey Seat, [place] D  Primary Care Physician:  [name], MD   Referring Provider: Dr. Sarina Ill, MD    Chief Complaint  Patient presents with  . New Patient (Initial Visit)    pt alone, rm10. pt states that she is able to go to sleep but cant stay asleep. denies snoring or apnea in sleep.    HPI:  Heather May is a 72 y.o. female , seen here  in a referral from Dr. Jaynee Eagles, whose note I have copied below. 01-10-2018 .   Chief complaint according to patient : "The fall asleep a lot while watching TV in daytime, and at nighttime I may have hours before I can go to sleep" . She used to love going to this enema but now has stopped because she " sleeps through "  Sleep habits are as follows: The patient spends her evening hours watching TV.  She does have trouble with ambulation and is using a walker that is seated, at home she will watch TV while seated in her favorite recliner, she estimates that she will fall asleep for a total time of about an hour while in front of the TV between 7 and 10 PM.  She will retreat to get ready for bed at about 1130 or midnight, probably late.  While in bed she has often trouble initiating sleep.  Her bedroom is described as cool, quiet and dark.  She is attached to a heart monitor at night but admits some blue light, and she does use a night light.  Once asleep she can stay asleep on average for 3 hours before she wakes up.  She does not know why she wakes up, there is no specific discomfort she does not usually have to go to the bathroom sometimes she has noted a dry mouth but this did not wake her.,  Her partner is using a CPAP and is usually asleep and has not witnessed her to have apnea or snoring.  However she has heard before that she is a snorer, and this has been known for many years. Her husband use to wake her when she stopped breathing - 15 years ago.  She rises at varius times, is retired, has no routine.  She goes to water gymnastics or water aerobics twice a week when she has to rise earlier, around 6.30 AM. The exercise begins at 9 AM.  All other days she stays longer in bed and sometimes until noon. She spends a lot of time in bed without sleeping , averaging still 9-10 hours. She does not schedule any naps for the daytime but she does frequently doze off when physically not active and mentally not stimulated.  She has not fallen asleep while in a conversation but certainly in the waiting room, while reading.  Today's Epworth sleepiness score was endorsed at 13 points, fatigue severity at only 17 points and the geriatric depression score at 2 out of 15 points.  The patient does not endorsed tinnitus, nausea, palpitations, diaphoresis, headaches waking her .   Sleep medical history and family sleep history: She has polyneuropathy from diabetes (Last hgba1c in my records 8.2 10/2015, b12 and tsh normal 05/2016).  Patient has a past medical history of atrial fibrillation, CAD s/p stents,  bradycardia s/p pacemaker, type 2 diabetes, hyperlipidemia, essential tremor, cerebral infarction, hypertension, peripheral vascular disease, spinal stenosis, pain in limb, seizure.She walks, go to the gym, go to movies, socially  active. CT of the head She nods off a lot during the day and naps. She wakes at night frequently. But ESS only 5/10. She has dry mouth in the morning and difficulty swallowing. Occ wakes herself up snoring.   Social history: lives with a  partner, widowed 15 years ago. Husband had a PE.  She has one cat, retired since 2012 , from [employer]. Quit smoking over 30 years ago, not using ETOH, caffeine: iced tea made of herbal tea. Decaffeinated  coffee once a day.      Dr Cathren Laine notes:" Patient is her for a new problem, new stroke found on CT head.  Appears to be small-vessel disease due to location and size - however cannot rule out afib. Will send notes to Dr. Joylene Grapes and would not rule  out starting Eliquis in the future. Had a long discussion with patient and her partner, reviewed images and answered questions. She has had a watchman procedure with Dr. Rayann Heman , an umbrella protecting her from an apical thrombotic formation, placed in 2017 .  CT 12/22/2017: Abnormal CT scan of the head showing remote age right internal capsule infarct and mild age-appropriate changes of chronic microvascular ischemia and generalized cerebral atrophy.  Compared to the CT head dated 06/30/2016 the right internal capsule infarct appears to be new  Interval history 12/11/2016: Patient has a new issue today likely sleep apnea, cardiologist stated she should have a sleep eval. 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  Patient has a past medical history of atrial fibrillation, CAD s/p stents,  bradycardia s/p pacemaker, type 2 diabetes, hyperlipidemia, essential tremor, cerebral infarction, hypertension, peripheral vascular disease, spinal stenosis, pain in limb, seizure. ESS 12. She is still on the keppra. Discussed discontinuing. She is excessively tired, no "ambition", she sits in her recliner all day and nods off, tired all the time, snoring. 6 weeks ago she was watching her grandchildren, she had just awakened from sleep, she felt all "floppy" and she sat inher recliner all day, a lot more fatigue, she thought the right side of her mouth was drooping for a few weeks but no one else saw it and nothing else focal she just felt fatigues for a few weeks. 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 CT head to eval for any new strokes.     Review of Systems: Out of a complete 14 system review, the patient complains of only the following symptoms, and all other reviewed systems are negative.   quoted above .  Epworth score  13/ 24  ,  Fatigue severity score of 22/ 63   , depression score 2/15    Social History    Socioeconomic History  . Marital status: Widowed    Spouse name: Not on file  . Number of children: 5  . Years of education: 1  . Highest education level: Not on file  Occupational History  . Occupation: Retired  Scientific laboratory technician  . Financial resource strain: Not on file  . Food insecurity:    Worry: Not on file    Inability: Not on file  . Transportation needs:    Medical: Not on file    Non-medical: Not on file  Tobacco Use  . Smoking status: Former Smoker    Last attempt to quit: 07/06/2009    Years since quitting: 8.5  . Smokeless tobacco: Never Used  .  Tobacco comment: didnt smoke much  Substance and Sexual Activity  . Alcohol use: No    Alcohol/week: 0.0 oz    Comment: very infrequently  . Drug use: No  . Sexual activity: Not on file  Lifestyle  . Physical activity:    Days per week: Not on file    Minutes per session: Not on file  . Stress: Not on file  Relationships  . Social connections:    Talks on phone: Not on file    Gets together: Not on file    Attends religious service: Not on file    Active member of club or organization: Not on file    Attends meetings of clubs or organizations: Not on file    Relationship status: Not on file  . Intimate partner violence:    Fear of current or ex partner: Not on file    Emotionally abused: Not on file    Physically abused: Not on file    Forced sexual activity: Not on file  Other Topics Concern  . Not on file  Social History Narrative   [place] recently moved to [facility] [place].    Previously worked as a Teacher, music.   Caffeine use: only when eating out tea/soda   Right-handed   Lives at home with partner/roommate    Family History  Problem Relation Age of Onset  . Stroke Mother   . Epilepsy Mother   . Stroke Father   . Heart attack Father   . Atrial fibrillation Sister   . Atrial fibrillation Brother   . Diabetes Mellitus II Brother   . Pulmonary embolism Son     Past Medical  History:  Diagnosis Date  . CAD (coronary artery disease) 08/15/13  . Diabetes mellitus without complication ([place])   . Diverticulosis   . Essential tremor   . GERD (gastroesophageal reflux disease)   . History of long-term use of multiple prescription drugs   . Hyperlipidemia    mixed  . Hypertension   . Long-term (current) use of anticoagulants   . PAD (peripheral artery disease) ([place])   . Percutaneous transluminal coronary angioplasty status   . Permanent atrial fibrillation (HCC)    chads2vasc score is at least 5  . Pulmonary embolism ([place])   . Pulmonary fibrosis ([place])   . Seizure ([place]) 2014   occured in setting of [place]  . Sick sinus syndrome ([place])   . Spinal stenosis   . Subarachnoid hemorrhage ([place])   . Transient ischemic attack     Past Surgical History:  Procedure Laterality Date  . ABDOMINAL HYSTERECTOMY  2004  . BASAL CELL CARCINOMA EXCISION    . CARDIAC CATHETERIZATION  10/14   PCI   . carpel tunnel surgery Bilateral 2006/ 2010  . CESAREAN SECTION N/A 1978   x 1  . HERNIA REPAIR  06/2010  . LEFT ATRIAL APPENDAGE OCCLUSION N/A 08/26/2015   Procedure: LEFT ATRIAL APPENDAGE OCCLUSION;  Surgeon: Thompson Grayer, MD;  Location: [facility];  Service: Cardiovascular;  Laterality: N/A;  . PACEMAKER INSERTION  03/05/15   [name] Accolade DR implanted in [place]  . removal of big toe nail Bilateral   . TEE WITHOUT CARDIOVERSION N/A 08/16/2015   Procedure: TRANSESOPHAGEAL ECHOCARDIOGRAM (TEE);  Surgeon: Pixie Casino, MD;  [facility] ENDOSCOPY;  Service: Cardiovascular;  Laterality: N/A;  . TEE WITHOUT CARDIOVERSION N/A 10/12/2015   Procedure: TRANSESOPHAGEAL ECHOCARDIOGRAM (TEE);  Surgeon: Pixie Casino, MD;  Location:  [facility] ENDOSCOPY;  Service: Cardiovascular;  Laterality: N/A;  . titanium pins inserted metatarsol bone  02/2012   2 pins placed     Current Outpatient Medications  Medication Sig Dispense Refill  . benazepril (LOTENSIN) 10 MG  tablet Take 10 mg by mouth daily.    . cetirizine (ZYRTEC) 10 MG tablet Take 10 mg by mouth daily as needed.   5  . clopidogrel (PLAVIX) 75 MG tablet Take 1 tablet (75 mg total) by mouth daily. 30 tablet 11  . diltiazem (CARDIZEM) 30 MG tablet TAKE 1 TABLET(30 MG) BY MOUTH DAILY AS NEEDED 30 tablet 8  . glipiZIDE (GLUCOTROL) 5 MG tablet Take 5 mg by mouth 2 (two) times daily.  3  . levETIRAcetam (KEPPRA) 500 MG tablet Take 500 mg by mouth daily.     [name] lovastatin (MEVACOR) 40 MG tablet Take 40 mg by mouth at bedtime.    . metFORMIN (GLUCOPHAGE) 850 MG tablet Take 850 mg by mouth 2 (two) times daily with a meal.    . Multiple Vitamin (MULTIVITAMIN) tablet Take 1 tablet by mouth daily.    . nitroGLYCERIN (NITROSTAT) 0.4 MG SL tablet Place 0.4 mg under the tongue every 5 (five) minutes as needed for chest pain.    [name] ondansetron (ZOFRAN) 4 MG tablet Take 4 mg by mouth every 8 (eight) hours as needed for nausea or vomiting.    [name] OVER THE COUNTER MEDICATION Take 1 Dose by mouth daily. OTC Joint Juice "1,500 mg of glucosamine plus chondroitin, Vitamin C and Vitamin D"    . RaNITidine HCl (ZANTAC PO) Take by mouth.     No current facility-administered medications for this visit.     Allergies as of 01/10/2018 - Review Complete 01/10/2018  Allergen Reaction Noted  . Hydrocodone Nausea And Vomiting 12/11/2017  . Sulfa antibiotics  07/07/2015    Vitals: BP 123/74   Pulse 63   Ht 5\' 6"  (1.676 m)   Wt 205 lb (93 kg)   BMI 33.09 kg/m  Last Weight:  Wt Readings from Last 1 Encounters:  01/10/18 205 lb (93 kg)   JOA:CZYS mass index is 33.09 kg/m.     Last Height:   Ht Readings from Last 1 Encounters:  01/10/18 5\' 6"  (1.676 m)    Physical exam:  General: The patient is awake, alert and appears not in acute distress. The patient is well groomed. Head: Normocephalic, atraumatic. Neck is supple. Mallampati 5 - not able to see uvula. Pale mucosa. ,  neck circumference: 15.75. Nasal airflow  patent ,Retrognathia is not seen. Jaw tremor. Titubation, tongue tremor.   Cardiovascular:  Regular rate and rhythm , without  murmurs or carotid bruit, and without distended neck veins. Respiratory: Lungs are clear to auscultation. Skin:  Without evidence of edema, or rash Trunk: BMI is 33. The patient has a truncal tremor- Neurologic exam : The patient is  alert, oriented to place and time.   Memory subjective  described as intact.   Attention span & concentration ability appears normal.  Speech is fluent,  with dysphonia .  Mood and affect are appropriate.  Cranial nerves: Pupils are equal and briskly reactive to light. Funduscopic exam without evidence of pallor or edema. Early cataracts.   Ptosis on the right. Dry eyes. Extraocular movements  in vertical and horizontal planes intact and without nystagmus. Visual fields deferred.  Facial sensation intact to fine touch.Facial motor strength is symmetric and tongue and uvula move midline. Shoulder shrug was symmetrical.  Motor exam:  Right sided weakness after SAH- increased tone, brisk reflexes, coordination in right dominant hand impaired.,tremor is essential - or basal ganglion related. Sensory:  Fine touch, pinprick and vibration were decreased in right hand.  Coordination: right Finger-to-nose maneuver with evidence of ataxia, dysmetria and  tremor.  Gait and station: deferred    I would like to thank Dr. Jaynee Eagles for sending this pleasant patient to my weight.  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  She had a single seizure, she has reportedly been excessively daytime sleepy but she also has spent an extraordinary amount of time in  bed often without finding her way to sleep.  Spinal stenosis is also listed as a diagnosis and a cerebral infarction according to the patient she suffered a subarachnoid hemorrhage.  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  I would like to invite this patient with so many comorbidities for an attended sleep study.  Before she will come to the sleep lab I would like her to advance her bedtime to a time between 10 and 11 PM so that she has a better chance of falling asleep earlier.  This will also help [place] to get more sleep recorded.  My goal is to use a split night polysomnography which allows diagnosis and treatment in the same night.  Given that the patient has permanent atrial fibrillation, peripheral artery disease, coronary artery disease, history of pulmonary embolism, pulmonary fibrosis, sick sinus syndrome I would like for her very much to be attended.  I will follow-up with the patient after her split-night polysomnography.  I have advised the patient that I will be not in the office between the 8 and 25 April and that her sleep study should take place in that time may not be interpreted until I return.   Assessment:  After physical and neurologic examination, review of laboratory studies,  Personal review of imaging studies, reports of other /same  Imaging studies, results of polysomnography and / or neurophysiology testing and pre-existing records as far as provided in visit., my assessment is   1) EDS- excessive daytime sleepiness.  High risk for CSA and OSA- complex apnea likely, cannot have unattended sleep study.   See above - SPLIT at AHI 20, watch for hypoxemia, titrate to oxygen if the possibility arises.    The patient was advised of the nature of the diagnosed disorder , the treatment options and the  risks for general health and wellness arising from not treating the condition.   I spent more than 50 minutes of face to face time with the patient.  Greater  than 50% of time was spent in counseling and coordination of care. We have discussed the diagnosis and differential and I answered the patient's questions.     Advance bedtime to before midnight- try for 10 Pm, rise always the same time, avoid daytime naps.    No follow-ups on file.   Larey Seat, MD 4/70/9628, 36:62 PM  Certified in Neurology by ABPN Certified in [place] by [facility] [address] [place], [place] [address]

## 2018-01-22 ENCOUNTER — Ambulatory Visit (INDEPENDENT_AMBULATORY_CARE_PROVIDER_SITE_OTHER): Payer: Medicare Other | Admitting: *Deleted

## 2018-01-22 DIAGNOSIS — R001 Bradycardia, unspecified: Secondary | ICD-10-CM | POA: Diagnosis not present

## 2018-01-22 NOTE — Progress Notes (Signed)
Remote pacemaker transmission.   

## 2018-01-23 ENCOUNTER — Encounter: Payer: Self-pay | Admitting: Cardiology

## 2018-01-23 DIAGNOSIS — M170 Bilateral primary osteoarthritis of knee: Secondary | ICD-10-CM | POA: Diagnosis not present

## 2018-01-28 DIAGNOSIS — M170 Bilateral primary osteoarthritis of knee: Secondary | ICD-10-CM | POA: Diagnosis not present

## 2018-02-04 DIAGNOSIS — I10 Essential (primary) hypertension: Secondary | ICD-10-CM | POA: Diagnosis not present

## 2018-02-04 DIAGNOSIS — M170 Bilateral primary osteoarthritis of knee: Secondary | ICD-10-CM | POA: Diagnosis not present

## 2018-02-04 DIAGNOSIS — M549 Dorsalgia, unspecified: Secondary | ICD-10-CM | POA: Diagnosis not present

## 2018-02-04 DIAGNOSIS — Z6834 Body mass index (BMI) 34.0-34.9, adult: Secondary | ICD-10-CM | POA: Diagnosis not present

## 2018-02-04 DIAGNOSIS — E119 Type 2 diabetes mellitus without complications: Secondary | ICD-10-CM | POA: Diagnosis not present

## 2018-02-07 DIAGNOSIS — M170 Bilateral primary osteoarthritis of knee: Secondary | ICD-10-CM | POA: Diagnosis not present

## 2018-02-07 DIAGNOSIS — Z6835 Body mass index (BMI) 35.0-35.9, adult: Secondary | ICD-10-CM | POA: Diagnosis not present

## 2018-02-07 DIAGNOSIS — E119 Type 2 diabetes mellitus without complications: Secondary | ICD-10-CM | POA: Diagnosis not present

## 2018-02-07 DIAGNOSIS — I10 Essential (primary) hypertension: Secondary | ICD-10-CM | POA: Diagnosis not present

## 2018-02-07 DIAGNOSIS — M549 Dorsalgia, unspecified: Secondary | ICD-10-CM | POA: Diagnosis not present

## 2018-02-08 ENCOUNTER — Ambulatory Visit (INDEPENDENT_AMBULATORY_CARE_PROVIDER_SITE_OTHER): Payer: Medicare Other | Admitting: Neurology

## 2018-02-08 DIAGNOSIS — I495 Sick sinus syndrome: Secondary | ICD-10-CM

## 2018-02-08 DIAGNOSIS — G4731 Primary central sleep apnea: Secondary | ICD-10-CM

## 2018-02-08 DIAGNOSIS — I4821 Permanent atrial fibrillation: Secondary | ICD-10-CM

## 2018-02-08 DIAGNOSIS — G4719 Other hypersomnia: Secondary | ICD-10-CM

## 2018-02-08 DIAGNOSIS — Z950 Presence of cardiac pacemaker: Secondary | ICD-10-CM

## 2018-02-08 DIAGNOSIS — G4739 Other sleep apnea: Secondary | ICD-10-CM

## 2018-02-08 DIAGNOSIS — I609 Nontraumatic subarachnoid hemorrhage, unspecified: Secondary | ICD-10-CM

## 2018-02-08 DIAGNOSIS — R0683 Snoring: Secondary | ICD-10-CM

## 2018-02-12 LAB — CUP PACEART REMOTE DEVICE CHECK
Battery Remaining Longevity: 72 mo
Battery Remaining Percentage: 100 %
Brady Statistic RA Percent Paced: 0 %
Brady Statistic RV Percent Paced: 62 %
Date Time Interrogation Session: 20190402084100
Implantable Pulse Generator Implant Date: 20160513
Lead Channel Impedance Value: 376 Ohm
Lead Channel Impedance Value: 659 Ohm
Lead Channel Pacing Threshold Amplitude: 1.8 V
Lead Channel Pacing Threshold Pulse Width: 0.4 ms
Lead Channel Setting Pacing Amplitude: 2.3 V
Lead Channel Setting Pacing Pulse Width: 0.4 ms
Lead Channel Setting Sensing Sensitivity: 0.6 mV
Pulse Gen Serial Number: 732886

## 2018-02-18 DIAGNOSIS — M170 Bilateral primary osteoarthritis of knee: Secondary | ICD-10-CM | POA: Diagnosis not present

## 2018-02-21 DIAGNOSIS — Z6835 Body mass index (BMI) 35.0-35.9, adult: Secondary | ICD-10-CM | POA: Diagnosis not present

## 2018-02-21 DIAGNOSIS — M170 Bilateral primary osteoarthritis of knee: Secondary | ICD-10-CM | POA: Diagnosis not present

## 2018-02-21 DIAGNOSIS — I10 Essential (primary) hypertension: Secondary | ICD-10-CM | POA: Diagnosis not present

## 2018-02-21 DIAGNOSIS — E119 Type 2 diabetes mellitus without complications: Secondary | ICD-10-CM | POA: Diagnosis not present

## 2018-02-21 DIAGNOSIS — M549 Dorsalgia, unspecified: Secondary | ICD-10-CM | POA: Diagnosis not present

## 2018-02-24 NOTE — Addendum Note (Signed)
Addended by: Larey Seat on: 02/24/2018 04:14 PM   Modules accepted: Orders

## 2018-02-24 NOTE — Procedures (Signed)
PATIENT'S NAME:  Heather May, Heather May DOB:      1945/11/30      MR#:    [id number]     DATE OF RECORDING: 02/08/2018 REFERRING M.D.:  Sarina Ill, MD;Elizabeth Ernie Hew, M.D. Study Performed:  Split-Night Titration Study HISTORY:  Heather May is a 72 y.o. female patient of Dr. Jaynee Eagles, whose chief complaint consists of:  "I am falling asleep while watching TV in daytime, and at nighttime I may have hours before I can go to sleep". She used to love going to the cinema but now has stopped because she "sleeps through" any movie. She was evaluated by Dr. Rayann Heman for atrial fibrillation and he recommended a sleep study. The patient had a watchman procedure and pacemaker. She lacks sleep routines, and she has DM, neuropathy, obesity, CAD, atrial fibrillation, Essential Tremor, HTN, GERD and hyperlipidemia.  She reports that she doesn't snore. Family history positive for atrial fib and PE.   The patient endorsed the Epworth Sleepiness Scale at 13/24 points and FSS at 17 points.    The patient's weight 205 pounds with a height of 66 (inches), resulting in a BMI of 33.2 kg/m2. The patient's neck circumference measured 15.8 inches.  CURRENT MEDICATIONS: Lotensin, Zyrtec, Plavix, Cardizem, Glucotrol, Keppra, Mevacor, Glucophage, Multivitamin, Nitrostat, Zofran, over the counter vitamins, Zantac   PROCEDURE:  This is a multichannel digital polysomnogram utilizing the SomnoStar 11.2 system.  Electrodes and sensors were applied and monitored per AASM Specifications.   EEG, EOG, Chin and Limb EMG, were sampled at 200 Hz.  ECG, Snore and Nasal Pressure, Thermal Airflow, Respiratory Effort, CPAP Flow and Pressure, Oximetry was sampled at 50 Hz. Digital video and audio were recorded.      BASELINE STUDY WITHOUT CPAP RESULTS: Lights Out was at 21:05 and Lights On at 05:02.  Total recording time (TRT) was 224.5, with a total sleep time (TST) of 129 minutes.   The patient's sleep latency was 88 minutes.  REM latency was 78 minutes.  The  sleep efficiency was very poor at  57.5 %.    SLEEP ARCHITECTURE: WASO (Wake after sleep onset) was 6.5 minutes, Stage N1 was 4.5 minutes, Stage N2 was 108 minutes, Stage N3 was 0 minutes and Stage R (REM sleep) was 16.5 minutes.  The percentages were Stage N1 3.5%, Stage N2 83.7%, Stage N3 0% and Stage R (REM sleep) 12.8%.   RESPIRATORY ANALYSIS:  There were a total of 68 respiratory events:  12 obstructive apneas, 3 central apneas and 14 mixed apneas with a total of 29 apneas and an apnea index (AI) of 13.5.  There were 39 hypopneas with a hypopnea index of 18.1. The patient also had 0 respiratory event related arousals (RERAs).     The total APNEA/HYPOPNEA INDEX (AHI) was 31.6 /hour and the total RESPIRATORY DISTURBANCE INDEX was 31.6 /hour.  8 events occurred in REM sleep and 87 events in NREM. The REM AHI was 29.1, /hour versus a non-REM AHI of 32 /hour. The patient spent 321.5 minutes sleep time in the supine position 39 minutes in non-supine. The supine AHI was 34.4 /hour versus a non-supine AHI of 13.7 /hour.  OXYGEN SATURATION & C02:  The wake baseline 02 saturation was 97%, with the lowest being 90%. Time spent below 89% saturation equaled 0 minutes.   PERIODIC LIMB MOVEMENTS: The patient had a total of 0 Periodic Limb Movements.   The arousals were noted as: 20 were spontaneous, 0 were associated with PLMs, and 30 were associated with  respiratory events. Audio and video analysis did not show any abnormal or unusual movements, behaviors, phonations or vocalizations. Mild Snoring was noted, in supine only.  Paced EKG with variable QRST formation, lack of p wave.    TITRATION STUDY WITH CPAP RESULTS: CPAP was initiated at 5 cmH20 with heated humidity per AASM split night standards and pressure was advanced to 14 cm water without resolution or even downward trend in AHI. Central apneas had emerged. Mr. Tommie Raymond changed to BIPAP at 16/12 cmH20 because of hypopneas, central apneas and  desaturations.   At a BiPAP pressure of 16/12 cmH20, there was a reduction of the AHI to 5.1 /hour whilst using a FFM Simplus in small size. Total recording time (TRT) was 253 minutes, with a total sleep time (TST) of 231.5 minutes. The patient's sleep latency was 0.5 minutes. REM latency was 110.5 minutes.  The sleep efficiency was 91.5 % in contrast to pre PAP sleep efficiency.    SLEEP ARCHITECTURE: Wake after sleep was 21 minutes, Stage N1 12 minutes, Stage N2 160.5 minutes, Stage N3 5 minutes and Stage R (REM sleep) 54 minutes. The percentages were: Stage N1 5.2%, Stage N2 69.3%, Stage N3 2.2% and Stage R (REM sleep) 23.3%. The sleep architecture was notable for REM sleep rebound in spite of high REM AHI.  RESPIRATORY ANALYSIS:  There were a total of 109 respiratory events: 20 obstructive apneas, 39 central apneas and 14 mixed apneas with a total of 73 apneas and an apnea index (AI) of 18.9. There were 36 hypopneas with a hypopnea index of 9.3 /hour. The patient also had 0 respiratory event related arousals (RERAs).The total APNEA/HYPOPNEA INDEX (AHI) was 28.3 /hour and the total RESPIRATORY DISTURBANCE INDEX was 28.3 /hour.  41 events occurred in REM sleep and 68 events in NREM. The REM AHI was 45.6 /hour versus a non-REM AHI of 23.0 /hour. The patient spent 91% of total sleep time in the supine position. The supine AHI was 31.2 /hour, versus a non-supine AHI of 0.0/hour.  OXYGEN SATURATION & C02:  The wake baseline 02 saturation was 96%, with the lowest being 88%. Time spent below 89% saturation equaled 0 minutes.  PERIODIC LIMB MOVEMENTS:    The patient had a total of 0 Periodic Limb Movements. The arousals were noted as: 14 were spontaneous, 0 were associated with PLMs, and 28 were associated with respiratory events. Post-study, the patient indicated that sleep was worse than usual.  The patient was fitted with a Fisher and Paykel FFM model: Simplus in small size.   POLYSOMNOGRAPHY IMPRESSION  :   1. 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   2. Mild Primary Snoring 3. REM accentuated apnea.     RECOMMENDATIONS:  This Positive Airway Pressure Titration was incomplete and an optimal pressure on BiPAP was not identified. I recommend a return for a full night BiPAP titration for the patient being placed on BiPAP with 4 cm spread of inspiratory versus expiratory pressure, for a pressure window from 14/ 10 through maximally 20/ 16 cm water to be explored.      A follow up appointment will be scheduled in the Sleep Clinic at [facility], [phone]. The referring physician will receive the result of this sleep study.        I certify that I have reviewed the entire raw data  recording prior to the issuance of this report in accordance with the Standards of Accreditation of the American Academy of Sleep Medicine (AASM)    Larey Seat, M.D.    02-24-2018  Diplomat, American Board of Psychiatry and Neurology  Diplomat, American Board of Sleep Medicine Medical Director, [place] [facility]

## 2018-02-25 ENCOUNTER — Telehealth: Payer: Self-pay

## 2018-02-25 DIAGNOSIS — Z0000 Encounter for general adult medical examination without abnormal findings: Secondary | ICD-10-CM | POA: Diagnosis not present

## 2018-02-25 DIAGNOSIS — M170 Bilateral primary osteoarthritis of knee: Secondary | ICD-10-CM | POA: Diagnosis not present

## 2018-02-25 DIAGNOSIS — Z6833 Body mass index (BMI) 33.0-33.9, adult: Secondary | ICD-10-CM | POA: Diagnosis not present

## 2018-02-25 NOTE — Telephone Encounter (Signed)
Called patient to schedule BiPAP study.  Explained results of sleep study from 02/08/18. Pt understood and had no questions at this time. Pt is sched for BiPAP study on 03/18/18

## 2018-02-26 ENCOUNTER — Encounter: Payer: Self-pay | Admitting: Physician Assistant

## 2018-03-04 DIAGNOSIS — M170 Bilateral primary osteoarthritis of knee: Secondary | ICD-10-CM | POA: Diagnosis not present

## 2018-03-04 DIAGNOSIS — Z79899 Other long term (current) drug therapy: Secondary | ICD-10-CM | POA: Diagnosis not present

## 2018-03-06 DIAGNOSIS — Z6832 Body mass index (BMI) 32.0-32.9, adult: Secondary | ICD-10-CM | POA: Diagnosis not present

## 2018-03-06 DIAGNOSIS — I10 Essential (primary) hypertension: Secondary | ICD-10-CM | POA: Diagnosis not present

## 2018-03-06 DIAGNOSIS — E119 Type 2 diabetes mellitus without complications: Secondary | ICD-10-CM | POA: Diagnosis not present

## 2018-03-06 DIAGNOSIS — E7800 Pure hypercholesterolemia, unspecified: Secondary | ICD-10-CM | POA: Diagnosis not present

## 2018-03-15 ENCOUNTER — Encounter: Payer: Self-pay | Admitting: Physician Assistant

## 2018-03-15 ENCOUNTER — Ambulatory Visit (INDEPENDENT_AMBULATORY_CARE_PROVIDER_SITE_OTHER): Payer: Medicare Other | Admitting: Physician Assistant

## 2018-03-15 ENCOUNTER — Telehealth: Payer: Self-pay | Admitting: Emergency Medicine

## 2018-03-15 VITALS — BP 118/70 | HR 78 | Ht 66.0 in | Wt 203.2 lb

## 2018-03-15 DIAGNOSIS — R1030 Lower abdominal pain, unspecified: Secondary | ICD-10-CM | POA: Diagnosis not present

## 2018-03-15 DIAGNOSIS — Z7901 Long term (current) use of anticoagulants: Secondary | ICD-10-CM

## 2018-03-15 DIAGNOSIS — R194 Change in bowel habit: Secondary | ICD-10-CM | POA: Diagnosis not present

## 2018-03-15 MED ORDER — NA SULFATE-K SULFATE-MG SULF 17.5-3.13-1.6 GM/177ML PO SOLN: 1.0000 | ORAL | 0 refills | Status: DC

## 2018-03-15 NOTE — Telephone Encounter (Signed)
There is a small but acceptable risk of stroke while off of the Plavix. If possible, would prefer if she could stay on a baby aspirin while off of Plavix but that is up to the performing physician based on risks vs benefits. Please inform patient that there is risk of stroke while off of Plavix but I approve this for colonoscopy, risks justify the benefits. Just please counsel patien ton the risks.

## 2018-03-15 NOTE — Telephone Encounter (Signed)
Left message on patients voicemail to call office.   

## 2018-03-15 NOTE — Telephone Encounter (Signed)
Spoke to patient and informed her to hold Plavix 5 days before procedure and to take ASA 81 mg in the interim. She understands the risk of stroke while holding Plavix. She understands to resume Plavix per her discharge instructions after her procedure.

## 2018-03-15 NOTE — Patient Instructions (Signed)
You have been scheduled for a colonoscopy. Please follow written instructions given to you at your visit today.  Please pick up your prep supplies at the pharmacy within the next 1-3 days. If you use inhalers (even only as needed), please bring them with you on the day of your procedure. Your physician has requested that you go to www.startemmi.com and enter the access code given to you at your visit today. This web site gives a general overview about your procedure. However, you should still follow specific instructions given to you by our office regarding your preparation for the procedure.   A high fiber diet with plenty of fluids (up to 8 glasses of water daily) is suggested to relieve these symptoms. A daily fiber supplement can be used to keep bowels regular if needed. We have given you a high fiber diet handout. Please strive to have 25-30 grams of fiber daily.   Start a daily probiotic such as Align once a day for 2 months.

## 2018-03-15 NOTE — Telephone Encounter (Signed)
   Heather May 04/04/1946 [id number]  Dear Dr. Jaynee Eagles:  We have scheduled the above named patient for a colonsocopy procedure. Our records show that she is on anticoagulation therapy.  Please advise as to whether the patient may come off their therapy of Plavix 5 days prior to their procedure which is scheduled for 05-16-18.  Please route your response to Tinnie Gens, CMA or fax response to [phone].  Sincerely,    [facility]

## 2018-03-15 NOTE — Progress Notes (Signed)
Chief Complaint: Abdominal pain, change in bowel habits  HPI:    Heather May is a 72 year old female with a past medical history as listed below including recent stroke started on Plavix in January, who was referred to me by [name], MD for a complaint of abdominal pain and change in bowel habits.      Today, patient presents to clinic accompanied by family member who does assist with her history.  She describes that for the past few years that she has been having episodes 2-3 times a week where she develops a sharp lower abdominal pain which results in urgent bowel movement.  Sometimes this is so urgent that she does not make it to the toilet.  This is typically loose when this happens.  Often related to eating but not always, has not been able to pin it  to a certain food.  In between, patient will have some normal solid bowel movements and pain is typically relieved after the bowel movement.    Does describe a colonoscopy at least 7+ years ago in [place] but unsure of physician.  Does not believe they found any polyps at that time.  We do not have report.    Denies fever, chills, blood in her stool, weight loss, anorexia, nausea, vomiting, heartburn or reflux.  Past Medical History:  Diagnosis Date  . CAD (coronary artery disease) 08/15/13  . Diabetes mellitus without complication ([place])   . Diverticulosis   . Essential tremor   . GERD (gastroesophageal reflux disease)   . History of long-term use of multiple prescription drugs   . Hyperlipidemia    mixed  . Hypertension   . Long-term (current) use of anticoagulants   . PAD (peripheral artery disease) ([place])   . Percutaneous transluminal coronary angioplasty status   . Permanent atrial fibrillation (HCC)    chads2vasc score is at least 5  . Pulmonary embolism ([place])   . Pulmonary fibrosis ([place])   . Seizure ([place]) 2014   occured in setting of [place]  . Sick sinus syndrome ([place])   . Spinal stenosis   . Subarachnoid hemorrhage  ([place])   . Transient ischemic attack     Past Surgical History:  Procedure Laterality Date  . ABDOMINAL HYSTERECTOMY  2004  . BASAL CELL CARCINOMA EXCISION    . CARDIAC CATHETERIZATION  10/14   PCI   . carpel tunnel surgery Bilateral 2006/ 2010  . CESAREAN SECTION N/A 1978   x 1  . HERNIA REPAIR  06/2010  . LEFT ATRIAL APPENDAGE OCCLUSION N/A 08/26/2015   Procedure: LEFT ATRIAL APPENDAGE OCCLUSION;  Surgeon: Thompson Grayer, MD;  Location: [facility];  Service: Cardiovascular;  Laterality: N/A;  . PACEMAKER INSERTION  03/05/15   [name] Accolade DR implanted in [place]  . removal of big toe nail Bilateral   . TEE WITHOUT CARDIOVERSION N/A 08/16/2015   Procedure: TRANSESOPHAGEAL ECHOCARDIOGRAM (TEE);  Surgeon: Pixie Casino, MD;  Location: [facility]COPY;  Service: Cardiovascular;  Laterality: N/A;  . TEE WITHOUT CARDIOVERSION N/A 10/12/2015   Procedure: TRANSESOPHAGEAL ECHOCARDIOGRAM (TEE);  Surgeon: Pixie Casino, MD;  Location: [facility] ENDOSCOPY;  Service: Cardiovascular;  Laterality: N/A;  . titanium pins inserted metatarsol bone  02/2012   2 pins placed     Current Outpatient Medications  Medication Sig Dispense Refill  . benazepril (LOTENSIN) 10 MG tablet Take 10 mg by mouth daily.    . cetirizine (ZYRTEC) 10 MG tablet Take 10 mg by  mouth daily as needed.   5  . clopidogrel (PLAVIX) 75 MG tablet Take 1 tablet (75 mg total) by mouth daily. 30 tablet 11  . diltiazem (CARDIZEM) 30 MG tablet TAKE 1 TABLET(30 MG) BY MOUTH DAILY AS NEEDED 30 tablet 8  . glipiZIDE (GLUCOTROL) 5 MG tablet Take 5 mg by mouth 2 (two) times daily.  3  . levETIRAcetam (KEPPRA) 500 MG tablet Take 500 mg by mouth daily.     [name] lovastatin (MEVACOR) 40 MG tablet Take 40 mg by mouth at bedtime.    . metFORMIN (GLUCOPHAGE) 850 MG tablet Take 850 mg by mouth 2 (two) times daily with a meal.    . Multiple Vitamin (MULTIVITAMIN) tablet Take 1 tablet by mouth daily.    . nitroGLYCERIN  (NITROSTAT) 0.4 MG SL tablet Place 0.4 mg under the tongue every 5 (five) minutes as needed for chest pain.    [name] ondansetron (ZOFRAN) 4 MG tablet Take 4 mg by mouth every 8 (eight) hours as needed for nausea or vomiting.    [name] OVER THE COUNTER MEDICATION Take 1 Dose by mouth daily. OTC Joint Juice "1,500 mg of glucosamine plus chondroitin, Vitamin C and Vitamin D"    . RaNITidine HCl (ZANTAC PO) Take by mouth.     No current facility-administered medications for this visit.     Allergies as of 03/15/2018 - Review Complete 03/15/2018  Allergen Reaction Noted  . Hydrocodone Nausea And Vomiting 12/11/2017  . Sulfa antibiotics  07/07/2015    Family History  Problem Relation Age of Onset  . Stroke Mother   . Epilepsy Mother   . Stroke Father   . Heart attack Father   . Atrial fibrillation Sister   . Atrial fibrillation Brother   . Diabetes Mellitus II Brother   . Pulmonary embolism Son     Social History   Socioeconomic History  . Marital status: Widowed    Spouse name: Not on file  . Number of children: 5  . Years of education: 60  . Highest education level: Not on file  Occupational History  . Occupation: Retired  Scientific laboratory technician  . Financial resource strain: Not on file  . Food insecurity:    Worry: Not on file    Inability: Not on file  . Transportation needs:    Medical: Not on file    Non-medical: Not on file  Tobacco Use  . Smoking status: Former Smoker    Last attempt to quit: 07/06/2009    Years since quitting: 8.6  . Smokeless tobacco: Never Used  . Tobacco comment: didnt smoke much  Substance and Sexual Activity  . Alcohol use: No    Alcohol/week: 0.0 oz    Comment: very infrequently  . Drug use: No  . Sexual activity: Not on file  Lifestyle  . Physical activity:    Days per week: Not on file    Minutes per session: Not on file  . Stress: Not on file  Relationships  . Social connections:    Talks on phone: Not on file    Gets together: Not on file     Attends religious service: Not on file    Active member of club or organization: Not on file    Attends meetings of clubs or organizations: Not on file    Relationship status: Not on file  . Intimate partner violence:    Fear of current or ex partner: Not on file    Emotionally abused: Not on  file    Physically abused: Not on file    Forced sexual activity: Not on file  Other Topics Concern  . Not on file  Social History Narrative   [place] recently moved to [facility] [place].    Previously worked as a Teacher, music.   Caffeine use: only when eating out tea/soda   Right-handed   Lives at home with partner/roommate    Review of Systems:    Constitutional: No weight loss, fever or chills Skin: No rash Cardiovascular: No chest pain Respiratory: No SOB  Gastrointestinal: See HPI and otherwise negative Genitourinary: No dysuria  Neurological: No headache, dizziness or syncope Musculoskeletal: +arthritis Hematologic: No bleeding Psychiatric: No history of depression or anxiety   Physical Exam:  Vital signs: BP 118/70   Pulse 78   Ht 5\' 6"  (1.676 m)   Wt 203 lb 4 oz (92.2 kg)   BMI 32.81 kg/m   Constitutional:   Pleasant Caucasian female appears to be in NAD, Well developed, Well nourished, alert and cooperative Head:  Normocephalic and atraumatic. +facial asymmetry, some residual dropping on right Eyes:   PEERL, EOMI. No icterus. Conjunctiva pink. Ears:  Normal auditory acuity. Neck:  Supple Throat: Oral cavity and pharynx without inflammation, swelling or lesion.  Respiratory: Respirations even and unlabored. Lungs clear to auscultation bilaterally.   No wheezes, crackles, or rhonchi.  Cardiovascular: Normal S1, S2. No MRG. Regular rate and rhythm. No peripheral edema, cyanosis or pallor.  Gastrointestinal:  Soft, nondistended, nontender. No rebound or guarding. Normal bowel sounds. No appreciable masses or hepatomegaly. Rectal:  Not performed.  Msk:   Symmetrical without gross deformities. Without edema, no deformity or joint abnormality.Ambulates with walker  Neurologic:  Alert and  oriented x4;  grossly normal neurologically.  Skin:   Dry and intact without significant lesions or rashes. Psychiatric:  Demonstrates good judgement and reason without abnormal affect or behaviors.  No recent labs or imaging.   Assessment: 1.  Abdominal pain: Sharp pain, results in liquid bowel movement, lower abdomen, relieved afterwards; consider IBS versus other 2.  Change in bowel habits: Times of urgent liquid stool with pain above over the past 2 years, no bleeding; consider IBS versus other 3.  Colorectal cancer screening: Last colonoscopy at least 7-8 years ago interim, does not believe she had polyps, will try to get reports 4.  Chronic anticoagulation: With Plavix, started in January after stroke  Plan: 1.  Discussed with patient that she was recently started on Plavix for stroke in January, it may be that her neurologist would like to wait at least 6 months before allowing her to stop this.  We will contact him.  Ideally would advised the patient to hold her Plavix for 5 days prior to time procedure.  Again we will contact her neurologist to ensure that this is okay for her. 2.  We will try to get patient's previous records, though she is unaware of her specific gastroenterologist.  We will contact her PCP in [place] to see if they have reports. 3.  Recommend patient increase fiber in her diet to at least 25-35 g/day with use of fiber supplement/through her diet. 4.  Also recommend patient start a daily probiotic such as Align.  Did provide her with a coupon today.  Recommend she continue this for at least 2 months. 5.  We did go ahead and schedule patient for a diagnostic colonoscopy at the end of July with Dr. Loletha Carrow today in the [facility].  Discussed risk, benefits, limitations and alternatives and the patient agrees to proceed.  Again, if we need to wait  longer to take patient off anticoagulation we will postpone this.  She wanted to go ahead and schedule today. 6.  Patient will follow in clinic per recommendations from Dr. Loletha Carrow after time of procedure/after review of records above.  Ellouise Newer, PA-C [facility] 03/15/2018, 9:18 AM  Cc: [name], MD

## 2018-03-16 NOTE — Progress Notes (Signed)
Thank you for sending this case to me. I have reviewed the entire note, and the outlined plan seems appropriate.  I would prefer to have her off plavix in case polyps are discovered that require removal.  However, if she cannot be off the plavix by the time of colonoscopy as scheduled, then I will still perform it and can still take biopsies for microscopic colitis.  Wilfrid Lund, MD

## 2018-03-18 ENCOUNTER — Ambulatory Visit (INDEPENDENT_AMBULATORY_CARE_PROVIDER_SITE_OTHER): Payer: Medicare Other | Admitting: Neurology

## 2018-03-18 DIAGNOSIS — Z950 Presence of cardiac pacemaker: Secondary | ICD-10-CM

## 2018-03-18 DIAGNOSIS — G4739 Other sleep apnea: Secondary | ICD-10-CM

## 2018-03-18 DIAGNOSIS — G4731 Primary central sleep apnea: Secondary | ICD-10-CM | POA: Diagnosis not present

## 2018-03-18 DIAGNOSIS — I4821 Permanent atrial fibrillation: Secondary | ICD-10-CM

## 2018-03-18 DIAGNOSIS — I609 Nontraumatic subarachnoid hemorrhage, unspecified: Secondary | ICD-10-CM

## 2018-03-18 DIAGNOSIS — G4719 Other hypersomnia: Secondary | ICD-10-CM

## 2018-03-20 NOTE — Procedures (Signed)
PATIENT'S NAME:  Heather May, Heather May DOB:      June 28, 1946      MR#:    [id number]     DATE OF RECORDING: 03/18/2018 REFERRING M.D.:  Sarina Ill, MD and Rachell Cipro, MD Study Performed:   Titration to positive airway pressure  HISTORY:  This female 72 year old patient of Dr. Cathren Laine with extensive list of diagnoses: (Stroke, CAD, pacemaker for bradycardia, essential tremor, atrial fib, watchman procedure and peripheral vascular disease endorsed a high degree of EDS) returns to [facility] for PAP titration following a SPLIT night study performed on 02/08/2018, which resulted in central/ complex sleep apnea at an AHI of 31.6/h and SpO2 nadir of 90%.This apnea did not respond to CPAP, but SPLIT night did not allow time enough for a complete titration to BiPAP.  The patient endorsed the Epworth Sleepiness Scale at 13/24 points and the Fatigue Score at 17 points.   The patient's weight 205 pounds with a height of 66 (inches), resulting in a BMI of 33.1 kg/m2.  CURRENT MEDICATIONS: Lotensin, Zyrtec, Plavix, Cardizem, Glucotrol, Keppra, Mevacor, Glucophage, Multivitamin, Nitrostat, Zofran, OTC vitamins, Zantac  PROCEDURE:  This is a multichannel digital polysomnogram utilizing the SomnoStar 11.2 system.  Electrodes and sensors were applied and monitored per AASM Specifications.   EEG, EOG, Chin and Limb EMG, were sampled at 200 Hz.  ECG, Snore and Nasal Pressure, Thermal Airflow, Respiratory Effort, CPAP Flow and Pressure, Oximetry was sampled at 50 Hz. Digital video and audio were recorded.      BiPAP was initiated at 10/6 cmH20 with heated humidity per AASM split night standards and pressure was advanced to BiPAP ST 10/6 cm /12 respiratory rate and 9/5 cm/12 RR.  The AHI remained at 15.5 and 18.9/h. The technologist changed to ASV mode- 5/15/3 cmH20 because of hypopneas, central apneas and achieved a reduction of the AHI to 0.5 with improvement of sleep apnea. A trial of 15/6/3 did not show any  additional benefit. Lights Out was at 22:49 and Lights On at 05:14. Total recording time (TRT) was 386 minutes, with a total sleep time (TST) of 304.5 minutes. The patient's sleep latency was 5.5 minutes. REM latency was 128 minutes.  The sleep efficiency was improved over the baseline study at 78.9 %.    SLEEP ARCHITECTURE: WASO (Wake after sleep onset) was 75.5 minutes.  There were 44 minutes in Stage N1, 138 minutes Stage N2, 59 minutes Stage N3 and 63.5 minutes in Stage REM.  The percentage of Stage N1 was 14.4%, Stage N2 was 45.3%, Stage N3 was 19.4% and Stage R (REM sleep) was 20.9%.   RESPIRATORY ANALYSIS:  There was a total of 35 respiratory events: 0 obstructive apneas, 35 central apneas and 0 mixed apneas with 0 hypopneas. The patient also had 0 respiratory event related arousals (RERAs). The total APNEA/HYPOPNEA INDEX (AHI) was 6.9 /hour and the total RESPIRATORY DISTURBANCE INDEX was 6.9 /hour  1 event occurred in REM sleep and 34 events in NREM. The REM AHI was 0.9 /hour versus a non-REM AHI of 8.5 /hour.  The patient spent 240 minutes of total sleep time in the supine position and 65 minutes in non-supine. The supine AHI was 7.5/h, versus a non-supine AHI of 4.7/h.  OXYGEN SATURATION & C02:  The baseline 02 saturation was 97%, with the lowest being 91%.  PERIODIC LIMB MOVEMENTS:  The patient had a total of 0 Periodic Limb Movements.  The arousals were noted as: 48 were spontaneous, 0  were associated with PLMs, and 19 were associated with respiratory events. Audio and video analysis did not show any abnormal or unusual movements, behaviors, phonations or vocalizations.  The patient took one bathroom break. Snoring was no longer noted but oral air venting and resulting rattle of the mask made use of a FFM necessary. EKG was paced.  Post-study, the patient indicated that sleep was worse than usual, she woke with a dry mouth.  The patient was fitted with a F&P Simplus small FF  Mask.  DIAGNOSIS 1. Treatment emerging Central Sleep Apnea - not responding to CPAP and BiPAP but to ASV. 2. No findings of associate hypoxemia, PLMs, but frequent arousals spontaneously. This indicates some need to get used to the device.    PLANS/RECOMMENDATIONS: The patient's apnea responded well to ASV, but her overall sleep efficiency is still reduced. There are frequent spontaneous arousals.  I will order the recommended machine and pressure settings, and will offer the patient a sleep aid for the first month of use.   A [employer] Simplus small size FFM was used. Will order ASV machine with settings of - 5/15/3 cmH20, meaning EPAP at 5 cm , Maximum pressure support at 15 cm, and Minimum pressure support at 3 cm water.   A follow up appointment will be scheduled in the Sleep Clinic at [facility].   Please call [phone] with any questions.     I certify that I have reviewed the entire raw data recording prior to the issuance of this report in accordance with the Standards of Accreditation of the American Academy of Sleep Medicine (AASM)   Larey Seat, M.D.  03-20-2018  Diplomat, American Board of Psychiatry and Neurology  Diplomat, [place] [facility] Medical Director, [place] [facility]

## 2018-03-20 NOTE — Addendum Note (Signed)
Addended by: Larey Seat on: 03/20/2018 12:55 PM   Modules accepted: Orders

## 2018-03-21 ENCOUNTER — Telehealth: Payer: Self-pay | Admitting: Neurology

## 2018-03-21 NOTE — Telephone Encounter (Signed)
Pt called back. I advised pt that Dr. Brett Fairy reviewed their sleep study results and found that pt has sleep apnea that was treated with ASV. Dr. Brett Fairy recommends that pt should start treatment with ASV. I reviewed PAP compliance expectations with the pt. Pt is agreeable to starting a CPAP. I advised pt that an order will be sent to a DME, [facility], and [facility] will call the pt within about one week after they file with the pt's insurance. [facility] will show the pt how to use the machine, fit for masks, and troubleshoot the CPAP if needed. A follow up appt was made for insurance purposes with Dr. Brett Fairy on Aug 29,2019 at 8:30 am. Pt verbalized understanding to arrive 15 minutes early and bring their CPAP. A letter with all of this information in it will be mailed to the pt as a reminder. I verified with the pt that the address we have on file is correct. Pt verbalized understanding of results. Pt had no questions at this time but was encouraged to call back if questions arise.

## 2018-03-21 NOTE — Telephone Encounter (Signed)
-----   Message from Larey Seat, MD sent at 03/20/2018 12:55 PM EDT ----- DIAGNOSIS 1. Treatment emerging Central Sleep Apnea - not responding to CPAP and BiPAP, but responsive to ASV mode.  2. No findings of associate hypoxemia, PLMs, but frequent arousals spontaneously. This indicates some need to get used to the device.    PLANS/RECOMMENDATIONS: The patient's apnea responded well to ASV, but her overall sleep efficiency is still reduced. There are frequent spontaneous arousals.  I will order the recommended machine and pressure settings, and will offer the patient a sleep aid for the first month of use.   A [employer] Simplus small size FFM was used. Will order ASV machine with settings of - 5/15/3 cmH20, meaning EPAP at 5 cm , Maximum pressure support at 15 cm, and Minimum pressure support at 3 cm water.   Cc Dr Ernie Hew, please

## 2018-03-21 NOTE — Telephone Encounter (Signed)
Called patient to discuss sleep study results. No answer at this time. LVM for the patient to call back.   

## 2018-03-25 DIAGNOSIS — E1165 Type 2 diabetes mellitus with hyperglycemia: Secondary | ICD-10-CM | POA: Diagnosis not present

## 2018-03-25 DIAGNOSIS — Z6832 Body mass index (BMI) 32.0-32.9, adult: Secondary | ICD-10-CM | POA: Diagnosis not present

## 2018-03-25 DIAGNOSIS — J069 Acute upper respiratory infection, unspecified: Secondary | ICD-10-CM | POA: Diagnosis not present

## 2018-03-25 DIAGNOSIS — B354 Tinea corporis: Secondary | ICD-10-CM | POA: Diagnosis not present

## 2018-04-23 ENCOUNTER — Ambulatory Visit (INDEPENDENT_AMBULATORY_CARE_PROVIDER_SITE_OTHER): Payer: Medicare Other | Admitting: *Deleted

## 2018-04-23 DIAGNOSIS — I482 Chronic atrial fibrillation: Secondary | ICD-10-CM

## 2018-04-23 DIAGNOSIS — I4821 Permanent atrial fibrillation: Secondary | ICD-10-CM

## 2018-04-23 NOTE — Progress Notes (Signed)
Remote pacemaker transmission.   

## 2018-04-29 ENCOUNTER — Telehealth: Payer: Self-pay | Admitting: *Deleted

## 2018-04-29 ENCOUNTER — Ambulatory Visit: Payer: Medicare Other | Admitting: Neurology

## 2018-04-29 NOTE — Telephone Encounter (Signed)
Tried to reach pt again and LVM regarding need to cancel appt today.

## 2018-04-29 NOTE — Telephone Encounter (Signed)
Spoke with pt & offered appt this Friday or next Fri 7/19.  R/s to this Friday 7/12 @ 9:30 arrival time 9:00. She verbalized appreciation.

## 2018-04-29 NOTE — Telephone Encounter (Signed)
Called pt & LVM (ok per DPR) informing pt that appt for this 9:30 today will needed to be canceled d/t MD out of office. Will call pt back for r/s.

## 2018-05-03 ENCOUNTER — Encounter: Payer: Self-pay | Admitting: Neurology

## 2018-05-03 ENCOUNTER — Ambulatory Visit (INDEPENDENT_AMBULATORY_CARE_PROVIDER_SITE_OTHER): Payer: Medicare Other | Admitting: Neurology

## 2018-05-03 VITALS — BP 124/72 | HR 66 | Ht 66.0 in | Wt 203.0 lb

## 2018-05-03 DIAGNOSIS — I480 Paroxysmal atrial fibrillation: Secondary | ICD-10-CM

## 2018-05-03 DIAGNOSIS — G4733 Obstructive sleep apnea (adult) (pediatric): Secondary | ICD-10-CM

## 2018-05-03 DIAGNOSIS — I6381 Other cerebral infarction due to occlusion or stenosis of small artery: Secondary | ICD-10-CM

## 2018-05-03 DIAGNOSIS — E7849 Other hyperlipidemia: Secondary | ICD-10-CM | POA: Diagnosis not present

## 2018-05-03 DIAGNOSIS — I10 Essential (primary) hypertension: Secondary | ICD-10-CM | POA: Diagnosis not present

## 2018-05-03 DIAGNOSIS — R569 Unspecified convulsions: Secondary | ICD-10-CM

## 2018-05-03 DIAGNOSIS — G20 Parkinson's disease: Secondary | ICD-10-CM

## 2018-05-03 DIAGNOSIS — G250 Essential tremor: Secondary | ICD-10-CM

## 2018-05-03 DIAGNOSIS — E119 Type 2 diabetes mellitus without complications: Secondary | ICD-10-CM

## 2018-05-03 NOTE — Progress Notes (Signed)
[facility]    Provider:  Dr Jaynee Eagles Referring Provider: [name], MD Primary Care Physician:  [name], MD  CC:  Tremors, stroke, seizures, neuropathy  - New stroke on CT scan  Interval history 05/03/2018: Since last being seen she has been diagnosed with sleep apnea and is being treated for ASV. She needs a new mask, she needs to drive it over there, it has helped a lot, she went to a movie and didn't fall asleep.  Reviewed cpap report with Heather May.  Her mask is leaking, last 30 day report shows compliance, discussed placement of mask the leak is not often maybe 6 out of 30 days. Her apnea is well controlled on ASV machine AHI 5 may need to make adjustments to pressure scheduled follow up with Dr. Brett Fairy today and reported to NP Myriam Jacobson who will discuss with Dr. Brett Fairy. Initiial AHI in deep sleep 30x a hour and otherwise 16x/hr and O2 dropping to nadir of 60%. She is doing well.  No seizures since stopping Keppra (was on after SDH).   Interval history 12/27/2017: Heather May is her for a new problem, new stroke found on CT head.  Appears to be small-vessel disease due to location and size - however cannot rule out afib. Will send notes to Dr. Joylene Grapes and would not rule out starting Eliquis in the future. Had a long discussion with Heather May and her partner, reviewed images and answered questions.  CT 12/22/2017: Abnormal CT scan of the head showing remote age right internal capsule infarct and mild age-appropriate changes of chronic microvascular ischemia and generalized cerebral atrophy.  Compared to the CT head dated 06/30/2016 the right internal capsule infarct appears to be new  Interval history 12/11/2016: Heather May has a new issue today likely sleep apnea, cardiologist stated she should have a sleep eval. 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  Heather May has a past  medical history of atrial fibrillation, CAD s/p stents,  bradycardia s/p pacemaker, type 2 diabetes, hyperlipidemia, essential tremor, cerebral infarction, hypertension, peripheral vascular disease, spinal stenosis, pain in limb, seizure. ESS 12. She is still on the keppra. Discussed discontinuing. She is excessively tired, no "ambition", she sits in her recliner all day and nods off, tired all the time, snoring.   6 weeks ago she was watching her grandchildren, she had just awakened from sleep, she felt all "floppy" and she sat inher recliner all day, a lot more fatigue, she thought the right side of her mouth was drooping for a few weeks but no one else saw it and nothing else focal she just felt fatigues for a few weeks. 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 CT head to eval for any new strokes.   Interval history 12/18/2016: Heather May is a 72 y.o. May here as a referral from Dr. Tamala Julian for multiple neurologic complaints. Today we will address a new issue of imbalance and falls. 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  Heather May has a past medical history of atrial fibrillation, CAD s/p stents,  bradycardia s/p pacemaker, type 2 diabetes, hyperlipidemia, essential tremor, cerebral infarction, hypertension, peripheral vascular disease, spinal stenosis, pain in limb, seizure.She walks, go to the gym, go to movies, socially active. CT of the head She nods off a lot during the day and naps. She wakes at night  frequently. But ESS only 5/10. She has dry mouth in the morning and difficulty swallowing. Occ wakes herself up snoring. Discussed CT head and EEG unremarkable and stopping keppra, cannot be certain she will not have another seizure. Heather May will think about it.   HPI:  Heather May is a 72 y.o. May here as a referral from Dr. Tamala Julian for multiple neurologic complaints. Heather May has a past medical history of atrial fibrillation, CAD s/p  stents,  bradycardia s/p pacemaker, type 2 diabetes, hyperlipidemia, essential tremor, cerebral infarction, hypertension, peripheral vascular disease, spinal stenosis, pain in limb,. She is on aspirin 325 and Keppra. Also on lovastatin. She had a watchman device placed in 08/26/15 by Dr Rayann Heman. She follows with [facility]. She moved from [place] a year and here to establish new care. She had a SAH, she had LOC and neightbor found her laying in the driveway. No one witnessed her fall or a seizure but  there was vomit and blood on the driveway, occurred in 2014 without inciting event or new medications or previous illness. Her partner is here and provides information. She went ot the hospital and had a witnessed seizure as well. For 3 minutes. She has been on keppra since then without any events. Elliot Dally are mostly in the head. She was initially diagnosed with parkinson's disease but later was changed to essential tremor. No resting hand tremor, no hypophonia or dec sense of smell or other symptoms of PD as she went to support groups and did not think she was like the PD patients. She has a head bob. She has a tremor for years in 2014 slowly worsening not significantly bothering her. Tremor started in 2014 after falling out of her wheelchair and hitting her head. Stable. She does water yoga, silver sneakers once a week, walks the dogs with partner. She uses a walker for stability. No FHx of tremors.   Reviewed notes, labs and imaging from outside physicians, which showed: Per current pcp notes, Heather May has stroke and tremor and wants to get off seizure meds per primary care. Per notes she has unspecified convulsions. She is on Keppra 500 mg twice a day. She is also on aspirin and statin. Heather May had a hemorrhagic stroke with subsequent seizure and she has not had an episode since then and would like to know if she should continue medication. She's had a resting tremor for a number of years and was diagnosed  with Parkinson's. Stress can make her head shaky. Notes also make reference to benign essential tremor.Per notes from [facility] 2017, she has diabetes that has been uncontrolled in the past, at one point she could not afford a glucometer, no eye exam in 12 years, A1c high, recently started on Metformin, continued on glipizide.  Last hgba1c 8.2 10/2015,   Sodium 140 135 - 146 MMOL/L  Potassium 4.4 3.5 - 5.3 MMOL/L  Chloride 102 98 - 110 MMOL/L  CO2 29 21 - 33 MMOL/L  BUN 17 8 - 24 MG/DL  Glucose 226 (H) 70 - 99 MG/DL  Creatinine 0.77 0.50 - 1.50 MG/DL  Calcium 9.8 8.5 - 10.5 MG/DL  Total Protein 7.5 6.0 - 8.3 G/DL  Albumin 4.3 3.5 - 5.0 G/DL  Total Bilirubin 0.6 0.1 - 1.2 MG/DL  Alkaline Phosphatase 120 25 - 125 IU/L  AST (SGOT) 36 5 - 40 IU/L  ALT (SGPT) 16 5 - 50 IU/L  Anion Gap 9 4 - 14  Est. GFR Non-Black >60  Est. GFR Black >  60    CT of the head in 2007 at [facility] per report: Findings:  There is no evidence for mass effect, midline shift, or extra-axial fluid collections.There is no evidence for space-occupying lesion or intracranial hemorrhage. There is no evidence for acute cortical-based area of infarction.Ventricles and sulci are appropriate for the Heather May's age. The basal cisterns are patent. No fractures. There may be minimal soft tissue swelling over the left supraorbital region.  Visualized portions of the orbits are unremarkable. The visualized paranasal sinuses and mastoids are unremarkable.  Impression: 1. No acute intracranial process.  Review of Systems: Heather May complains of symptoms per HPI as well as the following symptoms: daytime sleepiness, joint pain, ringing in the ears. Pertinent negatives per HPI. All others negative.  Social History   Socioeconomic History  . Marital status: Widowed    Spouse name: Not on file  . Number of children: 5  . Years of education: 51  . Highest education level: Not on file  Occupational History  . Occupation:  Retired  Scientific laboratory technician  . Financial resource strain: Not on file  . Food insecurity:    Worry: Not on file    Inability: Not on file  . Transportation needs:    Medical: Not on file    Non-medical: Not on file  Tobacco Use  . Smoking status: Former Smoker    Last attempt to quit: 07/06/2009    Years since quitting: 8.8  . Smokeless tobacco: Never Used  . Tobacco comment: didnt smoke much  Substance and Sexual Activity  . Alcohol use: No    Alcohol/week: 0.0 oz    Comment: very infrequently  . Drug use: No  . Sexual activity: Not on file  Lifestyle  . Physical activity:    Days per week: Not on file    Minutes per session: Not on file  . Stress: Not on file  Relationships  . Social connections:    Talks on phone: Not on file    Gets together: Not on file    Attends religious service: Not on file    Active member of club or organization: Not on file    Attends meetings of clubs or organizations: Not on file    Relationship status: Not on file  . Intimate partner violence:    Fear of current or ex partner: Not on file    Emotionally abused: Not on file    Physically abused: Not on file    Forced sexual activity: Not on file  Other Topics Concern  . Not on file  Social History Narrative   Pt recently moved to [facility] [place].    Previously worked as a Teacher, music.   Caffeine use: only when eating out tea/soda   Right-handed   Lives at home with partner/roommate    Family History  Problem Relation Age of Onset  . Stroke Mother   . Epilepsy Mother   . Stroke Father   . Heart attack Father   . Atrial fibrillation Sister   . Atrial fibrillation Brother   . Diabetes Mellitus II Brother   . Pulmonary embolism Son     Past Medical History:  Diagnosis Date  . CAD (coronary artery disease) 08/15/13  . Diabetes mellitus without complication ([place])   . Diverticulosis   . Essential tremor   . GERD (gastroesophageal reflux disease)   . History of  long-term use of multiple prescription drugs   . Hyperlipidemia    mixed  .  Hypertension   . Long-term (current) use of anticoagulants   . PAD (peripheral artery disease) ([place])   . Percutaneous transluminal coronary angioplasty status   . Permanent atrial fibrillation (HCC)    chads2vasc score is at least 5  . Pulmonary embolism ([place])   . Pulmonary fibrosis ([place])   . Seizure ([place]) 2014   occured in setting of [place]  . Sick sinus syndrome ([place])   . Spinal stenosis   . Subarachnoid hemorrhage ([place])   . Transient ischemic attack     Past Surgical History:  Procedure Laterality Date  . ABDOMINAL HYSTERECTOMY  2004  . BASAL CELL CARCINOMA EXCISION    . CARDIAC CATHETERIZATION  10/14   PCI   . carpel tunnel surgery Bilateral 2006/ 2010  . CESAREAN SECTION N/A 1978   x 1  . HERNIA REPAIR  06/2010  . LEFT ATRIAL APPENDAGE OCCLUSION N/A 08/26/2015   Procedure: LEFT ATRIAL APPENDAGE OCCLUSION;  Surgeon: Thompson Grayer, MD;  Location: [facility];  Service: Cardiovascular;  Laterality: N/A;  . PACEMAKER INSERTION  03/05/15   [name] Accolade DR implanted in [place]  . removal of big toe nail Bilateral   . TEE WITHOUT CARDIOVERSION N/A 08/16/2015   Procedure: TRANSESOPHAGEAL ECHOCARDIOGRAM (TEE);  Surgeon: Pixie Casino, MD;  [facility];  Service: Cardiovascular;  Laterality: N/A;  . TEE WITHOUT CARDIOVERSION N/A 10/12/2015   Procedure: TRANSESOPHAGEAL ECHOCARDIOGRAM (TEE);  Surgeon: Pixie Casino, MD;  Location: [facility] ENDOSCOPY;  Service: Cardiovascular;  Laterality: N/A;  . titanium pins inserted metatarsol bone  02/2012   2 pins placed     Current Outpatient Medications  Medication Sig Dispense Refill  . benazepril (LOTENSIN) 10 MG tablet Take 10 mg by mouth daily.    . cetirizine (ZYRTEC) 10 MG tablet Take 10 mg by mouth daily as needed.   5  . clopidogrel (PLAVIX) 75 MG tablet Take 1 tablet (75 mg total) by mouth daily. 30 tablet 11  .  diltiazem (CARDIZEM) 30 MG tablet TAKE 1 TABLET(30 MG) BY MOUTH DAILY AS NEEDED 30 tablet 8  . glipiZIDE (GLUCOTROL) 5 MG tablet Take 5 mg by mouth 2 (two) times daily.  3  . levETIRAcetam (KEPPRA) 500 MG tablet Take 500 mg by mouth daily.     [name] lovastatin (MEVACOR) 40 MG tablet Take 40 mg by mouth at bedtime.    . metFORMIN (GLUCOPHAGE) 1000 MG tablet Take 1,000 mg by mouth 2 (two) times daily with a meal. Take 1500mg   In the am and 1000 mg in afternoon    . Multiple Vitamin (MULTIVITAMIN) tablet Take 1 tablet by mouth daily.    . ondansetron (ZOFRAN) 4 MG tablet Take 4 mg by mouth every 8 (eight) hours as needed for nausea or vomiting.    [name] OVER THE COUNTER MEDICATION Take 1 Dose by mouth daily. OTC Joint Juice "1,500 mg of glucosamine plus chondroitin, Vitamin C and Vitamin D"    . RaNITidine HCl (ZANTAC PO) Take by mouth.     No current facility-administered medications for this visit.     Allergies as of 05/03/2018 - Review Complete 05/03/2018  Allergen Reaction Noted  . Hydrocodone Nausea And Vomiting 12/11/2017  . Sulfa antibiotics  07/07/2015    Vitals: BP 124/72   Pulse 66   Ht 5\' 6"  (1.676 m)   Wt 203 lb (92.1 kg)   BMI 32.77 kg/m  Last Weight:  Wt Readings from Last 1 Encounters:  05/03/18 203  lb (92.1 kg)   Last Height:   Ht Readings from Last 1 Encounters:  05/03/18 5\' 6"  (1.676 m)     Physical exam: Exam: Gen: NAD, conversant, well nourised, obese, well groomed                     CV: RRR, no MRG. No Carotid Bruits. No peripheral edema, warm, nontender Eyes: Conjunctivae clear without exudates or hemorrhage  Neuro: Detailed Neurologic Exam  Speech:    Speech is normal; fluent and spontaneous with normal comprehension.  Cognition:    The Heather May is oriented to person, place, and time;     recent and remote memory intact;     language fluent;     normal attention, concentration,     fund of knowledge Cranial Nerves:    The pupils are equal, round,  and reactive to light.Attempted but could not visualize fundi due to small pupils.  Visual fields are full to finger confrontation. Extraocular movements are intact. Trigeminal sensation is intact and the muscles of mastication are normal. Right lid assymmetry(chronic for 50 years and stable) right ptosis and right NL flattening. The palate elevates in the midline. Hearing intact. Voice is tremulous as well. Shoulder shrug is normal. The tongue has normal motion without fasciculations.   Coordination:    No dysmetria  Gait: difficulty getting out of seat. Stooped due to stenosis, antalgic due to knee and back pain.   Motor Observation: Head bobbing, bilateral leg tremor, no significant postural or resting hand tremor some with intention on FTN     Tone:    Normal muscle tone.  No cogwheeling.   Posture:    Posture is stooped    Strength:    Strength is V/V in the upper and lower limbs.      Sensation: decreased pp and temp distally in the feet, few seconds vibrtion  intact prroprioception.      Reflex Exam:  DTR's: upper right 2+ and 1+ upper left, hypo patellatrs absent AJs.      Toes:    The toes are equivocal bilaterally.   Clonus:    Clonus is absent.     Assessment/Plan:  72 y.o. May here as a referral from Dr. Tamala Julian for multiple neurologic complaints. Heather May has a complicated past medical history of atrial fibrillation, watchman device, hemorrhage while on coumadin, CAD s/p stents,  bradycardia s/p pacemaker, type 2 diabetes, hyperlipidemia, essential tremor, cerebral infarction, hypertension, peripheral vascular disease, spinal stenosis, pain in limb.  - new stroke found on CT head at last appointment.  Appears to be small-vessel disease due to location and size - however cannot rule out afib. Sent notes to Dr. Joylene Grapes and would not rule out starting Eliquis in the future. Changed Aspirin 325mg  to Plavix. I had a long d/w Heather May about her recent stroke, risk for  recurrent stroke/TIAs, personally independently reviewed imaging studies and stroke evaluation results and answered questions. Start Plavix for secondary stroke prevention and maintain strict control of hypertension with blood pressure goal below 130/90, diabetes with hemoglobin A1c goal below 6.5% and lipids with LDL cholesterol goal below 70 mg/dL I also advised the Heather May to eat a healthy diet with plenty of whole grains, cereals, fruits and vegetables, exercise regularly and maintain ideal body weight . Continue Plavix.  Had a long discussion about seizures at last appointment. Seizure happened due to a reported [place] in 2014. She has been free of any events since then : CT of  the head was normal for age, EEG normal. Stopped Keppra and no seizures.  Sleep eval: OSA on treatment, discussed today see HPI, improved  She appears to have essential tremor, she is not bothered by it, do not recommend any treatment at this time, monitor clinically. Tremor is stable, still not bothering her.   She has peripheral neuropathy from diabetes, not significantly causing her pain, stable  Follow with cardiology for afib. She understands she has an increased risk of stroke with afib.[name] She has a Multimedia programmer.   Cc: Dr. Ernie Hew, Dr. Milus Glazier, MD  [facility] [address] [place], [place] [address]  Phone [phone] Fax [phone]  A total of 25 minutes was spent face-to-face with this Heather May. Over half this time was spent on counseling Heather May on the  1. OSA (obstructive sleep apnea)   2. Paroxysmal atrial fibrillation (HCC)   3. Essential hypertension   4. Diabetes mellitus without complication ([place])   5. Parkinson's disease ([place])   6. Essential tremor   7. Seizure ([place])   8. Other hyperlipidemia    diagnoses and different diagnostic and therapeutic options available.

## 2018-05-03 NOTE — Patient Instructions (Addendum)
Sleep Apnea Sleep apnea is a condition in which breathing pauses or becomes shallow during sleep. Episodes of sleep apnea usually last 10 seconds or longer, and they may occur as many as 20 times an hour. Sleep apnea disrupts your sleep and keeps your body from getting the rest that it needs. This condition can increase your risk of certain health problems, including:  Heart attack.  Stroke.  Obesity.  Diabetes.  Heart failure.  Irregular heartbeat.  There are three kinds of sleep apnea:  Obstructive sleep apnea. This kind is caused by a blocked or collapsed airway.  Central sleep apnea. This kind happens when the part of the brain that controls breathing does not send the correct signals to the muscles that control breathing.  Mixed sleep apnea. This is a combination of obstructive and central sleep apnea.  What are the causes? The most common cause of this condition is a collapsed or blocked airway. An airway can collapse or become blocked if:  Your throat muscles are abnormally relaxed.  Your tongue and tonsils are larger than normal.  You are overweight.  Your airway is smaller than normal.  What increases the risk? This condition is more likely to develop in people who:  Are overweight.  Smoke.  Have a smaller than normal airway.  Are elderly.  Are female.  Drink alcohol.  Take sedatives or tranquilizers.  Have a family history of sleep apnea.  What are the signs or symptoms? Symptoms of this condition include:  Trouble staying asleep.  Daytime sleepiness and tiredness.  Irritability.  Loud snoring.  Morning headaches.  Trouble concentrating.  Forgetfulness.  Decreased interest in sex.  Unexplained sleepiness.  Mood swings.  Personality changes.  Feelings of depression.  Waking up often during the night to urinate.  Dry mouth.  Sore throat.  How is this diagnosed? This condition may be diagnosed with:  A medical history.  A  physical exam.  A series of tests that are done while you are sleeping (sleep study). These tests are usually done in a sleep lab, but they may also be done at home.  How is this treated? Treatment for this condition aims to restore normal breathing and to ease symptoms during sleep. It may involve managing health issues that can affect breathing, such as high blood pressure or obesity. Treatment may include:  Sleeping on your side.  Using a decongestant if you have nasal congestion.  Avoiding the use of depressants, including alcohol, sedatives, and narcotics.  Losing weight if you are overweight.  Making changes to your diet.  Quitting smoking.  Using a device to open your airway while you sleep, such as: ? An oral appliance. This is a custom-made mouthpiece that shifts your lower jaw forward. ? A continuous positive airway pressure (CPAP) device. This device delivers oxygen to your airway through a mask. ? A nasal expiratory positive airway pressure (EPAP) device. This device has valves that you put into each nostril. ? A bi-level positive airway pressure (BPAP) device. This device delivers oxygen to your airway through a mask.  Surgery if other treatments do not work. During surgery, excess tissue is removed to create a wider airway.  It is important to get treatment for sleep apnea. Without treatment, this condition can lead to:  High blood pressure.  Coronary artery disease.  (Men) An inability to achieve or maintain an erection (impotence).  Reduced thinking abilities.  Follow these instructions at home:  Make any lifestyle changes that your health   care provider recommends.  Eat a healthy, well-balanced diet.  Take over-the-counter and prescription medicines only as told by your health care provider.  Avoid using depressants, including alcohol, sedatives, and narcotics.  Take steps to lose weight if you are overweight.  If you were given a device to open your  airway while you sleep, use it only as told by your health care provider.  Do not use any tobacco products, such as cigarettes, chewing tobacco, and e-cigarettes. If you need help quitting, ask your health care provider.  Keep all follow-up visits as told by your health care provider. This is important. Contact a health care provider if:  The device that you received to open your airway during sleep is uncomfortable or does not seem to be working.  Your symptoms do not improve.  Your symptoms get worse. Get help right away if:  You develop chest pain.  You develop shortness of breath.  You develop discomfort in your back, arms, or stomach.  You have trouble speaking.  You have weakness on one side of your body.  You have drooping in your face. These symptoms may represent a serious problem that is an emergency. Do not wait to see if the symptoms will go away. Get medical help right away. Call your local emergency services (911 in the [place] Do not drive yourself to the hospital. This information is not intended to replace advice given to you by your health care provider. Make sure you discuss any questions you have with your health care provider. Document Released: 09/29/2002 Document Revised: 06/04/2016 Document Reviewed: 07/19/2015 Elsevier Interactive Patient Education  2018 [employer].   Stroke Prevention Some health problems and behaviors may make it more likely for you to have a stroke. Below are ways to lessen your risk of having a stroke.  Be active for at least 30 minutes on most or all days.  Do not smoke. Try not to be around others who smoke.  Do not drink too much alcohol. ? Do not have more than 2 drinks a day if you are a man. ? Do not have more than 1 drink a day if you are a woman and are not pregnant.  Eat healthy foods, such as fruits and vegetables. If you were put on a specific diet, follow the diet as told.  Keep your cholesterol levels under  control through diet and medicines. Look for foods that are low in saturated fat, trans fat, cholesterol, and are high in fiber.  If you have diabetes, follow all diet plans and take your medicine as told.  Ask your doctor if you need treatment to lower your blood pressure. If you have high blood pressure (hypertension), follow all diet plans and take your medicine as told by your doctor.  If you are 38-39 years old, have your blood pressure checked every 3-5 years. If you are age 7 or older, have your blood pressure checked every year.  Keep a healthy weight. Eat foods that are low in calories, salt, saturated fat, trans fat, and cholesterol.  Do not take drugs.  Avoid birth control pills, if this applies. Talk to your doctor about the risks of taking birth control pills.  Talk to your doctor if you have sleep problems (sleep apnea).  Take all medicine as told by your doctor. ? You may be told to take aspirin or blood thinner medicine. Take this medicine as told by your doctor. ? Understand your medicine instructions.  Make sure any  other conditions you have are being taken care of.  Get help right away if:  You suddenly lose feeling (you feel numb) or have weakness in your face, arm, or leg.  Your face or eyelid hangs down to one side.  You suddenly feel confused.  You have trouble talking (aphasia) or understanding what people are saying.  You suddenly have trouble seeing in one or both eyes.  You suddenly have trouble walking.  You are dizzy.  You lose your balance or your movements are clumsy (uncoordinated).  You suddenly have a very bad headache and you do not know the cause.  You have new chest pain.  Your heart feels like it is fluttering or skipping a beat (irregular heartbeat). Do not wait to see if the symptoms above go away. Get help right away. Call your local emergency services (911 in [place] Do not drive yourself to the hospital. This information is not  intended to replace advice given to you by your health care provider. Make sure you discuss any questions you have with your health care provider. Document Released: 04/09/2012 Document Revised: 03/16/2016 Document Reviewed: 04/11/2013 Elsevier Interactive Patient Education  [employer].

## 2018-05-10 ENCOUNTER — Telehealth: Payer: Self-pay | Admitting: Physician Assistant

## 2018-05-10 NOTE — Telephone Encounter (Signed)
I informed the pt of the conversation with [facility] and had the pt verbalize the information regarding plavix hold and starting ASA 81 mg   Wyline Beady, [place]       03/15/18 4:19 PM  Note    Spoke to patient and informed her to hold Plavix 5 days before procedure and to take ASA 81 mg in the interim. She understands the risk of stroke while holding Plavix. She understands to resume Plavix per her discharge instructions after her procedure.

## 2018-05-15 ENCOUNTER — Encounter: Payer: Self-pay | Admitting: Gastroenterology

## 2018-05-15 ENCOUNTER — Ambulatory Visit (AMBULATORY_SURGERY_CENTER): Payer: Medicare Other | Admitting: Gastroenterology

## 2018-05-15 VITALS — BP 133/84 | HR 62 | Temp 98.0°F | Resp 14 | Ht 66.0 in | Wt 203.0 lb

## 2018-05-15 DIAGNOSIS — D128 Benign neoplasm of rectum: Secondary | ICD-10-CM | POA: Diagnosis not present

## 2018-05-15 DIAGNOSIS — D124 Benign neoplasm of descending colon: Secondary | ICD-10-CM | POA: Diagnosis not present

## 2018-05-15 DIAGNOSIS — I2510 Atherosclerotic heart disease of native coronary artery without angina pectoris: Secondary | ICD-10-CM | POA: Diagnosis not present

## 2018-05-15 DIAGNOSIS — D123 Benign neoplasm of transverse colon: Secondary | ICD-10-CM

## 2018-05-15 DIAGNOSIS — D122 Benign neoplasm of ascending colon: Secondary | ICD-10-CM | POA: Diagnosis not present

## 2018-05-15 DIAGNOSIS — K529 Noninfective gastroenteritis and colitis, unspecified: Secondary | ICD-10-CM | POA: Diagnosis not present

## 2018-05-15 DIAGNOSIS — G4733 Obstructive sleep apnea (adult) (pediatric): Secondary | ICD-10-CM | POA: Diagnosis not present

## 2018-05-15 DIAGNOSIS — D120 Benign neoplasm of cecum: Secondary | ICD-10-CM

## 2018-05-15 DIAGNOSIS — R1030 Lower abdominal pain, unspecified: Secondary | ICD-10-CM | POA: Diagnosis not present

## 2018-05-15 DIAGNOSIS — K635 Polyp of colon: Secondary | ICD-10-CM | POA: Diagnosis not present

## 2018-05-15 DIAGNOSIS — R194 Change in bowel habit: Secondary | ICD-10-CM | POA: Diagnosis not present

## 2018-05-15 DIAGNOSIS — I4891 Unspecified atrial fibrillation: Secondary | ICD-10-CM | POA: Diagnosis not present

## 2018-05-15 DIAGNOSIS — R109 Unspecified abdominal pain: Secondary | ICD-10-CM | POA: Diagnosis not present

## 2018-05-15 MED ORDER — SODIUM CHLORIDE 0.9 % IV SOLN: 500.0000 mL | Freq: Once | INTRAVENOUS | Status: DC

## 2018-05-15 NOTE — Op Note (Signed)
[facility] Patient Name: Heather May Procedure Date: 05/15/2018 3:41 PM MRN: [id number] Endoscopist: Mallie Mussel L. Loletha Carrow , MD Age: 72 Referring MD:  Date of Birth: [birth year] Gender: Female Account #: [id number] Procedure:                Colonoscopy Indications:              Lower abdominal pain, Chronic diarrhea Medicines:                Monitored Anesthesia Care Procedure:                Pre-Anesthesia Assessment:                           - Prior to the procedure, a History and Physical                            was performed, and patient medications and                            allergies were reviewed. The patient's tolerance of                            previous anesthesia was also reviewed. The risks                            and benefits of the procedure and the sedation                            options and risks were discussed with the patient.                            All questions were answered, and informed consent                            was obtained. Prior Anticoagulants: The patient                            last took aspirin 1 day and Plavix (clopidogrel) 5                            days prior to the procedure. ASA Grade Assessment:                            III - A patient with severe systemic disease. After                            reviewing the risks and benefits, the patient was                            deemed in satisfactory condition to undergo the                            procedure.  After obtaining informed consent, the colonoscope                            was passed under direct vision. Throughout the                            procedure, the patient's blood pressure, pulse, and                            oxygen saturations were monitored continuously. The                            Colonoscope was introduced through the anus and                            advanced to the the cecum, identified by                  appendiceal orifice and ileocecal valve. The                            colonoscopy was performed with difficulty due to                            multiple diverticula in the colon, a redundant                            colon, significant looping and a tortuous colon.                            The patient tolerated the procedure fairly well.                            The quality of the bowel preparation was good. The                            ileocecal valve, appendiceal orifice, and rectum                            were photographed. The quality of the bowel                            preparation was evaluated using the BBPS [facility]                            Bowel Preparation Scale) with scores of: Right                            Colon = 2, Transverse Colon = 2 and Left Colon = 2.                            The total BBPS score equals 6. Scope In: 3:47:22 PM Scope Out: 4:37:00 PM Scope Withdrawal Time: 0 hours 39 minutes 0 seconds  Total Procedure Duration: 0 hours  49 minutes 38 seconds  Findings:                 The digital rectal exam findings include decreased                            sphincter tone.                           A 18 mm polyp was found in the cecum. The polyp was                            sessile. Area was successfully injected with 6 mL                            saline for a lift polypectomy. The polyp was                            removed with a piecemeal technique using a hot                            snare. Resection and retrieval were complete.                           A 15 mm polyp was found in the cecum. The polyp was                            sessile. Area was successfully injected with 4 mL                            saline for a lift polypectomy. The polyp was                            removed with a hot snare. Resection and retrieval                            were complete.                           Two sessile polyps were  found in the ascending                            colon and ileocecal valve. The polyps were 4 to 6                            mm in size. These polyps were removed with a cold                            snare. Resection and retrieval were complete.                           A 8 mm polyp was found in the ascending colon. The  polyp was pedunculated. The polyp was removed with                            a hot snare. Resection and retrieval were complete.                           Normal mucosa was found in the entire colon.                            Biopsies for histology were taken with a cold                            forceps from the right colon and left colon for                            evaluation of microscopic colitis.                           Many small and large-mouthed diverticula were found                            in the left colon and right colon.                           Four sessile polyps were found in the transverse                            colon and ascending colon. The polyps were 4 to 6                            mm in size. These polyps were removed with a cold                            snare. Resection and retrieval were complete.                           Two pedunculated polyps were found in the rectum                            and descending colon. The polyps were 6 mm in size.                            These polyps were removed with a hot snare.                            Resection and retrieval were complete.                           Internal hemorrhoids were found. The hemorrhoids                            were medium-sized and Grade I (internal hemorrhoids  that do not prolapse).                           Retroflexion in the rectum was not performed due to                            anatomy. Complications:            No immediate complications. Estimated Blood Loss:     Estimated blood loss was  minimal. Impression:               - Decreased sphincter tone found on digital rectal                            exam.                           - One 18 mm polyp in the cecum, removed piecemeal                            using a hot snare. Resected and retrieved. Injected.                           - One 15 mm polyp in the cecum, removed with a hot                            snare. Resected and retrieved. Injected.                           - Two 4 to 6 mm polyps in the ascending colon and                            at the ileocecal valve, removed with a cold snare.                            Resected and retrieved.                           - One 8 mm polyp in the ascending colon, removed                            with a hot snare. Resected and retrieved.                           - Normal mucosa in the entire examined colon.                            Biopsied.                           - Diverticulosis in the left colon and in the right                            colon.                           -  Four 4 to 6 mm polyps in the transverse colon and                            in the ascending colon, removed with a cold snare.                            Resected and retrieved.                           - Two 6 mm polyps in the rectum and in the                            descending colon, removed with a hot snare.                            Resected and retrieved.                           - Internal hemorrhoids. Recommendation:           - Patient has a contact number available for                            emergencies. The signs and symptoms of potential                            delayed complications were discussed with the                            patient. Return to normal activities tomorrow.                            Written discharge instructions were provided to the                            patient.                           - Resume previous diet.                            - Resume Plavix (clopidogrel) at prior dose in 5                            days.                           - Await pathology results.                           - Repeat colonoscopy is recommended for                            surveillance. The colonoscopy date will be  determined after pathology results from today's                            exam become available for review.                           - Discuss with your primary care provider a brief                            period of time off metformin to see if diarrhea                            improves. Kriston Mckinnie L. Loletha Carrow, MD 05/15/2018 4:57:16 PM This report has been signed electronically.

## 2018-05-15 NOTE — Progress Notes (Signed)
Pt's states no medical or surgical changes since previsit or office visit. 

## 2018-05-15 NOTE — Progress Notes (Signed)
To PACU pt awake alert, Report to RN

## 2018-05-15 NOTE — Patient Instructions (Signed)
Discharge instructions given. Handouts on polyps,diverticulosis and hemorrhoids. Resume previous medications. Resume Plavix in 5 days. YOU HAD AN ENDOSCOPIC PROCEDURE TODAY AT [facility]:   Refer to the procedure report that was given to you for any specific questions about what was found during the examination.  If the procedure report does not answer your questions, please call your gastroenterologist to clarify.  If you requested that your care partner not be given the details of your procedure findings, then the procedure report has been included in a sealed envelope for you to review at your convenience later.  YOU SHOULD EXPECT: Some feelings of bloating in the abdomen. Passage of more gas than usual.  Walking can help get rid of the air that was put into your GI tract during the procedure and reduce the bloating. If you had a lower endoscopy (such as a colonoscopy or flexible sigmoidoscopy) you may notice spotting of blood in your stool or on the toilet paper. If you underwent a bowel prep for your procedure, you may not have a normal bowel movement for a few days.  Please Note:  You might notice some irritation and congestion in your nose or some drainage.  This is from the oxygen used during your procedure.  There is no need for concern and it should clear up in a day or so.  SYMPTOMS TO REPORT IMMEDIATELY:   Following lower endoscopy (colonoscopy or flexible sigmoidoscopy):  Excessive amounts of blood in the stool  Significant tenderness or worsening of abdominal pains  Swelling of the abdomen that is new, acute  Fever of 100F or higher   For urgent or emergent issues, a gastroenterologist can be reached at any hour by calling [phone].   DIET:  We do recommend a small meal at first, but then you may proceed to your regular diet.  Drink plenty of fluids but you should avoid alcoholic beverages for 24 hours.  ACTIVITY:  You should plan to take it easy for  the rest of today and you should NOT DRIVE or use heavy machinery until tomorrow (because of the sedation medicines used during the test).    FOLLOW UP: Our staff will call the number listed on your records the next business day following your procedure to check on you and address any questions or concerns that you may have regarding the information given to you following your procedure. If we do not reach you, we will leave a message.  However, if you are feeling well and you are not experiencing any problems, there is no need to return our call.  We will assume that you have returned to your regular daily activities without incident.  If any biopsies were taken you will be contacted by phone or by letter within the next 1-3 weeks.  Please call us at [phone] if you have not heard about the biopsies in 3 weeks.    SIGNATURES/CONFIDENTIALITY: You and/or your care partner have signed paperwork which will be entered into your electronic medical record.  These signatures attest to the fact that that the information above on your After Visit Summary has been reviewed and is understood.  Full responsibility of the confidentiality of this discharge information lies with you and/or your care-partner.

## 2018-05-15 NOTE — Progress Notes (Signed)
Called to room to assist during endoscopic procedure.  Patient ID and intended procedure confirmed with present staff. Received instructions for my participation in the procedure from the performing physician.  

## 2018-05-16 ENCOUNTER — Telehealth: Payer: Self-pay

## 2018-05-16 ENCOUNTER — Telehealth: Payer: Self-pay | Admitting: *Deleted

## 2018-05-16 NOTE — Telephone Encounter (Signed)
NO ANSWER, MESSAGE LEFT FOR PATIENT. 

## 2018-05-16 NOTE — Telephone Encounter (Signed)
No answer, second call.  Left message to call if questions or concerns. 

## 2018-05-22 ENCOUNTER — Encounter: Payer: Self-pay | Admitting: Gastroenterology

## 2018-05-22 LAB — CUP PACEART REMOTE DEVICE CHECK
Battery Remaining Longevity: 72 mo
Battery Remaining Percentage: 100 %
Brady Statistic RA Percent Paced: 0 %
Brady Statistic RV Percent Paced: 58 %
Date Time Interrogation Session: 20190702084100
Implantable Pulse Generator Implant Date: 20160513
Lead Channel Impedance Value: 355 Ohm
Lead Channel Impedance Value: 673 Ohm
Lead Channel Pacing Threshold Amplitude: 1.9 V
Lead Channel Pacing Threshold Pulse Width: 0.4 ms
Lead Channel Setting Pacing Amplitude: 2.5 V
Lead Channel Setting Pacing Pulse Width: 0.4 ms
Lead Channel Setting Sensing Sensitivity: 0.6 mV
Pulse Gen Serial Number: 732886

## 2018-06-05 DIAGNOSIS — E7800 Pure hypercholesterolemia, unspecified: Secondary | ICD-10-CM | POA: Diagnosis not present

## 2018-06-05 DIAGNOSIS — E1165 Type 2 diabetes mellitus with hyperglycemia: Secondary | ICD-10-CM | POA: Diagnosis not present

## 2018-06-05 DIAGNOSIS — B354 Tinea corporis: Secondary | ICD-10-CM | POA: Diagnosis not present

## 2018-06-05 DIAGNOSIS — H9193 Unspecified hearing loss, bilateral: Secondary | ICD-10-CM | POA: Diagnosis not present

## 2018-06-05 DIAGNOSIS — H6123 Impacted cerumen, bilateral: Secondary | ICD-10-CM | POA: Diagnosis not present

## 2018-06-05 DIAGNOSIS — Z6832 Body mass index (BMI) 32.0-32.9, adult: Secondary | ICD-10-CM | POA: Diagnosis not present

## 2018-06-05 DIAGNOSIS — E119 Type 2 diabetes mellitus without complications: Secondary | ICD-10-CM | POA: Diagnosis not present

## 2018-06-20 ENCOUNTER — Encounter: Payer: Self-pay | Admitting: Neurology

## 2018-06-20 ENCOUNTER — Ambulatory Visit (INDEPENDENT_AMBULATORY_CARE_PROVIDER_SITE_OTHER): Payer: Medicare Other | Admitting: Neurology

## 2018-06-20 VITALS — BP 135/72 | HR 59 | Ht 66.0 in | Wt 203.0 lb

## 2018-06-20 DIAGNOSIS — I481 Persistent atrial fibrillation: Secondary | ICD-10-CM

## 2018-06-20 DIAGNOSIS — I6381 Other cerebral infarction due to occlusion or stenosis of small artery: Secondary | ICD-10-CM | POA: Diagnosis not present

## 2018-06-20 DIAGNOSIS — S066XAA Traumatic subarachnoid hemorrhage with loss of consciousness status unknown, initial encounter: Secondary | ICD-10-CM | POA: Insufficient documentation

## 2018-06-20 DIAGNOSIS — S066X9A Traumatic subarachnoid hemorrhage with loss of consciousness of unspecified duration, initial encounter: Secondary | ICD-10-CM | POA: Insufficient documentation

## 2018-06-20 DIAGNOSIS — G4731 Primary central sleep apnea: Secondary | ICD-10-CM | POA: Diagnosis not present

## 2018-06-20 DIAGNOSIS — I4819 Other persistent atrial fibrillation: Secondary | ICD-10-CM

## 2018-06-20 DIAGNOSIS — S066X5S Traumatic subarachnoid hemorrhage with loss of consciousness greater than 24 hours with return to pre-existing conscious level, sequela: Secondary | ICD-10-CM

## 2018-06-20 NOTE — Patient Instructions (Signed)

## 2018-06-20 NOTE — Progress Notes (Signed)
SLEEP MEDICINE CLINIC   Provider:  Larey Seat, [place] D  Primary Care Physician:  [name], MD   Referring Provider: Dr. Sarina Ill, MD    Chief Complaint  Patient presents with  . Follow-up    Room 10; alone   . Initial CPAP    Reports CPAP is going well, no issues to report.     HPI:  Heather May is a 72 y.o. female patient, seen here I her first RV after a new sleep study from 02-08-2018, [name] had been referred to me by Dr. Jaynee Eagles, her primary care physician is Dr. Rachell Cipro, but it was her cardiologist Dr. Rayann Heman who follows her for atrial fibrillation that recommended a sleep study.  The patient is status post watchman procedure and pacemaker implantation.  Her sleep study revealed severe COMPLEX sleep apnea at an AHI of 31.6/h;  during REM sleep the AHI was not increased, but there was a significant supine dependent sleep with an AHI of 34.4 versus a  nonsupine AHI of 13.7.  She did not have prolonged oxygen desaturations, but her sleep efficiency was rather poor she slept less than 60% of the recorded time.  There were at  baseline12 obstructive apneas, 3 central apneas and 14 mixed apneas.  The diagnosis was complex sleep apnea she was titrated to CPAP but central apneas emerged, she was then switched to a BiPAP function the sleep latency was only helpful minute once BiPAP was initiated and to sleep efficiency rose to 91.5%.  However they were now 39 central apneas, and 40 mixed apneas which only 20 obstructive apneas.   I stated that the positive airway pressure titration was incomplete. [name] returned on 18 Mar 2018 for a full night titration.  BiPAP was initiated at 10/6 centimeters water pressure and she switched to BiPAP ST.  She was then changed to ASV mode with a minimum pressure of 3, maximum pressure support of 15 and an end expiratory pressure of 5 cmH2O.  Here the AHI was reduced at 0.5/h.  She also used a small full facemask.  She states that she  is still using a fissure and pico Simplus full facemask in small size.  I am able to review the first compliance download for this new patient on CPAP-ASV.  She is 100% compliant has used her machine 30 out of 30 days and only 3 of those days less than 4 hours, the still is a very high compliance by time over 90%, average use of time is 6 hours and 21 minutes at night, the patient uses a minimum EPAP of 5 and a minimum pressure support of 3 and a maximum pressure support of 15 cmH2O.  Her apnea index is 2.4/h hypercapnia Cecila 3.7/h, she does not have major air leaks based on these data I think this is the optimal treatment for her.  She also noted clinically how much she has improved since she has a functioning machine, over the last 2-1/2 months she has become much less daytime sleepy, she now can stay awake when watching a movie, her Epworth Sleepiness Scale is reduced to 2 points and her fatigue severity scale to 9 points.       01-10-2018, seen here in a referral from Dr. Jaynee Eagles, whose note I have copied below. 01-10-2018 .  Chief complaint according to patient : "The fall asleep a lot while watching TV in daytime, and at nighttime I may have hours before I can go  to sleep" . She used to love going to the cinema but now has stopped because she " sleeps through "  Sleep habits are as follows: The patient spends her evening hours watching TV.  She does have trouble with ambulation and is using a walker that is seated, at home she will watch TV while seated in her favorite recliner, she estimates that she will fall asleep for a total time of about an hour while in front of the TV between 7 and 10 PM.  She will retreat to get ready for bed at about 1130 or midnight, probably late.  While in bed she has often trouble initiating sleep.  Her bedroom is described as cool, quiet and dark.  She is attached to a heart monitor at night but admits some blue light, and she does use a night light.  Once asleep she  can stay asleep on average for 3 hours before she wakes up.  She does not know why she wakes up, there is no specific discomfort she does not usually have to go to the bathroom sometimes she has noted a dry mouth but this did not wake her.,  Her partner is using a CPAP and is usually asleep and has not witnessed her to have apnea or snoring.  However she has heard before that she is a snorer, and this has been known for many years. Her husband use to wake her when she stopped breathing - 15 years ago.  She rises at varius times, is retired, has no routine. She goes to water gymnastics or water aerobics twice a week when she has to rise earlier, around 6.30 AM. The exercise begins at 9 AM.  All other days she stays longer in bed and sometimes until noon. She spends a lot of time in bed without sleeping , averaging still 9-10 hours. She does not schedule any naps for the daytime but she does frequently doze off when physically not active and mentally not stimulated.  She has not fallen asleep while in a conversation but certainly in the waiting room, while reading.  Today's Epworth sleepiness score was endorsed at 13 points, fatigue severity at only 17 points and the geriatric depression score at 2 out of 15 points.  The patient does not endorsed tinnitus, nausea, palpitations, diaphoresis, headaches waking her .   Sleep medical history and family sleep history: She has polyneuropathy from diabetes (Last hgba1c in my records 8.2 10/2015, b12 and tsh normal 05/2016).  Patient has a past medical history of atrial fibrillation, CAD s/p stents,  bradycardia s/p pacemaker, type 2 diabetes, hyperlipidemia, essential tremor, cerebral infarction, hypertension, peripheral vascular disease, spinal stenosis, pain in limb, seizure.She walks, go to the gym, go to movies, socially active. CT of the head She nods off a lot during the day and naps. She wakes at night frequently. But ESS only 5/10. She has dry mouth in the morning  and difficulty swallowing. Occ wakes herself up snoring.   Social history: lives with a  partner, widowed 15 years ago. Husband had a PE.  She has one cat, retired since 2012 , from [employer]. Quit smoking over 30 years ago, not using ETOH, caffeine: iced tea made of herbal tea. Decaffeinated  coffee once a day.      Dr Cathren Laine notes:" Patient is her for a new problem, new stroke found on CT head.  Appears to be small-vessel disease due to location and size - however cannot rule out  afib. Will send notes to Dr. Joylene Grapes and would not rule out starting Eliquis in the future. Had a long discussion with patient and her partner, reviewed images and answered questions. She has had a watchman procedure with Dr. Rayann Heman , an umbrella protecting her from an apical thrombotic formation, placed in 2017 .  CT 12/22/2017: Abnormal CT scan of the head showing remote age right internal capsule infarct and mild age-appropriate changes of chronic microvascular ischemia and generalized cerebral atrophy.  Compared to the CT head dated 06/30/2016 the right internal capsule infarct appears to be new  Interval history 12/11/2016: Patient has a new issue today -likely sleep apnea, cardiologist stated she should have a sleep eval. 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  Patient has a past medical history of atrial fibrillation, CAD s/p stents,  bradycardia s/p pacemaker, type 2 diabetes, hyperlipidemia, essential tremor, cerebral infarction, hypertension, peripheral vascular disease, spinal stenosis, pain in limb, seizure. ESS 12. She is still on the keppra. Discussed discontinuing. She is excessively tired, no "ambition", she sits in her recliner all day and nods off, tired all the time, snoring. 6 weeks ago she was watching her grandchildren, she had just awakened from sleep, she felt all "floppy" and she sat inher  recliner all day, a lot more fatigue, she thought the right side of her mouth was drooping for a few weeks but no one else saw it and nothing else focal she just felt fatigues for a few weeks. 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 CT head to eval for any new strokes.    Review of Systems: Out of a complete 14 system review, the patient complains of only the following symptoms, and all other reviewed systems are negative.  No longer sleepy, sleeps through the night, is alert in daytime. Less ankle edema. No nocturia above 2 .  Epworth score down to 2 from 13/ 24 points  ,  Fatigue severity score down to 9 from  22/ 63 points , depression score 2/15    Social History   Socioeconomic History  . Marital status: Widowed    Spouse name: Not on file  . Number of children: 5  . Years of education: 41  . Highest education level: Not on file  Occupational History  . Occupation: Retired  Scientific laboratory technician  . Financial resource strain: Not on file  . Food insecurity:    Worry: Not on file    Inability: Not on file  . Transportation needs:    Medical: Not on file    Non-medical: Not on file  Tobacco Use  . Smoking status: Former Smoker    Last attempt to quit: 07/06/2009    Years since quitting: 8.9  . Smokeless tobacco: Never Used  . Tobacco comment: didnt smoke much  Substance and Sexual Activity  . Alcohol use: No    Alcohol/week: 0.0 standard drinks    Comment: very infrequently  . Drug use: No  . Sexual activity: Not on file  Lifestyle  . Physical activity:    Days per week: Not on file    Minutes per session: Not on file  . Stress: Not on file  Relationships  . Social connections:    Talks on phone: Not on file    Gets together: Not on file    Attends religious service: Not on file    Active member  of club or organization: Not on file    Attends meetings of clubs or organizations: Not on file    Relationship status: Not on file  . Intimate partner violence:    Fear of  current or ex partner: Not on file    Emotionally abused: Not on file    Physically abused: Not on file    Forced sexual activity: Not on file  Other Topics Concern  . Not on file  Social History Narrative   [place] recently moved to [facility] [place].    Previously worked as a Teacher, music.   Caffeine use: only when eating out tea/soda   Right-handed   Lives at home with partner/roommate    Family History  Problem Relation Age of Onset  . Stroke Mother   . Epilepsy Mother   . Stroke Father   . Heart attack Father   . Atrial fibrillation Sister   . Atrial fibrillation Brother   . Diabetes Mellitus II Brother   . Pulmonary embolism Son   . Colon cancer Neg Hx   . Esophageal cancer Neg Hx   . Stomach cancer Neg Hx   . Rectal cancer Neg Hx     Past Medical History:  Diagnosis Date  . CAD (coronary artery disease) 08/15/13  . Diabetes mellitus without complication ([place])   . Diverticulosis   . Essential tremor   . GERD (gastroesophageal reflux disease)   . History of long-term use of multiple prescription drugs   . Hyperlipidemia    mixed  . Hypertension   . Long-term (current) use of anticoagulants   . PAD (peripheral artery disease) ([place])   . Percutaneous transluminal coronary angioplasty status   . Permanent atrial fibrillation ([facility])    chads2vasc score is at least 5  . Pulmonary embolism ([place])   . Pulmonary fibrosis ([place])   . Seizure ([place]) 2014   occured in setting of [place]  . Sick sinus syndrome ([place])   . Spinal stenosis   . Subarachnoid hemorrhage ([place])   . Transient ischemic attack     Past Surgical History:  Procedure Laterality Date  . ABDOMINAL HYSTERECTOMY  2004  . BASAL CELL CARCINOMA EXCISION    . CARDIAC CATHETERIZATION  10/14   PCI   . carpel tunnel surgery Bilateral 2006/ 2010  . CESAREAN SECTION N/A 1978   x 1  . HERNIA REPAIR  06/2010  . LEFT ATRIAL APPENDAGE OCCLUSION N/A 08/26/2015   Procedure: LEFT ATRIAL APPENDAGE OCCLUSION;   Surgeon: Thompson Grayer, MD;  Location: [facility];  Service: Cardiovascular;  Laterality: N/A;  . PACEMAKER INSERTION  03/05/15   [name] Accolade DR implanted in [place]  . removal of big toe nail Bilateral   . TEE WITHOUT CARDIOVERSION N/A 08/16/2015   Procedure: TRANSESOPHAGEAL ECHOCARDIOGRAM (TEE);  Surgeon: Pixie Casino, MD;  Location[facility] ENDOSCOPY;  Service: Cardiovascular;  Laterality: N/A;  . TEE WITHOUT CARDIOVERSION N/A 10/12/2015   Procedure: TRANSESOPHAGEAL ECHOCARDIOGRAM (TEE);  Surgeon: Pixie Casino, MD;  Location: [facility] ENDOSCOPY;  Service: Cardiovascular;  Laterality: N/A;  . titanium pins inserted metatarsol bone  02/2012   2 pins placed     Current Outpatient Medications  Medication Sig Dispense Refill  . benazepril (LOTENSIN) 10 MG tablet Take 10 mg by mouth daily.    . cetirizine (ZYRTEC) 10 MG tablet Take 10 mg by mouth daily as needed.   5  . clopidogrel (PLAVIX) 75 MG tablet Take 1 tablet (  75 mg total) by mouth daily. 30 tablet 11  . diltiazem (CARDIZEM) 30 MG tablet TAKE 1 TABLET(30 MG) BY MOUTH DAILY AS NEEDED 30 tablet 8  . glipiZIDE (GLUCOTROL) 5 MG tablet Take 5 mg by mouth 2 (two) times daily.  3  . levETIRAcetam (KEPPRA) 500 MG tablet Take 500 mg by mouth daily.     [name] lovastatin (MEVACOR) 40 MG tablet Take 40 mg by mouth at bedtime.    . metFORMIN (GLUCOPHAGE) 1000 MG tablet Take 1,000 mg by mouth 2 (two) times daily with a meal. Take 1500mg   In the am and 1000 mg in afternoon    . Multiple Vitamin (MULTIVITAMIN) tablet Take 1 tablet by mouth daily.    . ondansetron (ZOFRAN) 4 MG tablet Take 4 mg by mouth every 8 (eight) hours as needed for nausea or vomiting.    [name] OVER THE COUNTER MEDICATION Take 1 Dose by mouth daily. OTC Joint Juice "1,500 mg of glucosamine plus chondroitin, Vitamin C and Vitamin D"    . RaNITidine HCl (ZANTAC PO) Take by mouth.     No current facility-administered medications for this visit.     Allergies as  of 06/20/2018 - Review Complete 06/20/2018  Allergen Reaction Noted  . Hydrocodone Nausea And Vomiting 12/11/2017  . Sulfa antibiotics  07/07/2015    Vitals: BP 135/72   Pulse (!) 59   Ht 5\' 6"  (1.676 m)   Wt 203 lb (92.1 kg)   BMI 32.77 kg/m  Last Weight:  Wt Readings from Last 1 Encounters:  06/20/18 203 lb (92.1 kg)   NLG:XQJJ mass index is 32.77 kg/m.     Last Height:   Ht Readings from Last 1 Encounters:  06/20/18 5\' 6"  (1.676 m)    Physical exam:  General: The patient is awake, alert and appears not in acute distress. The patient is well groomed. Head: Normocephalic, atraumatic. Neck is supple. Mallampati 5 - not able to see uvula. Pale mucosa. ,  neck circumference: 15.75. Nasal airflow patent ,Retrognathia is not seen. Jaw tremor. Titubation, tongue tremor.   Cardiovascular:  Regular rate and rhythm , without  murmurs or carotid bruit, and without distended neck veins. Respiratory: Lungs are clear to auscultation. Skin:  Without evidence of edema, or rash Trunk: BMI is 33. The patient has a truncal tremor- Neurologic exam :.   Memory subjective described as intact.   Attention span & concentration ability appears normal.  Speech with dysphonia, very raspy  .   Cranial nerves: Pupils are equal and briskly reactive to light.  Early cataracts.  Ptosis on the right. Dry eyes. Extraocular movements  in vertical and horizontal planes intact and without nystagmus. Visual fields deferred.  Facial sensation intact to fine touch.Facial motor strength is symmetric and tongue and uvula move midline. Shoulder shrug was symmetrical.   Motor exam:  Right sided weakness after SAH- increased tone, brisk reflexes, coordination in right dominant hand impaired. Tremor is essential or basal ganglion related. Sensory:  Fine touch, pinprick and vibration were decreased in right hand.  Coordination: right Finger-to-nose maneuver with evidence of ataxia, dysmetria and  tremor. Gait and  station: deferred , uses a walker.    I would like to thank Dr. Jaynee Eagles for sending this pleasant patient to my way.  Mrs. 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 She had been excessively daytime sleepy for many years and now under ASV PAP therapy is recovered to a clinical level that allows her to enjoy the cinema and theater visits again.    Assessment:  After physical and neurologic examination, review of laboratory studies,  Personal review of imaging studies, reports of other /same  Imaging studies, results of polysomnography and / or neurophysiology testing and pre-existing records as far as provided in visit., my assessment is   1) COMPLEX and severe Sleep Apnea , failed by CPAP, BiPAP and BiPAP ST and now using ASV 5, EEP, 3 cm minimum pressure support and 15 cm maximum pressure. The patient assured me that she sleeps with her mouth closed. She is no longer daytime sleepy.  She will follow in 6 month and thereafter yearly in the sleep clinic.  The patient was advised of the nature of the diagnosed disorder, the treatment options and the  risks for general health and wellness arising from not treating the condition. I offered an order for a non FFM ( full face mask) but she is feels well on FFM, and will continue to use.   I spent more than 25 minutes of face to face time with the patient. Greater than 50% of time was spent in counseling and coordination of care. We have discussed the diagnosis and differential and I answered the patient's questions.     Larey Seat, MD 0/21/1173, 5:67 AM  Certified in Neurology by ABPN Certified in [place] by [facility] [address], [place], [place] [address]

## 2018-06-26 DIAGNOSIS — Z23 Encounter for immunization: Secondary | ICD-10-CM | POA: Diagnosis not present

## 2018-06-26 DIAGNOSIS — L989 Disorder of the skin and subcutaneous tissue, unspecified: Secondary | ICD-10-CM | POA: Diagnosis not present

## 2018-06-26 DIAGNOSIS — Z6832 Body mass index (BMI) 32.0-32.9, adult: Secondary | ICD-10-CM | POA: Diagnosis not present

## 2018-06-26 DIAGNOSIS — I10 Essential (primary) hypertension: Secondary | ICD-10-CM | POA: Diagnosis not present

## 2018-06-26 DIAGNOSIS — E119 Type 2 diabetes mellitus without complications: Secondary | ICD-10-CM | POA: Diagnosis not present

## 2018-06-26 DIAGNOSIS — R197 Diarrhea, unspecified: Secondary | ICD-10-CM | POA: Diagnosis not present

## 2018-07-03 DIAGNOSIS — Z01110 Encounter for hearing examination following failed hearing screening: Secondary | ICD-10-CM | POA: Diagnosis not present

## 2018-07-03 DIAGNOSIS — H903 Sensorineural hearing loss, bilateral: Secondary | ICD-10-CM | POA: Diagnosis not present

## 2018-07-23 ENCOUNTER — Ambulatory Visit (INDEPENDENT_AMBULATORY_CARE_PROVIDER_SITE_OTHER): Payer: Medicare Other | Admitting: *Deleted

## 2018-07-23 DIAGNOSIS — R001 Bradycardia, unspecified: Secondary | ICD-10-CM

## 2018-07-23 DIAGNOSIS — I4821 Permanent atrial fibrillation: Secondary | ICD-10-CM | POA: Diagnosis not present

## 2018-07-23 NOTE — Progress Notes (Signed)
Remote pacemaker transmission.   

## 2018-08-13 DIAGNOSIS — E119 Type 2 diabetes mellitus without complications: Secondary | ICD-10-CM | POA: Diagnosis not present

## 2018-08-13 DIAGNOSIS — H2513 Age-related nuclear cataract, bilateral: Secondary | ICD-10-CM | POA: Diagnosis not present

## 2018-08-13 DIAGNOSIS — H52203 Unspecified astigmatism, bilateral: Secondary | ICD-10-CM | POA: Diagnosis not present

## 2018-08-13 DIAGNOSIS — H5213 Myopia, bilateral: Secondary | ICD-10-CM | POA: Diagnosis not present

## 2018-08-16 LAB — CUP PACEART REMOTE DEVICE CHECK
Battery Remaining Longevity: 65 mo
Brady Statistic RA Percent Paced: 0 %
Brady Statistic RV Percent Paced: 61 %
Date Time Interrogation Session: 20191025103525
Implantable Pulse Generator Implant Date: 20160513
Lead Channel Impedance Value: 360 Ohm
Lead Channel Impedance Value: 673 Ohm
Lead Channel Sensing Intrinsic Amplitude: 3 mV
Lead Channel Setting Pacing Amplitude: 2.7 V
Lead Channel Setting Pacing Pulse Width: 0.4 ms
Lead Channel Setting Sensing Sensitivity: 0.6 mV
Pulse Gen Serial Number: 732886

## 2018-08-22 ENCOUNTER — Ambulatory Visit (INDEPENDENT_AMBULATORY_CARE_PROVIDER_SITE_OTHER): Payer: Medicare Other | Admitting: Gastroenterology

## 2018-08-22 ENCOUNTER — Encounter: Payer: Self-pay | Admitting: Gastroenterology

## 2018-08-22 VITALS — BP 120/78 | HR 78 | Ht 66.0 in | Wt 210.0 lb

## 2018-08-22 DIAGNOSIS — R1030 Lower abdominal pain, unspecified: Secondary | ICD-10-CM

## 2018-08-22 DIAGNOSIS — R110 Nausea: Secondary | ICD-10-CM

## 2018-08-22 DIAGNOSIS — K529 Noninfective gastroenteritis and colitis, unspecified: Secondary | ICD-10-CM | POA: Diagnosis not present

## 2018-08-22 DIAGNOSIS — I6381 Other cerebral infarction due to occlusion or stenosis of small artery: Secondary | ICD-10-CM

## 2018-08-22 NOTE — Patient Instructions (Addendum)
If you are age 74 or older, your body mass index should be between 23-30. Your Body mass index is 33.89 kg/m. If this is out of the aforementioned range listed, please consider follow up with your Primary Care Provider.  If you are age 24 or younger, your body mass index should be between 19-25. Your Body mass index is 33.89 kg/m. If this is out of the aformentioned range listed, please consider follow up with your Primary Care Provider.   It was a pleasure to see you today!  Dr. Loletha Carrow   Food Guidelines for gas and bloating  Many people have difficulty digesting certain foods, causing a variety of distressing and embarrassing symptoms such as abdominal pain, bloating and gas.  These foods may need to be avoided or consumed in small amounts.  Here are some tips that might be helpful for you.  1.   Lactose intolerance is the difficulty or complete inability to digest lactose, the natural sugar in milk and anything made from milk.  This condition is harmless, common, and can begin any time during life.  Some people can digest a modest amount of lactose while others cannot tolerate any.  Also, not all dairy products contain equal amounts of lactose.  For example, hard cheeses such as parmesan have less lactose than soft cheeses such as cheddar.  Yogurt has less lactose than milk or cheese.  Many packaged foods (even many brands of bread) have milk, so read ingredient lists carefully.  It is difficult to test for lactose intolerance, so just try avoiding lactose as much as possible for a week and see what happens with your symptoms.  If you seem to be lactose intolerant, the best plan is to avoid it (but make sure you get calcium from another source).  The next best thing is to use lactase enzyme supplements, available over the counter everywhere.  Just know that many lactose intolerant people need to take several tablets with each serving of dairy to avoid symptoms.  Lastly, a lot of restaurant food is  made with milk or butter.  Many are things you might not suspect, such as mashed potatoes, rice and pasta (cooked with butter) and "grilled" items.  If you are lactose intolerant, it never hurts to ask your server what has milk or butter.  2.   Fiber is an important part of your diet, but not all fiber is well-tolerated.  Insoluble fiber such as bran is often consumed by normal gut bacteria and converted into gas.  Soluble fiber such as oats, squash, carrots and green beans are typically tolerated better.  3.   Some types of carbohydrates can be poorly digested.  Examples include: fructose (apples, cherries, pears, raisins and other dried fruits), fructans (onions, zucchini, large amounts of wheat), sorbitol/mannitol/xylitol and sucralose/Splenda (common artificial sweeteners), and raffinose (lentils, broccoli, cabbage, asparagus, brussel sprouts, many types of beans).  Do a Development worker, community for [employer] and you will find helpful information. Beano, a dietary supplement, will often help with raffinose-containing foods.  As with lactase tablets, you may need several per serving.  4.   Whenever possible, avoid processed food&meats and chemical additives.  High fructose corn syrup, a common sweetener, may be difficult to digest.  Eggs and soy (comes from the soybean, and added to many foods now) are other common bloating/gassy foods.  - Dr. Herma Ard [facility]

## 2018-08-22 NOTE — Progress Notes (Signed)
[facility] Progress Note  Chief Complaint: Chronic diarrhea  Subjective  History:  Heather May was seen 03/15/2018 by our PA for episodic lower abdominal pain with urgency and loose stool. Colonoscopy 05/15/2018 removed multiple polyps were both adenomas and 2 larger right colon SSP's require EMR.  Random colon biopsies negative for microscopic colitis.  My recommendation was then for surveillance colonoscopy in 1 year. She has occasional nausea responds well to Zofran. Her lower GI symptoms have behaved much the same as before, with about 2 episodes per week.  The family member who is a PA recommended she avoid artificial sweeteners, and this seems to have decreased symptoms over the last week.  She has been on metformin for many years without a dose change, so it is unlikely to be related to symptoms.  ROS: Cardiovascular:  no chest pain Respiratory: no dyspnea Decreased mobility as before, due to her CVA  The patient's Past Medical, Family and Social History were reviewed and are on file in the EMR.  Objective:  Med list reviewed  Current Outpatient Medications:  .  benazepril (LOTENSIN) 10 MG tablet, Take 10 mg by mouth daily., Disp: , Rfl:  .  cetirizine (ZYRTEC) 10 MG tablet, Take 10 mg by mouth daily as needed. , Disp: , Rfl: 5 .  clopidogrel (PLAVIX) 75 MG tablet, Take 1 tablet (75 mg total) by mouth daily., Disp: 30 tablet, Rfl: 11 .  diltiazem (CARDIZEM) 30 MG tablet, TAKE 1 TABLET(30 MG) BY MOUTH DAILY AS NEEDED, Disp: 30 tablet, Rfl: 8 .  glipiZIDE (GLUCOTROL) 5 MG tablet, Take 5 mg by mouth daily after breakfast. , Disp: , Rfl: 3 .  levETIRAcetam (KEPPRA) 500 MG tablet, Take 500 mg by mouth daily. , Disp: , Rfl:  .  lovastatin (MEVACOR) 40 MG tablet, Take 40 mg by mouth at bedtime., Disp: , Rfl:  .  metFORMIN (GLUCOPHAGE) 1000 MG tablet, Take 1,000 mg by mouth 2 (two) times daily with a meal. Take 1500mg   In the am and 1000 mg in afternoon, Disp: , Rfl:  .  Multiple  Vitamin (MULTIVITAMIN) tablet, Take 1 tablet by mouth daily., Disp: , Rfl:  .  ondansetron (ZOFRAN) 4 MG tablet, Take 4 mg by mouth every 8 (eight) hours as needed for nausea or vomiting., Disp: , Rfl:  .  RaNITidine HCl (ZANTAC PO), Take by mouth., Disp: , Rfl:   She is no longer on ranitidine  Vital signs in last 24 hrs: Vitals:   08/22/18 0842  BP: 120/78  Pulse: 78    Physical Exam  Sitting in a chair, right-sided facial droop and weakness.  Alert and conversational, somewhat flattened affect.  HEENT: sclera anicteric, oral mucosa moist without lesions  Neck: supple, no thyromegaly, JVD or lymphadenopathy  Cardiac: RRR without murmurs, S1S2 heard, no peripheral edema  Pulm: clear to auscultation bilaterally, normal RR and effort noted  Abdomen: soft, no tenderness, with active bowel sounds. No guarding or palpable hepatosplenomegaly, limited by positioning, unable to get on exam table.  Skin; warm and dry, no jaundice or rash    @ASSESSMENTPLANBEGIN @ Assessment: Encounter Diagnoses  Name Primary?  . Lower abdominal pain Yes  . Chronic diarrhea   . Nausea without vomiting    Her symptoms are most consistent with IBS, possibly some dietary triggers. Dietary advice was given.  Continue as needed use of ondansetron for nausea, which has apparently been prescribed by primary care.  Recall colonoscopy approximately July 2020.    Total  time 20 minutes, over half spent face-to-face with patient in counseling and coordination of care.   Nelida Meuse III

## 2018-08-23 ENCOUNTER — Other Ambulatory Visit: Payer: Self-pay

## 2018-08-23 DIAGNOSIS — D229 Melanocytic nevi, unspecified: Secondary | ICD-10-CM | POA: Diagnosis not present

## 2018-08-23 DIAGNOSIS — D485 Neoplasm of uncertain behavior of skin: Secondary | ICD-10-CM | POA: Diagnosis not present

## 2018-08-23 DIAGNOSIS — L918 Other hypertrophic disorders of the skin: Secondary | ICD-10-CM | POA: Diagnosis not present

## 2018-08-23 DIAGNOSIS — L570 Actinic keratosis: Secondary | ICD-10-CM | POA: Diagnosis not present

## 2018-08-28 DIAGNOSIS — E119 Type 2 diabetes mellitus without complications: Secondary | ICD-10-CM | POA: Diagnosis not present

## 2018-08-28 DIAGNOSIS — M170 Bilateral primary osteoarthritis of knee: Secondary | ICD-10-CM | POA: Diagnosis not present

## 2018-08-28 DIAGNOSIS — Z6833 Body mass index (BMI) 33.0-33.9, adult: Secondary | ICD-10-CM | POA: Diagnosis not present

## 2018-08-28 DIAGNOSIS — L989 Disorder of the skin and subcutaneous tissue, unspecified: Secondary | ICD-10-CM | POA: Diagnosis not present

## 2018-09-04 DIAGNOSIS — M170 Bilateral primary osteoarthritis of knee: Secondary | ICD-10-CM | POA: Diagnosis not present

## 2018-09-04 DIAGNOSIS — Z6834 Body mass index (BMI) 34.0-34.9, adult: Secondary | ICD-10-CM | POA: Diagnosis not present

## 2018-09-13 DIAGNOSIS — M170 Bilateral primary osteoarthritis of knee: Secondary | ICD-10-CM | POA: Diagnosis not present

## 2018-09-18 DIAGNOSIS — M170 Bilateral primary osteoarthritis of knee: Secondary | ICD-10-CM | POA: Diagnosis not present

## 2018-10-22 ENCOUNTER — Ambulatory Visit (INDEPENDENT_AMBULATORY_CARE_PROVIDER_SITE_OTHER): Payer: Medicare Other

## 2018-10-22 DIAGNOSIS — R001 Bradycardia, unspecified: Secondary | ICD-10-CM

## 2018-10-22 DIAGNOSIS — I4821 Permanent atrial fibrillation: Secondary | ICD-10-CM | POA: Diagnosis not present

## 2018-10-22 LAB — CUP PACEART REMOTE DEVICE CHECK
Date Time Interrogation Session: 20191231121019
Implantable Pulse Generator Implant Date: 20160513
Pulse Gen Serial Number: 732886

## 2018-10-22 NOTE — Progress Notes (Signed)
Remote pacemaker transmission.   

## 2018-11-08 DIAGNOSIS — L565 Disseminated superficial actinic porokeratosis (DSAP): Secondary | ICD-10-CM | POA: Diagnosis not present

## 2018-11-08 DIAGNOSIS — L720 Epidermal cyst: Secondary | ICD-10-CM | POA: Diagnosis not present

## 2018-11-27 DIAGNOSIS — K219 Gastro-esophageal reflux disease without esophagitis: Secondary | ICD-10-CM | POA: Diagnosis not present

## 2018-11-27 DIAGNOSIS — Z6836 Body mass index (BMI) 36.0-36.9, adult: Secondary | ICD-10-CM | POA: Diagnosis not present

## 2018-11-27 DIAGNOSIS — I10 Essential (primary) hypertension: Secondary | ICD-10-CM | POA: Diagnosis not present

## 2018-11-27 DIAGNOSIS — E1165 Type 2 diabetes mellitus with hyperglycemia: Secondary | ICD-10-CM | POA: Diagnosis not present
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
## 2018-12-06 ENCOUNTER — Encounter: Payer: Self-pay | Admitting: Nurse Practitioner

## 2018-12-06 ENCOUNTER — Ambulatory Visit (INDEPENDENT_AMBULATORY_CARE_PROVIDER_SITE_OTHER): Payer: Medicare Other | Admitting: Nurse Practitioner

## 2018-12-06 VITALS — BP 116/80 | HR 61 | Ht 66.0 in | Wt 209.2 lb

## 2018-12-06 DIAGNOSIS — I4821 Permanent atrial fibrillation: Secondary | ICD-10-CM | POA: Diagnosis not present

## 2018-12-06 DIAGNOSIS — I2510 Atherosclerotic heart disease of native coronary artery without angina pectoris: Secondary | ICD-10-CM | POA: Diagnosis not present

## 2018-12-06 DIAGNOSIS — R001 Bradycardia, unspecified: Secondary | ICD-10-CM | POA: Diagnosis not present

## 2018-12-06 LAB — CUP PACEART INCLINIC DEVICE CHECK
Date Time Interrogation Session: 20200214091242
Implantable Pulse Generator Implant Date: 20160513
Pulse Gen Serial Number: 732886

## 2018-12-06 NOTE — Patient Instructions (Signed)
Medication Instructions:  none If you need a refill on your cardiac medications before your next appointment, please call your pharmacy.   Lab work: none If you have labs (blood work) drawn today and your tests are completely normal, you will receive your results only by: [name] MyChart Message (if you have MyChart) OR . A paper copy in the mail If you have any lab test that is abnormal or we need to change your treatment, we will call you to review the results.  Testing/Procedures: none  Follow-Up: At [facility], you and your health needs are our priority.  As part of our continuing mission to provide you with exceptional heart care, we have created designated Provider Care Teams.  These Care Teams include your primary Cardiologist (physician) and Advanced Practice Providers (APPs -  Physician Assistants and Nurse Practitioners) who all work together to provide you with the care you need, when you need it. You will need a follow up appointment in 1 years.  Please call our office 2 months in advance to schedule this appointment.  You may see Dr Rayann Heman or one of the following Advanced Practice Providers on your designated Care Team:   Chanetta Marshall, NP . Tommye Standard, PA-C  Any Other Special Instructions Will Be Listed Below (If Applicable). Remote monitoring is used to monitor your Pacemaker from home. This monitoring reduces the number of office visits required to check your device to one time per year. It allows [place] to keep an eye on the functioning of your device to ensure it is working properly. You are scheduled for a device check from home on 01/21/19. You may send your transmission at any time that day. If you have a wireless device, the transmission will be sent automatically. After your physician reviews your transmission, you will receive a postcard with your next transmission date.

## 2018-12-09 ENCOUNTER — Other Ambulatory Visit: Payer: Self-pay | Admitting: Neurology

## 2018-12-09 DIAGNOSIS — I6381 Other cerebral infarction due to occlusion or stenosis of small artery: Secondary | ICD-10-CM

## 2018-12-11 DIAGNOSIS — I10 Essential (primary) hypertension: Secondary | ICD-10-CM | POA: Diagnosis not present

## 2018-12-11 DIAGNOSIS — I519 Heart disease, unspecified: Secondary | ICD-10-CM | POA: Diagnosis not present

## 2018-12-11 DIAGNOSIS — Z6836 Body mass index (BMI) 36.0-36.9, adult: Secondary | ICD-10-CM | POA: Diagnosis not present

## 2018-12-11 DIAGNOSIS — E119 Type 2 diabetes mellitus without complications: Secondary | ICD-10-CM | POA: Diagnosis not present

## 2018-12-15 ENCOUNTER — Encounter: Payer: Self-pay | Admitting: Family Medicine

## 2018-12-16 ENCOUNTER — Ambulatory Visit (INDEPENDENT_AMBULATORY_CARE_PROVIDER_SITE_OTHER): Payer: Medicare Other | Admitting: Family Medicine

## 2018-12-16 ENCOUNTER — Encounter: Payer: Self-pay | Admitting: Family Medicine

## 2018-12-16 ENCOUNTER — Ambulatory Visit: Payer: Medicare Other | Admitting: Nurse Practitioner

## 2018-12-16 ENCOUNTER — Ambulatory Visit: Payer: Medicare Other | Admitting: Neurology

## 2018-12-16 VITALS — BP 132/75 | HR 59 | Ht 66.0 in | Wt 212.2 lb

## 2018-12-16 DIAGNOSIS — G4731 Primary central sleep apnea: Secondary | ICD-10-CM

## 2018-12-16 DIAGNOSIS — G250 Essential tremor: Secondary | ICD-10-CM

## 2018-12-16 DIAGNOSIS — Z8679 Personal history of other diseases of the circulatory system: Secondary | ICD-10-CM

## 2018-12-16 DIAGNOSIS — I6381 Other cerebral infarction due to occlusion or stenosis of small artery: Secondary | ICD-10-CM

## 2018-12-16 DIAGNOSIS — Z87898 Personal history of other specified conditions: Secondary | ICD-10-CM

## 2018-12-16 NOTE — Patient Instructions (Addendum)
Stop Keppra, caution with driving for next 6 months  Call with any concerns of seizure, seek medical attention immediate for seizure activity  Continue to monitor tremor, consider treatment as needed for worsening  Continue BiPAP every night and for greater than 4 hours each night   Seizure, Adult When you have a seizure:  Parts of your body may move.  You may have a change in how aware or awake (conscious) you are.  You may shake (convulse). Seizures usually last from 30 seconds to 2 minutes. Usually, they are not harmful unless they last a long time. What are the signs or symptoms? Common symptoms of this condition include:  Shaking (convulsions).  Stiffness in the body.  Passing out (losing consciousness).  Uncontrolled movements in the: ? Arms or legs. ? Eyes. ? Head. ? Mouth. Some people have symptoms right before a seizure happens. These symptoms may include:  Fear.  Worry (anxiety).  Feeling like you are going to throw up (nausea).  Feeling like the room is spinning (vertigo).  Feeling like you saw or heard something before (dj vu).  Odd tastes or smells.  Changes in vision, such as seeing flashing lights or spots. Follow these instructions at home: Medicines   Take over-the-counter and prescription medicines only as told by your doctor.  Do not eat or drink anything that may keep your medicine from working, such as alcohol. Activity  Do not do any activities that would be dangerous if you had another seizure, like driving or swimming. Wait until your doctor says it is safe for you to do them.  If you live in the [place] ask your local DMV (department of motor vehicles) when you can drive.  Get plenty of rest. Teaching others   Teach friends and family what to do when you have a seizure. They should: ? Lay you on the ground. ? Protect your head and body. ? Loosen any tight clothing around your neck. ? Turn you on your side. ? Not hold you  down. ? Not put anything into your mouth. ? Know whether or not you need emergency care. ? Stay with you until you are better. General instructions  Contact your doctor each time you have a seizure.  Avoid anything that gives you seizures.  Keep a seizure diary. Write down: ? What you think caused each seizure. ? What you remember about each seizure.  Keep all follow-up visits as told by your doctor. This is important. Contact a doctor if:  You have another seizure.  You have seizures more often.  There is any change in what happens during your seizures.  You keep having seizures with treatment.  You have symptoms of being sick or having an infection. Get help right away if:  You have a seizure: ? That lasts longer than 5 minutes. ? That is different than seizures you had before. ? That makes it harder to breathe. ? After you hurt your head.  After a seizure, you cannot speak or use a part of your body.  After a seizure, you are confused or have a bad headache.  You have two or more seizures in a row.  You are having seizures more often.  You do not wake up right after a seizure.  You get hurt during a seizure. In an emergency:  These symptoms may be an emergency. Do not wait to see if the symptoms will go away. Get medical help right away. Call your local emergency services (911  in the [place] Do not drive yourself to the hospital. Summary  Seizures usually last from 30 seconds to 2 minutes. Usually, they are not harmful unless they last a long time.  Do not eat or drink anything that may keep your medicine from working, such as alcohol.  Teach friends and family what to do when you have a seizure.  Contact your doctor each time you have a seizure. This information is not intended to replace advice given to you by your health care provider. Make sure you discuss any questions you have with your health care provider. Document Released: 03/27/2008 Document  Revised: 07/03/2018 Document Reviewed: 11/15/2017 Elsevier Interactive Patient Education  2019 Elsevier Inc. Sleep Apnea Sleep apnea affects breathing during sleep. It causes breathing to stop for a short time or to become shallow. It can also increase the risk of:  Heart attack.  Stroke.  Being very overweight (obese).  Diabetes.  Heart failure.  Irregular heartbeat. The goal of treatment is to help you breathe normally again. What are the causes? There are three kinds of sleep apnea:  Obstructive sleep apnea. This is caused by a blocked or collapsed airway.  Central sleep apnea. This happens when the brain does not send the right signals to the muscles that control breathing.  Mixed sleep apnea. This is a combination of obstructive and central sleep apnea. The most common cause of this condition is a collapsed or blocked airway. This can happen if:  Your throat muscles are too relaxed.  Your tongue and tonsils are too large.  You are overweight.  Your airway is too small. What increases the risk?  Being overweight.  Smoking.  Having a small airway.  Being older.  Being female.  Drinking alcohol.  Taking medicines to calm yourself (sedatives or tranquilizers).  Having family members with the condition. What are the signs or symptoms?  Trouble staying asleep.  Being sleepy or tired during the day.  Getting angry a lot.  Loud snoring.  Headaches in the morning.  Not being able to focus your mind (concentrate).  Forgetting things.  Less interest in sex.  Mood swings.  Personality changes.  Feelings of sadness (depression).  Waking up a lot during the night to pee (urinate).  Dry mouth.  Sore throat. How is this diagnosed?  Your medical history.  A physical exam.  A test that is done when you are sleeping (sleep study). The test is most often done in a sleep lab but may also be done at home. How is this treated?   Sleeping on your  side.  Using a medicine to get rid of mucus in your nose (decongestant).  Avoiding the use of alcohol, medicines to help you relax, or certain pain medicines (narcotics).  Losing weight, if needed.  Changing your diet.  Not smoking.  Using a machine to open your airway while you sleep, such as: ? An oral appliance. This is a mouthpiece that shifts your lower jaw forward. ? A CPAP device. This device blows air through a mask when you breathe out (exhale). ? An EPAP device. This has valves that you put in each nostril. ? A BPAP device. This device blows air through a mask when you breathe in (inhale) and breathe out.  Having surgery if other treatments do not work. It is important to get treatment for sleep apnea. Without treatment, it can lead to:  High blood pressure.  Coronary artery disease.  In men, not being able to have  an erection (impotence).  Reduced thinking ability. Follow these instructions at home: Lifestyle  Make changes that your doctor recommends.  Eat a healthy diet.  Lose weight if needed.  Avoid alcohol, medicines to help you relax, and some pain medicines.  Do not use any products that contain nicotine or tobacco, such as cigarettes, e-cigarettes, and chewing tobacco. If you need help quitting, ask your doctor. General instructions  Take over-the-counter and prescription medicines only as told by your doctor.  If you were given a machine to use while you sleep, use it only as told by your doctor.  If you are having surgery, make sure to tell your doctor you have sleep apnea. You may need to bring your device with you.  Keep all follow-up visits as told by your doctor. This is important. Contact a doctor if:  The machine that you were given to use during sleep bothers you or does not seem to be working.  You do not get better.  You get worse. Get help right away if:  Your chest hurts.  You have trouble breathing in enough air.  You have  an uncomfortable feeling in your back, arms, or stomach.  You have trouble talking.  One side of your body feels weak.  A part of your face is hanging down. These symptoms may be an emergency. Do not wait to see if the symptoms will go away. Get medical help right away. Call your local emergency services (911 in the [place] Do not drive yourself to the hospital. Summary  This condition affects breathing during sleep.  The most common cause is a collapsed or blocked airway.  The goal of treatment is to help you breathe normally while you sleep. This information is not intended to replace advice given to you by your health care provider. Make sure you discuss any questions you have with your health care provider. Document Released: 07/18/2008 Document Revised: 06/04/2018 Document Reviewed: 06/04/2018 Elsevier Interactive Patient Education  [employer].

## 2018-12-16 NOTE — Progress Notes (Signed)
PATIENT: Heather May DOB: 01/13/1946  REASON FOR VISIT: follow up HISTORY FROM: patient  Chief Complaint  Patient presents with  . Follow-up    Yearly follow up. Alone. New room. No new concerns at this time.      HISTORY OF PRESENT ILLNESS: Today 12/16/18 Heather May is a 73 y.o. 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  Compliance report dated 11/16/2018 through 12/15/2018 reveals that she is using BiPAP 29 out of 30 days for compliance 97%.  She is using her BiPAP for greater than 4 hours 23 out of 30 days for compliance of 77%.  Average usage is 5 hours and 32 minutes.  AHI was 4.7.  There is no significant leak.  She reports that she has done well since mask refitting.  She does note benefit from using BiPAP as she is less sleepy during the day.  She admits that she often goes to bed late and does not use the machine for greater than 4 hours.  She has been seen in the past for [facility] in 2014, seizure post SAH and lucunar infarct in 2019.  It was noted that Keppra should be discontinued however she has continued to take this medication.  She has not had any seizure activity and only one with SAH.[name] She was also diagnosed with Parkinson's around 2015 but was later told that she had an essential tremor.  She reports that she requested a second opinion after participating in a Parkinson's support group.  She did not feel that she had any symptoms related to Parkinson's with the exception of a head tremor.  Head tremor is worse when she is nervous or tired.  She has not noticed any changes with her gait or ability to move.  No concerns of memory loss this time.   HISTORY: (copied from Dr Dohmeier's note on 06/20/2018) HPI:  Heather May is a 73 y.o. female patient, seen here I her first RV after a new sleep study from 02-08-2018, Heather May had been referred to me by Dr. Jaynee Eagles, her primary care physician is Dr. Rachell Cipro, but it was her cardiologist Dr. Rayann Heman who follows her  for atrial fibrillation that recommended a sleep study.  The patient is status post watchman procedure and pacemaker implantation.  Her sleep study revealed severe COMPLEX sleep apnea at an AHI of 31.6/h;  during REM sleep the AHI was not increased, but there was a significant supine dependent sleep with an AHI of 34.4 versus a  nonsupine AHI of 13.7.  She did not have prolonged oxygen desaturations, but her sleep efficiency was rather poor she slept less than 60% of the recorded time.  There were at  baseline12 obstructive apneas, 3 central apneas and 14 mixed apneas.  The diagnosis was complex sleep apnea she was titrated to CPAP but central apneas emerged, she was then switched to a BiPAP function the sleep latency was only helpful minute once BiPAP was initiated and to sleep efficiency rose to 91.5%.  However they were now 39 central apneas, and 40 mixed apneas which only 20 obstructive apneas. I stated that the positive airway pressure titration was incomplete. Heather May returned on 18 Mar 2018 for a full night titration.  BiPAP was initiated at 10/6 centimeters water pressure and she switched to BiPAP ST.  She was then changed to ASV mode with a minimum pressure of 3, maximum pressure support of 15 and an end expiratory pressure of 5  cmH2O.  Here the AHI was reduced at 0.5/h.  She also used a small full facemask.  She states that she is still using a fissure and pico Simplus full facemask in small size.  I am able to review the first compliance download for this new patient on CPAP-ASV.  She is 100% compliant has used her machine 30 out of 30 days and only 3 of those days less than 4 hours, the still is a very high compliance by time over 90%, average use of time is 6 hours and 21 minutes at night, the patient uses a minimum EPAP of 5 and a minimum pressure support of 3 and a maximum pressure support of 15 cmH2O. Her apnea index is 2.4/h hypercapnia Cecila 3.7/h, she does not have major air leaks based  on these data I think this is the optimal treatment for her.  She also noted clinically how much she has improved since she has a functioning machine, over the last 2-1/2 months she has become much less daytime sleepy, she now can stay awake when watching a movie, her Epworth Sleepiness Scale is reduced to 2 points and her fatigue severity scale to 9 points.       01-10-2018, seen here in a referral from Dr. Jaynee Eagles, whose note I have copied below. 01-10-2018 .  Chief complaint according to patient : "The fall asleep a lot while watching TV in daytime, and at nighttime I may have hours before I can go to sleep" . She used to love going to the cinema but now has stopped because she " sleeps through "  Sleep habits are as follows: The patient spends her evening hours watching TV.  She does have trouble with ambulation and is using a walker that is seated, at home she will watch TV while seated in her favorite recliner, she estimates that she will fall asleep for a total time of about an hour while in front of the TV between 7 and 10 PM.  She will retreat to get ready for bed at about 1130 or midnight, probably late.  While in bed she has often trouble initiating sleep.  Her bedroom is described as cool, quiet and dark.  She is attached to a heart monitor at night but admits some blue light, and she does use a night light.  Once asleep she can stay asleep on average for 3 hours before she wakes up.  She does not know why she wakes up, there is no specific discomfort she does not usually have to go to the bathroom sometimes she has noted a dry mouth but this did not wake her.,  Her partner is using a CPAP and is usually asleep and has not witnessed her to have apnea or snoring.  However she has heard before that she is a snorer, and this has been known for many years. Her husband use to wake her when she stopped breathing - 15 years ago.  She rises at varius times, is retired, has no routine. She goes to  water gymnastics or water aerobics twice a week when she has to rise earlier, around 6.30 AM. The exercise begins at 9 AM.  All other days she stays longer in bed and sometimes until noon. She spends a lot of time in bed without sleeping , averaging still 9-10 hours. She does not schedule any naps for the daytime but she does frequently doze off when physically not active and mentally not stimulated.  She has  not fallen asleep while in a conversation but certainly in the waiting room, while reading.  Today's Epworth sleepiness score was endorsed at 13 points, fatigue severity at only 17 points and the geriatric depression score at 2 out of 15 points.  The patient does not endorsed tinnitus, nausea, palpitations, diaphoresis, headaches waking her .   Sleep medical history and family sleep history: She has polyneuropathy from diabetes (Last hgba1c in my records 8.2 10/2015, b12 and tsh normal 05/2016).  Patient has a past medical history of atrial fibrillation, CAD s/p stents, bradycardia s/p pacemaker, type 2 diabetes, hyperlipidemia, essential tremor, cerebral infarction, hypertension, peripheral vascular disease, spinal stenosis, pain in limb, seizure.She walks, go to the gym, go to movies, socially active. CT of the head She nods off a lot during the day and naps. She wakes at night frequently. But ESS only 5/10. She has dry mouth in the morning and difficulty swallowing. Occ wakes herself up snoring.   Social history: lives with a  partner, widowed 15 years ago. Husband had a PE.  She has one cat, retired since 2012 , from [employer]. Quit smoking over 30 years ago, not using ETOH, caffeine: iced tea made of herbal tea. Decaffeinated  coffee once a day.      Dr Cathren Laine notes:" Patient is her for a new problem, new stroke found on CT head.  Appears to be small-vessel disease due to location and size - however cannot rule out afib. Will send notes to Dr. Joylene Grapes and would not rule out  starting Eliquis in the future. Had a long discussion with patient and her partner, reviewed images and answered questions. She has had a watchman procedure with Dr. Rayann Heman , an umbrella protecting her from an apical thrombotic formation, placed in 2017 .  CT 12/22/2017: Abnormal CT scan of the head showing remote age right internal capsule infarct and mild age-appropriate changes of chronic microvascular ischemia and generalized cerebral atrophy. Compared to the CT head dated 06/30/2016 the right internal capsule infarct appears to be new  Interval history 12/11/2016: Patient has a new issue today -likely sleep apnea, cardiologist stated she should have a sleep eval. 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  Patient has a past medical history of atrial fibrillation, CAD s/p stents, bradycardia s/p pacemaker, type 2 diabetes, hyperlipidemia, essential tremor, cerebral infarction, hypertension, peripheral vascular disease, spinal stenosis, pain in limb, seizure. ESS 12. She is still on the keppra. Discussed discontinuing. She is excessively tired, no "ambition", she sits in her recliner all day and nods off, tired all the time, snoring. 6 weeks ago she was watching her grandchildren, she had just awakened from sleep, she felt all "floppy" and she sat inher recliner all day, a lot more fatigue, she thought the right side of her mouth was drooping for a few weeks but no one else saw it and nothing else focal she just felt fatigues for a few weeks. 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 CT head to eval for any new strokes.   REVIEW OF SYSTEMS: Out of a complete 14 system review of symptoms, the patient complains only of the following symptoms, none reported and all other reviewed systems are negative.  EPWORTH SLEEPINESS SCALE 10  ALLERGIES: Allergies  Allergen Reactions  . Hydrocodone Nausea And  Vomiting  . Sulfa Antibiotics     RASH  HOME MEDICATIONS: Outpatient Medications Prior to Visit  Medication Sig Dispense Refill  . benazepril (LOTENSIN) 10 MG tablet Take 10 mg by mouth daily.    . clopidogrel (PLAVIX) 75 MG tablet TAKE 1 TABLET(75 MG) BY MOUTH DAILY 30 tablet 11  . diltiazem (CARDIZEM) 30 MG tablet TAKE 1 TABLET(30 MG) BY MOUTH DAILY AS NEEDED 30 tablet 8  . glipiZIDE (GLUCOTROL) 5 MG tablet Take 5 mg by mouth daily after breakfast.   3  . lansoprazole (PREVACID) 30 MG capsule Take 30 mg by mouth daily at 12 noon.    . levETIRAcetam (KEPPRA) 500 MG tablet Take 500 mg by mouth daily.     [name] lovastatin (MEVACOR) 40 MG tablet Take 40 mg by mouth at bedtime.    . Multiple Vitamin (MULTIVITAMIN) tablet Take 1 tablet by mouth daily.    . ondansetron (ZOFRAN) 4 MG tablet Take 4 mg by mouth every 8 (eight) hours as needed for nausea or vomiting.    . SitaGLIPtin-MetFORMIN HCl (JANUMET XR) [phone] MG TB24 Take 1 tablet by mouth every evening.     No facility-administered medications prior to visit.     PAST MEDICAL HISTORY: Past Medical History:  Diagnosis Date  . CAD (coronary artery disease) 08/15/13  . Diabetes mellitus without complication ([place])   . Diverticulosis   . Essential tremor   . GERD (gastroesophageal reflux disease)   . History of long-term use of multiple prescription drugs   . Hyperlipidemia    mixed  . Hypertension   . Long-term (current) use of anticoagulants   . PAD (peripheral artery disease) ([place])   . Percutaneous transluminal coronary angioplasty status   . Permanent atrial fibrillation    chads2vasc score is at least 5  . Pulmonary embolism ([place])   . Pulmonary fibrosis ([place])   . Seizure ([place]) 2014   occured in setting of [place]  . Sick sinus syndrome ([place])   . Spinal stenosis   . Subarachnoid hemorrhage ([place])   . Transient ischemic attack     PAST SURGICAL HISTORY: Past Surgical History:  Procedure Laterality Date  . ABDOMINAL  HYSTERECTOMY  2004  . BASAL CELL CARCINOMA EXCISION    . CARDIAC CATHETERIZATION  10/14   PCI   . carpel tunnel surgery Bilateral 2006/ 2010  . CESAREAN SECTION N/A 1978   x 1  . HERNIA REPAIR  06/2010  . LEFT ATRIAL APPENDAGE OCCLUSION N/A 08/26/2015   Procedure: LEFT ATRIAL APPENDAGE OCCLUSION;  Surgeon: Thompson Grayer, MD;  Location: [place] [facility];  Service: Cardiovascular;  Laterality: N/A;  . PACEMAKER INSERTION  03/05/15   [name] Accolade DR implanted in [place]  . removal of big toe nail Bilateral   . TEE WITHOUT CARDIOVERSION N/A 08/16/2015   Procedure: TRANSESOPHAGEAL ECHOCARDIOGRAM (TEE);  Surgeon: Pixie Casino, MD;  Location: [facility] ENDOSCOPY;  Service: Cardiovascular;  Laterality: N/A;  . TEE WITHOUT CARDIOVERSION N/A 10/12/2015   Procedure: TRANSESOPHAGEAL ECHOCARDIOGRAM (TEE);  Surgeon: Pixie Casino, MD;  [facility] ENDOSCOPY;  Service: Cardiovascular;  Laterality: N/A;  . titanium pins inserted metatarsol bone  02/2012   2 pins placed     FAMILY HISTORY: Family History  Problem Relation Age of Onset  . Stroke Mother   . Epilepsy Mother   . Stroke Father   . Heart attack Father   . Atrial fibrillation Sister   . Atrial fibrillation Brother   . Diabetes Mellitus II Brother   . Pulmonary embolism Son   .  Colon cancer Neg Hx   . Esophageal cancer Neg Hx   . Stomach cancer Neg Hx   . Rectal cancer Neg Hx     SOCIAL HISTORY: Social History   Socioeconomic History  . Marital status: Widowed    Spouse name: Not on file  . Number of children: 5  . Years of education: 34  . Highest education level: Not on file  Occupational History  . Occupation: Retired  Scientific laboratory technician  . Financial resource strain: Not on file  . Food insecurity:    Worry: Not on file    Inability: Not on file  . Transportation needs:    Medical: Not on file    Non-medical: Not on file  Tobacco Use  . Smoking status: Former Smoker    Last attempt to quit:  07/06/2009    Years since quitting: 9.4  . Smokeless tobacco: Never Used  . Tobacco comment: didnt smoke much  Substance and Sexual Activity  . Alcohol use: No    Alcohol/week: 0.0 standard drinks    Comment: very infrequently  . Drug use: No  . Sexual activity: Not on file  Lifestyle  . Physical activity:    Days per week: Not on file    Minutes per session: Not on file  . Stress: Not on file  Relationships  . Social connections:    Talks on phone: Not on file    Gets together: Not on file    Attends religious service: Not on file    Active member of club or organization: Not on file    Attends meetings of clubs or organizations: Not on file    Relationship status: Not on file  . Intimate partner violence:    Fear of current or ex partner: Not on file    Emotionally abused: Not on file    Physically abused: Not on file    Forced sexual activity: Not on file  Other Topics Concern  . Not on file  Social History Narrative   Pt recently moved to [facility] [place].    Previously worked as a Teacher, music.   Caffeine use: only when eating out tea/soda   Right-handed   Lives at home with partner/roommate      PHYSICAL EXAM  Vitals:   12/16/18 1059  Weight: 212 lb 3.2 oz (96.3 kg)  Height: 5\' 6"  (1.676 m)   Body mass index is 34.25 kg/m.  Generalized: Well developed, in no acute distress  Cardiology: Irregularly irregular, no murmur noted Respiratory: Clear to auscultation bilaterally Neck circumference 15 inches, Mallampati 3+ Neurological examination  Mentation: Alert oriented to time, place, history taking. Follows all commands speech and language fluent Cranial nerve II-XII: Pupils were equal round reactive to light. Extraocular movements were full, visual field were full on confrontational test. Facial sensation and strength were normal. Uvula tongue midline. Head turning and shoulder shrug  were normal and symmetric. Motor: The motor testing  reveals 4 over 5 strength of all 4 extremities. Good symmetric motor tone is noted throughout. Mild head tremor noted  Sensory: Sensory testing is intact to soft touch on all 4 extremities. No evidence of extinction is noted.  Coordination: Cerebellar testing reveals good finger-nose-finger and heel-to-shin bilaterally.  Gait and station: Gait is guarded with rollator.  Reflexes: Deep tendon reflexes are decreased but symmetric bilaterally.   DIAGNOSTIC DATA (LABS, IMAGING, TESTING) - I reviewed patient records, labs, notes, testing and imaging myself where available.  No flowsheet  data found.   Lab Results  Component Value Date   WBC 6.3 10/13/2015   HGB 13.0 10/13/2015   HCT 39.6 10/13/2015   MCV 93.8 10/13/2015   PLT 211 10/13/2015      Component Value Date/Time   NA 136 10/13/2015 1053   K 4.2 10/13/2015 1053   CL 100 10/13/2015 1053   CO2 25 10/13/2015 1053   GLUCOSE 303 (H) 10/13/2015 1053   BUN 15 10/13/2015 1053   CREATININE 0.74 10/13/2015 1053   CALCIUM 9.5 10/13/2015 1053   PROT 7.2 06/14/2016 1145   GFRNONAA >60 08/27/2015 0347   GFRAA >60 08/27/2015 0347   No results found for: CHOL, HDL, LDLCALC, LDLDIRECT, TRIG, CHOLHDL No results found for: HGBA1C Lab Results  Component Value Date   VITAMINB12 648 06/14/2016   Lab Results  Component Value Date   TSH 1.470 06/14/2016   CT 12/22/2017: Abnormal CT scan of the head showing remote age right internal capsule infarct and mild age-appropriate changes of chronic microvascular ischemia and generalized cerebral atrophy. Compared to the CT head dated 06/30/2016 the right internal capsule infarct appears to be new   ASSESSMENT AND PLAN 73 y.o. year old female  has a past medical history of CAD (coronary artery disease) (08/15/13), Diabetes mellitus without complication ([place]), Diverticulosis, Essential tremor, GERD (gastroesophageal reflux disease), History of long-term use of multiple prescription drugs, Hyperlipidemia,  Hypertension, Long-term (current) use of anticoagulants, PAD (peripheral artery disease) ([place]), Percutaneous transluminal coronary angioplasty status, Permanent atrial fibrillation, Pulmonary embolism ([place]), Pulmonary fibrosis ([place]), Seizure ([place]) (2014), Sick sinus syndrome ([place]), Spinal stenosis, Subarachnoid hemorrhage ([place]), and Transient ischemic attack. here with     ICD-10-CM   1. Complex sleep apnea syndrome G47.31   2. Essential tremor G25.0   3. History of seizure Z87.898   4. History of subarachnoid hemorrhage Z86.79     Overall she is doing well.  Compliance report reveals optimal usage of BiPAP.  She was educated on risk factors of untreated sleep apnea and encouraged to use BiPAP every night.  We have also discussed the need for usage greater than 4 hours each night.  I have discussed her case with Dr. Lavell Anchors who suggest we stop Keppra at today's visit.  Patient reports she was unaware of the recommendation to stop Keppra at previous visits.  Patient was advised to monitor symptoms very closely and use caution with driving.  We have discussed potential to have another seizure.  Patient is in agreement stopping Keppra and will monitor symptoms closely.  We will continue to monitor tremor as well.  We have discussed treatment options with medications.  Patient wishes to hold off for now.  She was advised to follow-up in 1 year for BiPAP compliance download, sooner if needed.   No orders of the defined types were placed in this encounter.    No orders of the defined types were placed in this encounter.     I spent 15 minutes with the patient. 50% of this time was spent counseling and educating patient on plan of care and medications.    Debbora Presto, FNP-C 12/16/2018, 11:05 AM [facility] [address] [place], [place] [address] [phone]

## 2018-12-18 NOTE — Progress Notes (Signed)
Made any corrections needed, and agree with history, physical, neuro exam,assessment and plan as stated.  Antonia Ahern, MD 

## 2018-12-23 ENCOUNTER — Ambulatory Visit: Payer: Medicare Other | Admitting: Neurology

## 2018-12-31 ENCOUNTER — Encounter: Payer: Self-pay | Admitting: Cardiology

## 2018-12-31 ENCOUNTER — Ambulatory Visit (INDEPENDENT_AMBULATORY_CARE_PROVIDER_SITE_OTHER): Payer: Medicare Other | Admitting: Cardiology

## 2018-12-31 VITALS — BP 132/70 | HR 60 | Ht 66.0 in | Wt 218.0 lb

## 2018-12-31 DIAGNOSIS — Z950 Presence of cardiac pacemaker: Secondary | ICD-10-CM | POA: Diagnosis not present

## 2018-12-31 DIAGNOSIS — I4821 Permanent atrial fibrillation: Secondary | ICD-10-CM

## 2018-12-31 DIAGNOSIS — I2510 Atherosclerotic heart disease of native coronary artery without angina pectoris: Secondary | ICD-10-CM | POA: Diagnosis not present

## 2018-12-31 DIAGNOSIS — I6381 Other cerebral infarction due to occlusion or stenosis of small artery: Secondary | ICD-10-CM | POA: Diagnosis not present

## 2018-12-31 DIAGNOSIS — I10 Essential (primary) hypertension: Secondary | ICD-10-CM
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
## 2018-12-31 NOTE — Patient Instructions (Signed)
Medication Instructions:  The current medical regimen is effective;  continue present plan and medications.  If you need a refill on your cardiac medications before your next appointment, please call your pharmacy.   Follow-Up: At [facility], you and your health needs are our priority.  As part of our continuing mission to provide you with exceptional heart care, we have created designated Provider Care Teams.  These Care Teams include your primary Cardiologist (physician) and Advanced Practice Providers (APPs -  Physician Assistants and Nurse Practitioners) who all work together to provide you with the care you need, when you need it. You will need a follow up appointment in 12 months.  Please call our office 2 months in advance to schedule this appointment.  You may see Mark Skains, MD or one of the following Advanced Practice Providers on your designated Care Team:   Lori Gerhardt, NP Laura Ingold, NP . Jill McDaniel, NP  Thank you for choosing [facility]!!      

## 2019-01-16 ENCOUNTER — Encounter: Payer: Self-pay | Admitting: *Deleted

## 2019-01-21 ENCOUNTER — Other Ambulatory Visit: Payer: Self-pay

## 2019-01-21 ENCOUNTER — Ambulatory Visit (INDEPENDENT_AMBULATORY_CARE_PROVIDER_SITE_OTHER): Payer: Medicare Other | Admitting: *Deleted

## 2019-01-21 DIAGNOSIS — R001 Bradycardia, unspecified: Secondary | ICD-10-CM

## 2019-01-21 LAB — CUP PACEART REMOTE DEVICE CHECK
Battery Remaining Longevity: 60 mo
Battery Remaining Percentage: 96 %
Brady Statistic RA Percent Paced: 0 %
Brady Statistic RV Percent Paced: 72 %
Date Time Interrogation Session: 20200331084200
Implantable Pulse Generator Implant Date: 20160513
Lead Channel Impedance Value: 359 Ohm
Lead Channel Impedance Value: 631 Ohm
Lead Channel Pacing Threshold Amplitude: 1.6 V
Lead Channel Pacing Threshold Pulse Width: 0.4 ms
Lead Channel Setting Pacing Amplitude: 2.1 V
Lead Channel Setting Pacing Pulse Width: 0.4 ms
Lead Channel Setting Sensing Sensitivity: 0.6 mV
Pulse Gen Serial Number: 732886

## 2019-01-29 ENCOUNTER — Encounter: Payer: Self-pay | Admitting: Cardiology

## 2019-01-29 NOTE — Progress Notes (Signed)
Remote pacemaker transmission.   

## 2019-02-27 DIAGNOSIS — I10 Essential (primary) hypertension: Secondary | ICD-10-CM | POA: Diagnosis not present

## 2019-02-27 DIAGNOSIS — M170 Bilateral primary osteoarthritis of knee: Secondary | ICD-10-CM | POA: Diagnosis not present

## 2019-03-04 DIAGNOSIS — I10 Essential (primary) hypertension: Secondary | ICD-10-CM | POA: Diagnosis not present

## 2019-03-04 DIAGNOSIS — E119 Type 2 diabetes mellitus without complications: Secondary | ICD-10-CM | POA: Diagnosis not present

## 2019-03-04 DIAGNOSIS — R600 Localized edema: Secondary | ICD-10-CM | POA: Diagnosis not present

## 2019-03-14 DIAGNOSIS — R197 Diarrhea, unspecified: Secondary | ICD-10-CM | POA: Diagnosis not present

## 2019-03-14 DIAGNOSIS — E119 Type 2 diabetes mellitus without complications: Secondary | ICD-10-CM | POA: Diagnosis not present

## 2019-03-14 DIAGNOSIS — I10 Essential (primary) hypertension: Secondary | ICD-10-CM | POA: Diagnosis not present

## 2019-03-28 DIAGNOSIS — I10 Essential (primary) hypertension: Secondary | ICD-10-CM | POA: Diagnosis not present

## 2019-03-28 DIAGNOSIS — I519 Heart disease, unspecified: Secondary | ICD-10-CM | POA: Diagnosis not present

## 2019-03-28 DIAGNOSIS — E1165 Type 2 diabetes mellitus with hyperglycemia: Secondary | ICD-10-CM | POA: Diagnosis not present

## 2019-04-18 DIAGNOSIS — E119 Type 2 diabetes mellitus without complications: Secondary | ICD-10-CM | POA: Diagnosis not present

## 2019-04-18 DIAGNOSIS — I10 Essential (primary) hypertension: Secondary | ICD-10-CM | POA: Diagnosis not present

## 2019-04-18 DIAGNOSIS — M170 Bilateral primary osteoarthritis of knee: Secondary | ICD-10-CM | POA: Diagnosis not present

## 2019-04-22 ENCOUNTER — Ambulatory Visit (INDEPENDENT_AMBULATORY_CARE_PROVIDER_SITE_OTHER): Payer: Medicare Other | Admitting: *Deleted

## 2019-04-22 DIAGNOSIS — R001 Bradycardia, unspecified: Secondary | ICD-10-CM

## 2019-04-22 LAB — CUP PACEART REMOTE DEVICE CHECK
Battery Remaining Longevity: 54 mo
Battery Remaining Percentage: 92 %
Brady Statistic RA Percent Paced: 0 %
Brady Statistic RV Percent Paced: 73 %
Date Time Interrogation Session: 20200630084000
Implantable Pulse Generator Implant Date: 20160513
Lead Channel Impedance Value: 356 Ohm
Lead Channel Impedance Value: 635 Ohm
Lead Channel Pacing Threshold Amplitude: 2 V
Lead Channel Pacing Threshold Pulse Width: 0.4 ms
Lead Channel Setting Pacing Amplitude: 3.5 V
Lead Channel Setting Pacing Pulse Width: 0.4 ms
Lead Channel Setting Sensing Sensitivity: 0.6 mV
Pulse Gen Serial Number: 732886

## 2019-04-28 DIAGNOSIS — Z1159 Encounter for screening for other viral diseases: Secondary | ICD-10-CM | POA: Diagnosis not present

## 2019-04-30 DIAGNOSIS — M170 Bilateral primary osteoarthritis of knee: Secondary | ICD-10-CM | POA: Diagnosis not present

## 2019-05-02 ENCOUNTER — Telehealth: Payer: Self-pay | Admitting: *Deleted

## 2019-05-02 NOTE — Telephone Encounter (Signed)
Heather May 11/06/1945 [id number]  Dear Dr. Jaynee Eagles:  We are needing to  schedule the above named patient for a colonsocopy procedure. Our records show that she is on anticoagulation therapy of Plavix.  Please advise as to whether the patient may come off their therapy of Plavix 5 days prior to their procedure. If you approve this, we will direct book her colonoscopy with Dr. Wilfrid Lund.    In 2019, she held her Plavix x 5 days and stayed on an 81 mg ASA.  Please route your response to [name] RN  or fax response to [phone].  Sincerely,   [facility]

## 2019-05-02 NOTE — Telephone Encounter (Signed)
That would be fine for her to come off of Plavix and just be on asa81mg  for 5 days, I think there is a very low risk of anything happening over these 5 days. Thank you for asking! Dr. Jaynee Eagles

## 2019-05-02 NOTE — Telephone Encounter (Signed)
Called pt and scheduled a colon for 05-23-19 at 130 pm with Dr Loletha Carrow- we will follow these Plavix hold instructions- PV 05-12-19 Monday at 130 pm- Lelan Pons PV

## 2019-05-04 ENCOUNTER — Encounter: Payer: Self-pay | Admitting: Cardiology

## 2019-05-04 NOTE — Progress Notes (Signed)
Remote pacemaker transmission.   

## 2019-05-05 DIAGNOSIS — E1165 Type 2 diabetes mellitus with hyperglycemia: Secondary | ICD-10-CM | POA: Diagnosis not present

## 2019-05-05 DIAGNOSIS — E663 Overweight: Secondary | ICD-10-CM | POA: Diagnosis not present

## 2019-05-05 DIAGNOSIS — M170 Bilateral primary osteoarthritis of knee: Secondary | ICD-10-CM | POA: Diagnosis not present

## 2019-05-12 ENCOUNTER — Ambulatory Visit (AMBULATORY_SURGERY_CENTER): Payer: Self-pay | Admitting: *Deleted

## 2019-05-12 ENCOUNTER — Other Ambulatory Visit: Payer: Self-pay

## 2019-05-12 VITALS — Ht 66.0 in | Wt 215.0 lb

## 2019-05-12 DIAGNOSIS — Z86010 Personal history of colonic polyps: Secondary | ICD-10-CM

## 2019-05-12 MED ORDER — NA SULFATE-K SULFATE-MG SULF 17.5-3.13-1.6 GM/177ML PO SOLN: 1.0000 | Freq: Once | ORAL | 0 refills | Status: AC

## 2019-05-12 NOTE — Progress Notes (Signed)

## 2019-05-14 DIAGNOSIS — M170 Bilateral primary osteoarthritis of knee: Secondary | ICD-10-CM | POA: Diagnosis not present

## 2019-05-16 DIAGNOSIS — E119 Type 2 diabetes mellitus without complications: Secondary | ICD-10-CM | POA: Diagnosis not present

## 2019-05-16 DIAGNOSIS — E162 Hypoglycemia, unspecified: Secondary | ICD-10-CM | POA: Diagnosis not present

## 2019-05-19 DIAGNOSIS — E162 Hypoglycemia, unspecified: Secondary | ICD-10-CM | POA: Diagnosis not present

## 2019-05-19 DIAGNOSIS — E119 Type 2 diabetes mellitus without complications: Secondary | ICD-10-CM | POA: Diagnosis not present

## 2019-05-20 ENCOUNTER — Telehealth: Payer: Self-pay | Admitting: Gastroenterology

## 2019-05-20 NOTE — Telephone Encounter (Signed)
Patient is scheduled for a procedure on Friday 7/31 and would like to know if she also needs to hold her pioglitazone medication.

## 2019-05-20 NOTE — Telephone Encounter (Signed)
_ X _   ORAL DIABETIC MEDICATION INSTRUCTIONS  The day before your procedure:  Take your diabetic pill as you do normally  The day of your procedure:  Do not take your diabetic pill   We will check your blood sugar levels during the admission process and again in Recovery before discharging you home  Pt has been notified and aware on diabetic medication instructions

## 2019-05-21 ENCOUNTER — Other Ambulatory Visit: Payer: Self-pay

## 2019-05-21 DIAGNOSIS — Z23 Encounter for immunization: Secondary | ICD-10-CM | POA: Diagnosis not present

## 2019-05-21 DIAGNOSIS — M170 Bilateral primary osteoarthritis of knee: Secondary | ICD-10-CM | POA: Diagnosis not present

## 2019-05-22 ENCOUNTER — Telehealth: Payer: Self-pay | Admitting: Gastroenterology

## 2019-05-22 NOTE — Telephone Encounter (Signed)
No all answer

## 2019-05-22 NOTE — Telephone Encounter (Signed)
Left message for patient regarding Covid-19 screening questions. °Covid-19 Screening Questions: °  °Do you now or have you had a fever in the last 14 days?  °  °Do you have any respiratory symptoms of shortness of breath or cough now or in the last 14 days?  °  °Do you have any family members or close contacts with diagnosed or suspected Covid-19 in the past 14 days?  °  °Have you been tested for Covid-19 and found to be positive?  °  ° °

## 2019-05-23 ENCOUNTER — Other Ambulatory Visit: Payer: Self-pay

## 2019-05-23 ENCOUNTER — Encounter: Payer: Medicare Other | Admitting: Gastroenterology

## 2019-05-23 ENCOUNTER — Ambulatory Visit (AMBULATORY_SURGERY_CENTER): Payer: Medicare Other | Admitting: Gastroenterology

## 2019-05-23 ENCOUNTER — Encounter: Payer: Self-pay | Admitting: Gastroenterology

## 2019-05-23 VITALS — BP 118/73 | HR 60 | Temp 98.1°F | Resp 14 | Ht 68.0 in | Wt 218.0 lb

## 2019-05-23 DIAGNOSIS — I4891 Unspecified atrial fibrillation: Secondary | ICD-10-CM | POA: Diagnosis not present

## 2019-05-23 DIAGNOSIS — Z86010 Personal history of colonic polyps: Secondary | ICD-10-CM | POA: Diagnosis not present

## 2019-05-23 DIAGNOSIS — D124 Benign neoplasm of descending colon: Secondary | ICD-10-CM | POA: Diagnosis not present

## 2019-05-23 DIAGNOSIS — D123 Benign neoplasm of transverse colon: Secondary | ICD-10-CM | POA: Diagnosis not present

## 2019-05-23 MED ORDER — SODIUM CHLORIDE 0.9 % IV SOLN: 500.0000 mL | Freq: Once | INTRAVENOUS | Status: DC

## 2019-05-23 NOTE — Progress Notes (Signed)
Report given to PACU, vss 

## 2019-05-23 NOTE — Progress Notes (Signed)
PJ temperature/courtney vitals.

## 2019-05-23 NOTE — Op Note (Addendum)
[facility] Patient Name: Heather May Procedure Date: 05/23/2019 1:39 PM MRN: [id number] Endoscopist: Mallie Mussel L. Loletha Carrow , MD Age: 73 Referring MD:  Date of Birth: Sep 04, 1946 Gender: Female Account #: [id number] Procedure:                Colonoscopy Indications:              Increased risk colon cancer surveillance: Personal                            history of multiple (3 or more) adenomas (11                            adenomatos and serrated polyps, including an                            advanced adenoma removed by EMR in 04/2018) Medicines:                Monitored Anesthesia Care Procedure:                Pre-Anesthesia Assessment:                           - Prior to the procedure, a History and Physical                            was performed, and patient medications and                            allergies were reviewed. The patient's tolerance of                            previous anesthesia was also reviewed. The risks                            and benefits of the procedure and the sedation                            options and risks were discussed with the patient.                            All questions were answered, and informed consent                            was obtained. Prior Anticoagulants: The patient has                            taken Plavix (clopidogrel), last dose was 5 days                            prior to procedure. ASA Grade Assessment: III - A                            patient with severe systemic disease. After  reviewing the risks and benefits, the patient was                            deemed in satisfactory condition to undergo the                            procedure.                           After obtaining informed consent, the colonoscope                            was passed under direct vision. Throughout the                            procedure, the patient's blood pressure, pulse, and               oxygen saturations were monitored continuously. The                            Colonoscope was introduced through the anus and                            advanced to the the cecum, identified by                            appendiceal orifice and ileocecal valve. The                            patient tolerated the procedure well. The                            colonoscopy was somewhat difficult due to a                            redundant colon. The quality of the bowel                            preparation was fair. Scope In: 1:51:37 PM Scope Out: 2:16:46 PM Scope Withdrawal Time: 0 hours 16 minutes 16 seconds  Total Procedure Duration: 0 hours 25 minutes 9 seconds  Findings:                 The digital rectal exam findings include decreased                            sphincter tone.                           Three sessile polyps were found in the transverse                            colon. The polyps were 3 to 5 mm in size. These  polyps were removed with a cold snare. Resection                            and retrieval were complete.                           A 4 mm polyp was found in the descending colon. The                            polyp was sessile. The polyp was removed with a                            cold snare. Resection and retrieval were complete.                           Many diverticula were found in the left colon and                            right colon (L>R, with tortuosity)                           Retroflexion in the rectum was not performed due to                            anatomy.                           The exam was otherwise without abnormality. Complications:            No immediate complications. Estimated Blood Loss:     Estimated blood loss was minimal. Impression:               - Decreased sphincter tone found on digital rectal                            exam.                           - Three 3 to 5 mm  polyps in the transverse colon,                            removed with a cold snare. Resected and retrieved.                           - One 4 mm polyp in the descending colon, removed                            with a cold snare. Resected and retrieved.                           - Diverticulosis in the left colon and in the right                            colon.                           -  The examination was otherwise normal. Recommendation:           - Patient has a contact number available for                            emergencies. The signs and symptoms of potential                            delayed complications were discussed with the                            patient. Return to normal activities tomorrow.                            Written discharge instructions were provided to the                            patient.                           - Resume previous diet.                           - Continue present medications.                           - Await pathology results.                           - Repeat colonoscopy is recommended for                            surveillance. The colonoscopy date will be                            determined after pathology results from today's                            exam become available for review.                           - Resume Plavix (clopidogrel) at prior dose                            tomorrow. Tredarius Cobern L. Loletha Carrow, MD 05/23/2019 2:25:14 PM This report has been signed electronically.

## 2019-05-23 NOTE — Patient Instructions (Signed)
Discharge instructions given. Handouts on polyps and diverticulosis. Resume plavix tomorrow. Resume other medications today. YOU HAD AN ENDOSCOPIC PROCEDURE TODAY AT [facility]:   Refer to the procedure report that was given to you for any specific questions about what was found during the examination.  If the procedure report does not answer your questions, please call your gastroenterologist to clarify.  If you requested that your care partner not be given the details of your procedure findings, then the procedure report has been included in a sealed envelope for you to review at your convenience later.  YOU SHOULD EXPECT: Some feelings of bloating in the abdomen. Passage of more gas than usual.  Walking can help get rid of the air that was put into your GI tract during the procedure and reduce the bloating. If you had a lower endoscopy (such as a colonoscopy or flexible sigmoidoscopy) you may notice spotting of blood in your stool or on the toilet paper. If you underwent a bowel prep for your procedure, you may not have a normal bowel movement for a few days.  Please Note:  You might notice some irritation and congestion in your nose or some drainage.  This is from the oxygen used during your procedure.  There is no need for concern and it should clear up in a day or so.  SYMPTOMS TO REPORT IMMEDIATELY:   Following lower endoscopy (colonoscopy or flexible sigmoidoscopy):  Excessive amounts of blood in the stool  Significant tenderness or worsening of abdominal pains  Swelling of the abdomen that is new, acute  Fever of 100F or higher   For urgent or emergent issues, a gastroenterologist can be reached at any hour by calling [phone].   DIET:  We do recommend a small meal at first, but then you may proceed to your regular diet.  Drink plenty of fluids but you should avoid alcoholic beverages for 24 hours.  ACTIVITY:  You should plan to take it easy for the rest of  today and you should NOT DRIVE or use heavy machinery until tomorrow (because of the sedation medicines used during the test).    FOLLOW UP: Our staff will call the number listed on your records 48-72 hours following your procedure to check on you and address any questions or concerns that you may have regarding the information given to you following your procedure. If we do not reach you, we will leave a message.  We will attempt to reach you two times.  During this call, we will ask if you have developed any symptoms of COVID 19. If you develop any symptoms (ie: fever, flu-like symptoms, shortness of breath, cough etc.) before then, please call [phone].  If you test positive for Covid 19 in the 2 weeks post procedure, please call and report this information to [place].    If any biopsies were taken you will be contacted by phone or by letter within the next 1-3 weeks.  Please call us at [phone] if you have not heard about the biopsies in 3 weeks.    SIGNATURES/CONFIDENTIALITY: You and/or your care partner have signed paperwork which will be entered into your electronic medical record.  These signatures attest to the fact that that the information above on your After Visit Summary has been reviewed and is understood.  Full responsibility of the confidentiality of this discharge information lies with you and/or your care-partner.

## 2019-05-23 NOTE — Progress Notes (Signed)
Called to room to assist during endoscopic procedure.  Patient ID and intended procedure confirmed with present staff. Received instructions for my participation in the procedure from the performing physician.  

## 2019-05-23 NOTE — Progress Notes (Signed)
Pt's states no medical or surgical changes since previsit or office visit. 

## 2019-05-27 ENCOUNTER — Encounter: Payer: Self-pay | Admitting: Gastroenterology

## 2019-05-28 ENCOUNTER — Telehealth: Payer: Self-pay | Admitting: *Deleted

## 2019-05-28 DIAGNOSIS — M170 Bilateral primary osteoarthritis of knee: Secondary | ICD-10-CM | POA: Diagnosis not present

## 2019-05-28 NOTE — Telephone Encounter (Signed)
1. Have you developed a fever since your procedure? no  2.   Have you had an respiratory symptoms (SOB or cough) since your procedure? no  3.   Have you tested positive for COVID 19 since your procedure no  4.   Have you had any family members/close contacts diagnosed with the COVID 19 since your procedure?  no   If yes to any of these questions please route to Joylene John, RN and Alphonsa Gin, Therapist, sports.     Follow up Call-  Call back number 05/23/2019 05/15/2018  Post procedure Call Back phone  # [phone]  Permission to leave phone message Yes Yes  Some recent data might be hidden     Patient questions:  Do you have a fever, pain , or abdominal swelling? No. Pain Score  0 *  Have you tolerated food without any problems? Yes.    Have you been able to return to your normal activities? Yes.    Do you have any questions about your discharge instructions: Diet   No. Medications  No. Follow up visit  No.  Do you have questions or concerns about your Care? No.  Actions: * If pain score is 4 or above: No action needed, pain <4.

## 2019-06-03 IMAGING — CR DG KNEE COMPLETE 4+V*R*
4 series · 4 of 4 positions shown · non-contrast
Comparison: None.

CLINICAL DATA: Chronic bilateral knee pain for several years.

EXAM:
RIGHT KNEE - COMPLETE 4+ VIEW

[w knee ap right]
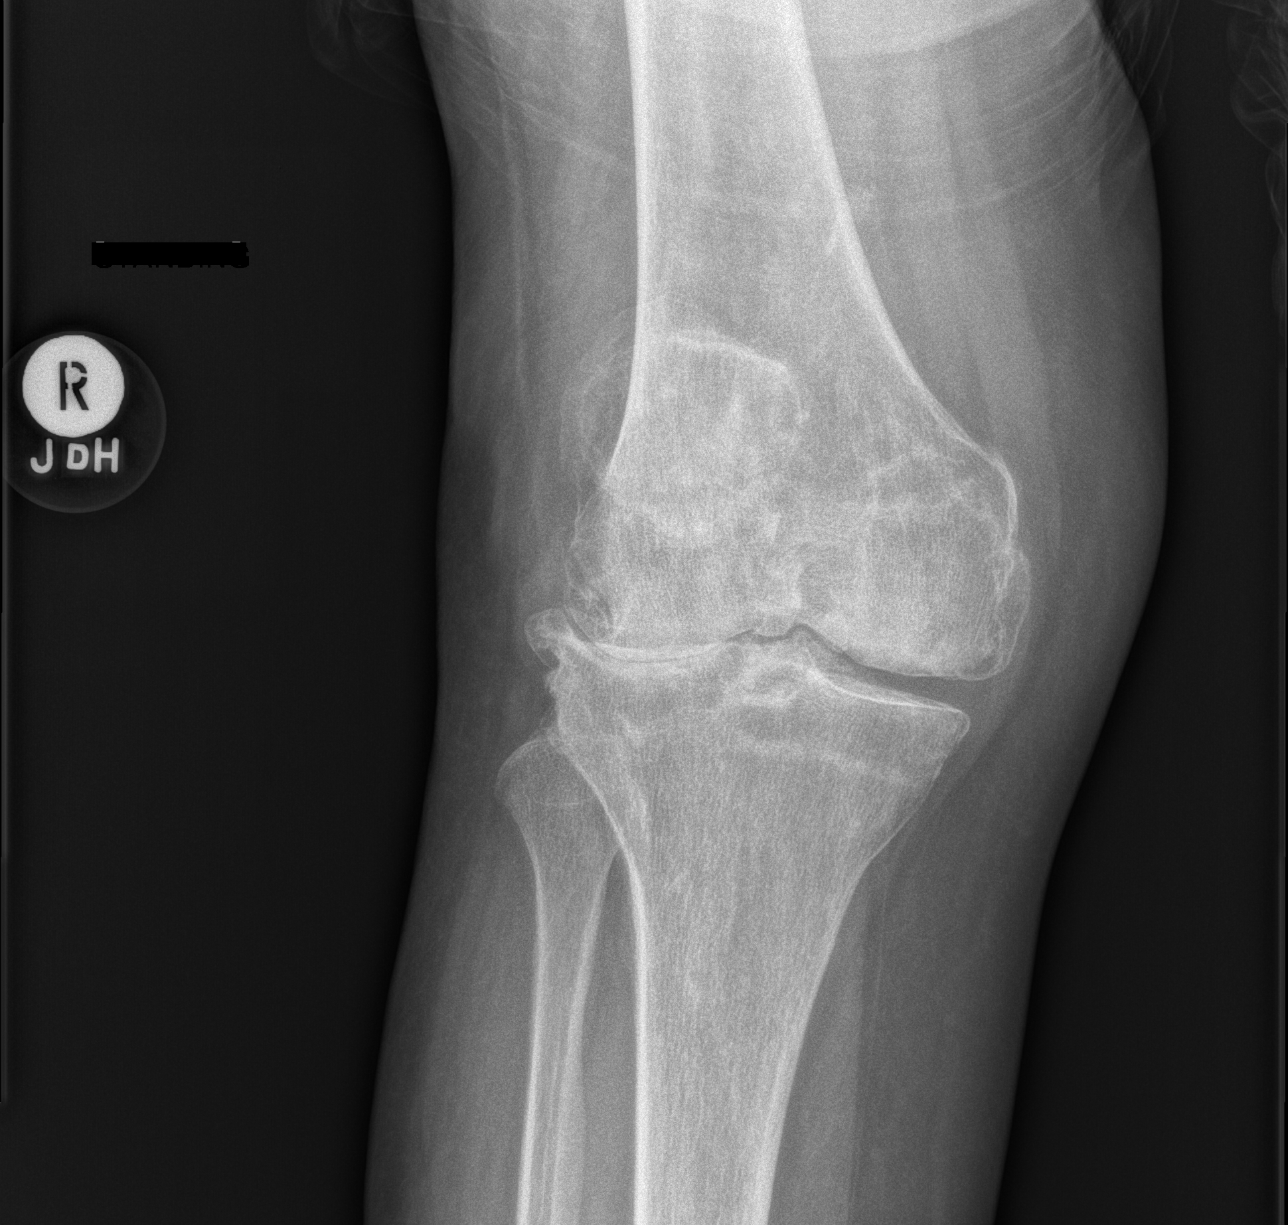

[w knee lat right]
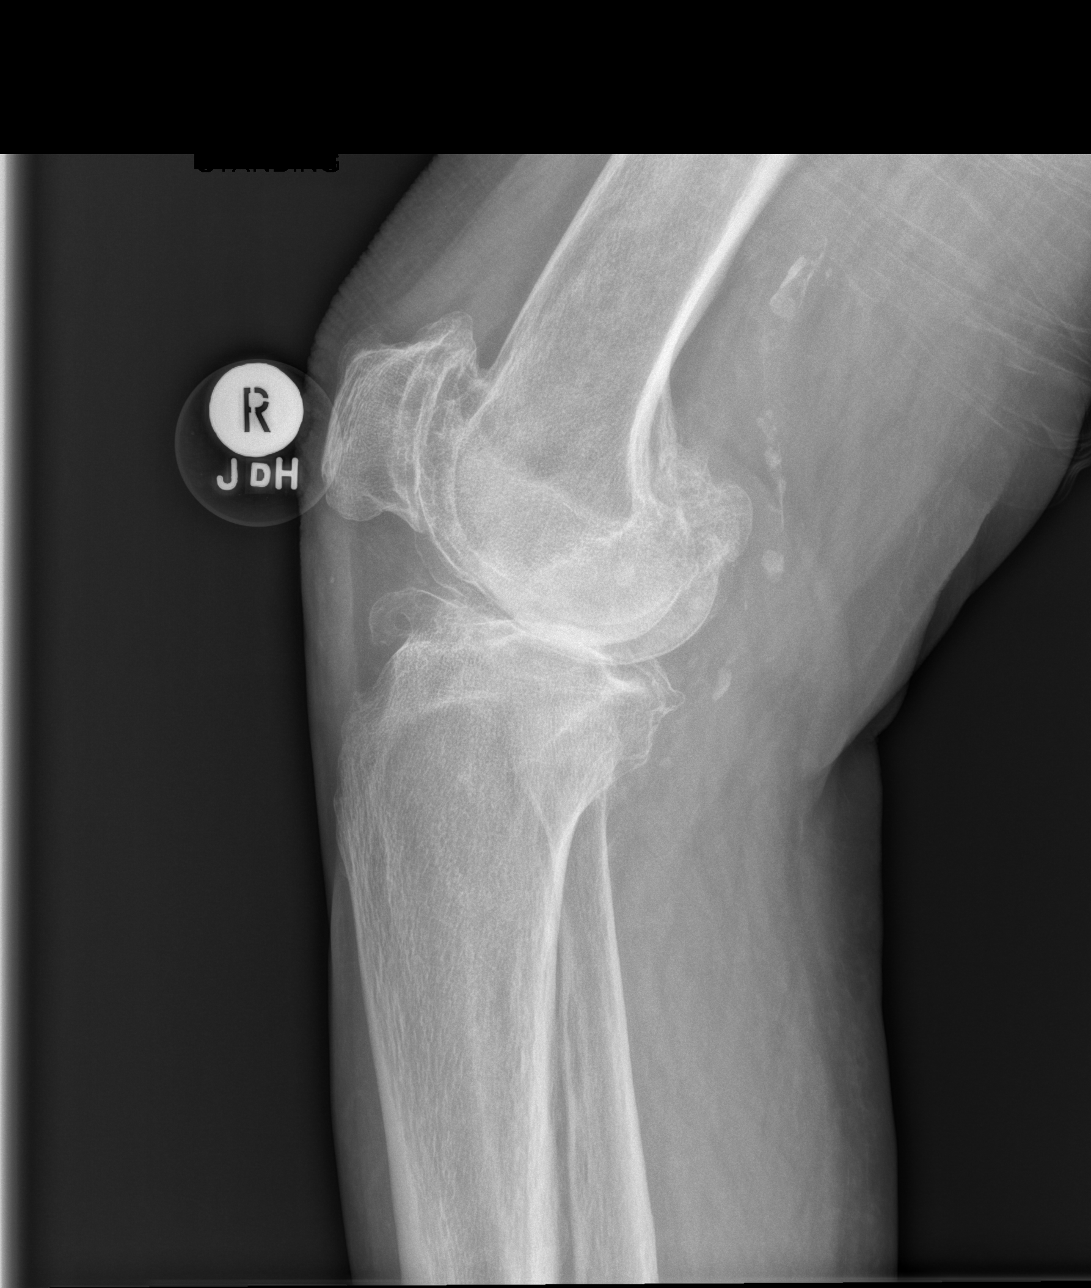

[x knee sunrise right]
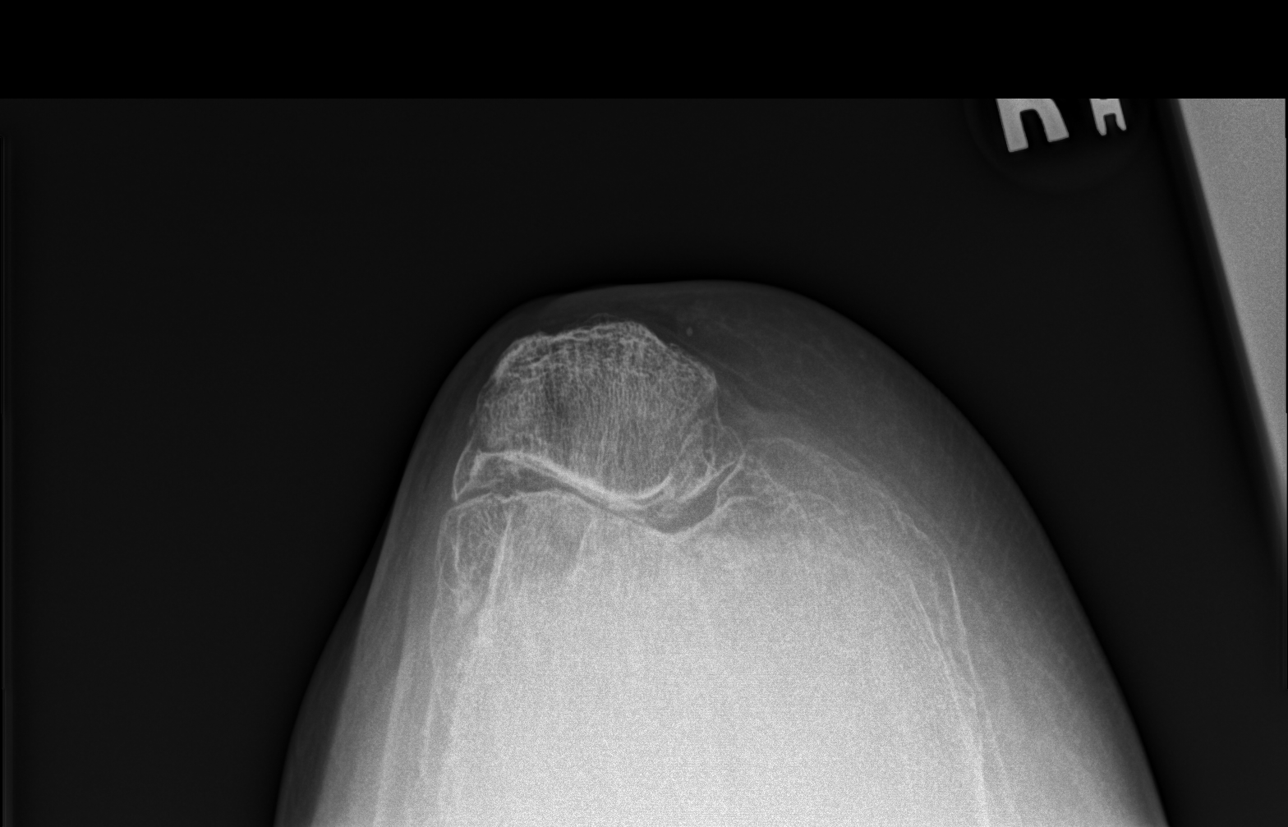

[x knee tunnel right]
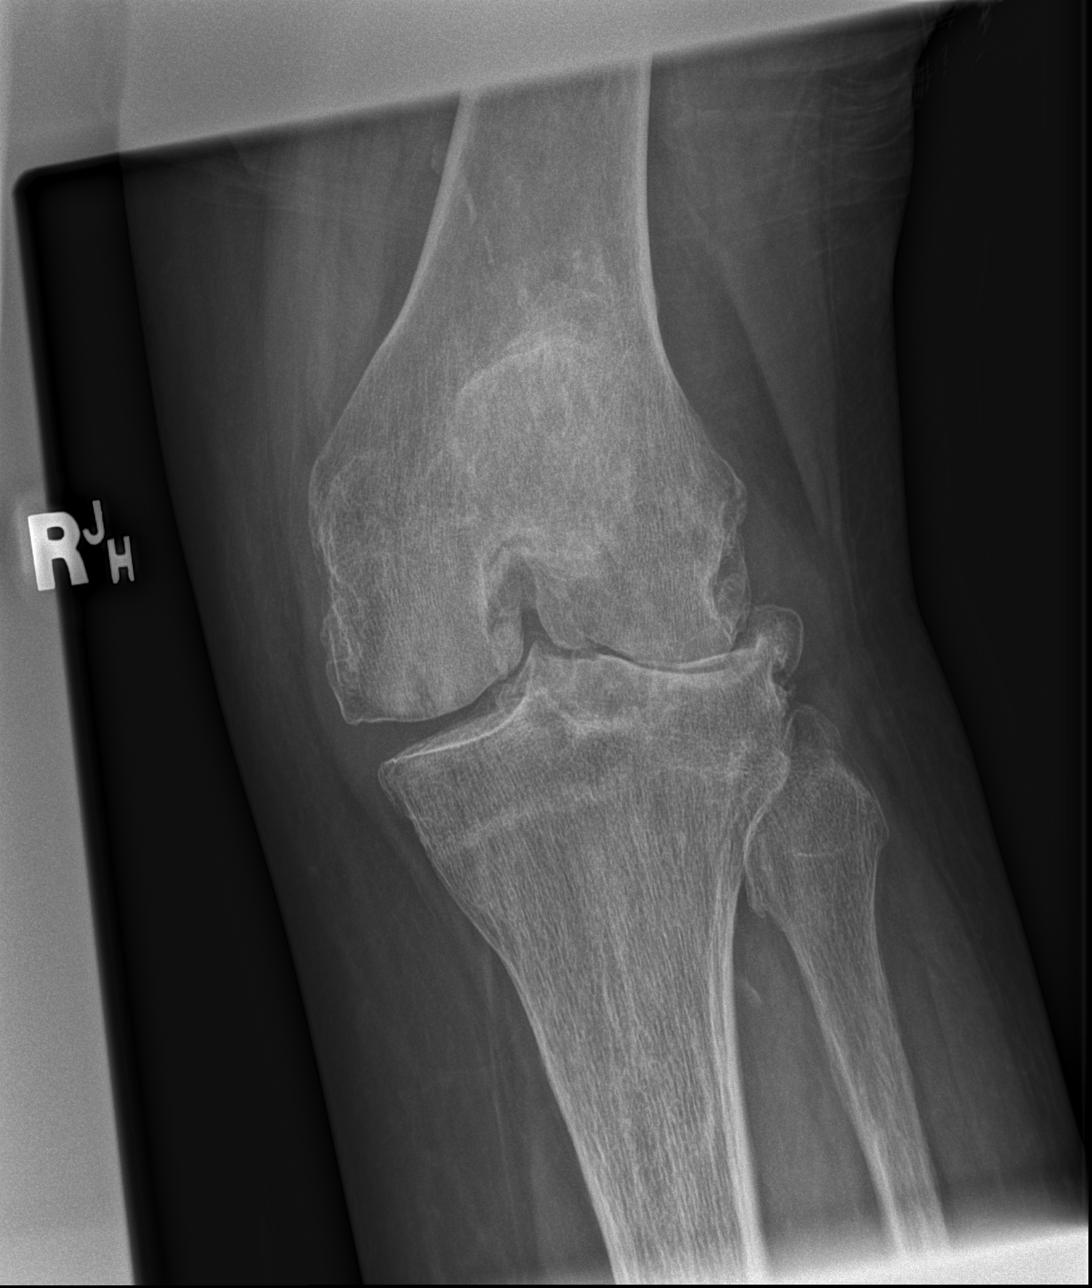

[4 of 4 positions shown; findings below may reference images not displayed]

FINDINGS: There is no evidence of acute fracture or dislocation.

Joint space loss, osteophytosis and juxta articular sclerosis noted,
severe in the lateral and patellofemoral compartments.

No suspicious focal bony lesions identified.
IMPRESSION: No evidence of acute abnormality.

Tricompartmental degenerative changes, severe in the lateral and
patellofemoral compartments.

## 2019-06-11 ENCOUNTER — Encounter: Payer: Self-pay | Admitting: *Deleted

## 2019-06-19 ENCOUNTER — Other Ambulatory Visit: Payer: Self-pay | Admitting: Family Medicine

## 2019-06-19 DIAGNOSIS — Z1231 Encounter for screening mammogram for malignant neoplasm of breast: Secondary | ICD-10-CM

## 2019-06-25 DIAGNOSIS — Z23 Encounter for immunization: Secondary | ICD-10-CM | POA: Diagnosis not present

## 2019-07-22 ENCOUNTER — Ambulatory Visit (INDEPENDENT_AMBULATORY_CARE_PROVIDER_SITE_OTHER): Payer: Medicare Other | Admitting: *Deleted

## 2019-07-22 DIAGNOSIS — I4819 Other persistent atrial fibrillation: Secondary | ICD-10-CM | POA: Diagnosis not present

## 2019-07-23 LAB — CUP PACEART REMOTE DEVICE CHECK
Battery Remaining Longevity: 54 mo
Battery Remaining Percentage: 87 %
Brady Statistic RA Percent Paced: 0 %
Brady Statistic RV Percent Paced: 74 %
Date Time Interrogation Session: 20200929084100
Implantable Pulse Generator Implant Date: 20160513
Lead Channel Impedance Value: 360 Ohm
Lead Channel Impedance Value: 591 Ohm
Lead Channel Pacing Threshold Amplitude: 2.1 V
Lead Channel Pacing Threshold Pulse Width: 0.4 ms
Lead Channel Setting Pacing Amplitude: 2.5 V
Lead Channel Setting Pacing Pulse Width: 0.4 ms
Lead Channel Setting Sensing Sensitivity: 0.6 mV
Pulse Gen Serial Number: 732886

## 2019-08-01 NOTE — Progress Notes (Signed)
Remote pacemaker transmission.   

## 2019-08-04 DIAGNOSIS — E119 Type 2 diabetes mellitus without complications: Secondary | ICD-10-CM | POA: Diagnosis not present

## 2019-08-04 DIAGNOSIS — E7800 Pure hypercholesterolemia, unspecified: Secondary | ICD-10-CM | POA: Diagnosis not present

## 2019-08-04 DIAGNOSIS — I519 Heart disease, unspecified: Secondary | ICD-10-CM | POA: Diagnosis not present

## 2019-08-04 DIAGNOSIS — I10 Essential (primary) hypertension: Secondary | ICD-10-CM | POA: Diagnosis not present

## 2019-08-05 ENCOUNTER — Other Ambulatory Visit: Payer: Self-pay

## 2019-08-05 ENCOUNTER — Ambulatory Visit
Admission: RE | Admit: 2019-08-05 | Discharge: 2019-08-05 | Disposition: A | Discharge status: Home / Self Care | Payer: Medicare Other | Source: Ambulatory Visit | Attending: Family Medicine | Admitting: Family Medicine

## 2019-08-05 DIAGNOSIS — Z1231 Encounter for screening mammogram for malignant neoplasm of breast: Secondary | ICD-10-CM

## 2019-08-06 DIAGNOSIS — E663 Overweight: Secondary | ICD-10-CM | POA: Diagnosis not present

## 2019-08-06 DIAGNOSIS — I10 Essential (primary) hypertension: Secondary | ICD-10-CM | POA: Diagnosis not present

## 2019-08-06 DIAGNOSIS — I519 Heart disease, unspecified: Secondary | ICD-10-CM | POA: Diagnosis not present

## 2019-08-06 DIAGNOSIS — E119 Type 2 diabetes mellitus without complications: Secondary | ICD-10-CM | POA: Diagnosis not present

## 2019-08-14 DIAGNOSIS — E113293 Type 2 diabetes mellitus with mild nonproliferative diabetic retinopathy without macular edema, bilateral: Secondary | ICD-10-CM | POA: Diagnosis not present

## 2019-08-14 DIAGNOSIS — H5213 Myopia, bilateral: Secondary | ICD-10-CM | POA: Diagnosis not present

## 2019-08-14 DIAGNOSIS — H2513 Age-related nuclear cataract, bilateral: Secondary | ICD-10-CM | POA: Diagnosis not present

## 2019-08-14 DIAGNOSIS — Z0000 Encounter for general adult medical examination without abnormal findings: Secondary | ICD-10-CM | POA: Diagnosis not present

## 2019-09-09 DIAGNOSIS — I10 Essential (primary) hypertension: Secondary | ICD-10-CM | POA: Diagnosis not present

## 2019-09-09 DIAGNOSIS — E119 Type 2 diabetes mellitus without complications: Secondary | ICD-10-CM | POA: Diagnosis not present

## 2019-09-09 DIAGNOSIS — M170 Bilateral primary osteoarthritis of knee: Secondary | ICD-10-CM | POA: Diagnosis not present

## 2019-10-21 ENCOUNTER — Ambulatory Visit (INDEPENDENT_AMBULATORY_CARE_PROVIDER_SITE_OTHER): Payer: Medicare Other | Admitting: *Deleted

## 2019-10-21 DIAGNOSIS — I4821 Permanent atrial fibrillation: Secondary | ICD-10-CM | POA: Diagnosis not present

## 2019-10-21 LAB — CUP PACEART REMOTE DEVICE CHECK
Battery Remaining Longevity: 54 mo
Battery Remaining Percentage: 85 %
Brady Statistic RA Percent Paced: 0 %
Brady Statistic RV Percent Paced: 76 %
Date Time Interrogation Session: 20201229044100
Implantable Pulse Generator Implant Date: 20160513
Lead Channel Impedance Value: 324 Ohm
Lead Channel Impedance Value: 514 Ohm
Lead Channel Pacing Threshold Amplitude: 1.6 V
Lead Channel Pacing Threshold Pulse Width: 0.4 ms
Lead Channel Setting Pacing Amplitude: 2.1 V
Lead Channel Setting Pacing Pulse Width: 0.4 ms
Lead Channel Setting Sensing Sensitivity: 0.6 mV
Pulse Gen Serial Number: 732886

## 2019-11-04 DIAGNOSIS — Z20828 Contact with and (suspected) exposure to other viral communicable diseases: Secondary | ICD-10-CM | POA: Diagnosis not present

## 2019-11-05 ENCOUNTER — Other Ambulatory Visit: Payer: Self-pay | Admitting: Neurology

## 2019-11-05 DIAGNOSIS — I6381 Other cerebral infarction due to occlusion or stenosis of small artery: Secondary | ICD-10-CM

## 2019-11-06 DIAGNOSIS — M170 Bilateral primary osteoarthritis of knee: Secondary | ICD-10-CM | POA: Diagnosis not present

## 2019-11-13 DIAGNOSIS — M170 Bilateral primary osteoarthritis of knee: Secondary | ICD-10-CM | POA: Diagnosis not present

## 2019-11-19 DIAGNOSIS — M170 Bilateral primary osteoarthritis of knee: Secondary | ICD-10-CM | POA: Diagnosis not present

## 2019-11-19 DIAGNOSIS — R600 Localized edema: Secondary | ICD-10-CM | POA: Diagnosis not present

## 2019-11-19 DIAGNOSIS — E11319 Type 2 diabetes mellitus with unspecified diabetic retinopathy without macular edema: Secondary | ICD-10-CM | POA: Diagnosis not present

## 2019-12-11 DIAGNOSIS — E7800 Pure hypercholesterolemia, unspecified: Secondary | ICD-10-CM | POA: Diagnosis not present

## 2019-12-11 DIAGNOSIS — I519 Heart disease, unspecified: Secondary | ICD-10-CM | POA: Diagnosis not present

## 2019-12-11 DIAGNOSIS — E119 Type 2 diabetes mellitus without complications: Secondary | ICD-10-CM | POA: Diagnosis not present

## 2019-12-11 DIAGNOSIS — I10 Essential (primary) hypertension: Secondary | ICD-10-CM | POA: Diagnosis not present

## 2019-12-15 ENCOUNTER — Telehealth: Payer: Medicare Other | Admitting: Internal Medicine

## 2019-12-18 ENCOUNTER — Encounter: Payer: Self-pay | Admitting: Family Medicine

## 2019-12-18 ENCOUNTER — Ambulatory Visit: Payer: Medicare Other | Admitting: Family Medicine

## 2019-12-18 ENCOUNTER — Telehealth: Payer: Self-pay

## 2019-12-18 NOTE — Telephone Encounter (Signed)
Patient was a no call/no show for their appointment today.   

## 2019-12-23 DIAGNOSIS — E119 Type 2 diabetes mellitus without complications: Secondary | ICD-10-CM | POA: Diagnosis not present

## 2019-12-23 DIAGNOSIS — R635 Abnormal weight gain: Secondary | ICD-10-CM | POA: Diagnosis not present

## 2019-12-23 DIAGNOSIS — I10 Essential (primary) hypertension: Secondary | ICD-10-CM | POA: Diagnosis not present

## 2019-12-31 ENCOUNTER — Other Ambulatory Visit: Payer: Self-pay

## 2019-12-31 ENCOUNTER — Telehealth (INDEPENDENT_AMBULATORY_CARE_PROVIDER_SITE_OTHER): Payer: Medicare Other | Admitting: Internal Medicine

## 2019-12-31 ENCOUNTER — Telehealth: Payer: Self-pay | Admitting: *Deleted

## 2019-12-31 VITALS — BP 142/65 | HR 63 | Wt 243.0 lb

## 2019-12-31 DIAGNOSIS — Z950 Presence of cardiac pacemaker: Secondary | ICD-10-CM | POA: Diagnosis not present

## 2019-12-31 DIAGNOSIS — D6869 Other thrombophilia: Secondary | ICD-10-CM

## 2019-12-31 DIAGNOSIS — R001 Bradycardia, unspecified: Secondary | ICD-10-CM

## 2019-12-31 DIAGNOSIS — I10 Essential (primary) hypertension: Secondary | ICD-10-CM | POA: Diagnosis not present

## 2019-12-31 DIAGNOSIS — I441 Atrioventricular block, second degree: Secondary | ICD-10-CM

## 2019-12-31 DIAGNOSIS — I4821 Permanent atrial fibrillation: Secondary | ICD-10-CM

## 2019-12-31 DIAGNOSIS — Z7902 Long term (current) use of antithrombotics/antiplatelets: Secondary | ICD-10-CM

## 2019-12-31 DIAGNOSIS — I2510 Atherosclerotic heart disease of native coronary artery without angina pectoris: Secondary | ICD-10-CM | POA: Diagnosis not present

## 2019-12-31 DIAGNOSIS — Z6836 Body mass index (BMI) 36.0-36.9, adult: Secondary | ICD-10-CM | POA: Diagnosis not present

## 2019-12-31 MED ORDER — NITROGLYCERIN 0.4 MG SL SUBL: 0.4000 mg | SUBLINGUAL_TABLET | SUBLINGUAL | 1 refills | Status: AC | PRN

## 2019-12-31 NOTE — Progress Notes (Signed)
Electrophysiology TeleHealth Note  Due to national recommendations of social distancing due to [place] 19, an audio telehealth visit is felt to be most appropriate for this patient at this time.  Verbal consent was obtained by me for the telehealth visit today.  The patient does not have capability for a virtual visit.  A phone visit is therefore required today.   Date:  12/31/2019   ID:  Heather May, DOB [birth year], MRN [id number]  Location: patient's home  Provider location:  [place]  Evaluation Performed: Follow-up visit  PCP:  [name], MD   Electrophysiologist:  Dr Rayann Heman  Chief Complaint:  palpitations  History of Present Illness:    Heather May is a 74 y.o. female who presents via telehealth conferencing today.  Since last being seen in our clinic, the patient reports doing very well.  Today, she denies symptoms of palpitations, chest pain, shortness of breath,  lower extremity edema, dizziness, presyncope, or syncope.  She has gained more than 20 lbs since I last saw her. The patient is otherwise without complaint today.    Past Medical History:  Diagnosis Date  . Arthritis    joints  . Blood transfusion without reported diagnosis    "when oldest daughter was born"  . CAD (coronary artery disease) 08/15/13  . Diabetes mellitus without complication ([place])   . Diverticulosis   . Essential tremor   . GERD (gastroesophageal reflux disease)   . History of long-term use of multiple prescription drugs   . Hyperlipidemia    mixed  . Hypertension   . Long-term (current) use of anticoagulants   . PAD (peripheral artery disease) ([place])   . Percutaneous transluminal coronary angioplasty status   . Permanent atrial fibrillation (HCC)    chads2vasc score is at least 5  . Pulmonary embolism ([place])   . Pulmonary fibrosis ([place])   . Seizure ([place]) 2014   occured in setting of [place]  . Sick sinus syndrome ([place])   . Spinal stenosis   . Stroke ([place])   . Subarachnoid  hemorrhage ([place])   . Transient ischemic attack     Past Surgical History:  Procedure Laterality Date  . ABDOMINAL HYSTERECTOMY  2004  . BASAL CELL CARCINOMA EXCISION    . CARDIAC CATHETERIZATION  10/14   PCI   . carpel tunnel surgery Bilateral 2006/ 2010  . CESAREAN SECTION N/A 1978   x 1  . COLONOSCOPY    . HERNIA REPAIR  06/2010  . LEFT ATRIAL APPENDAGE OCCLUSION N/A 08/26/2015   Procedure: LEFT ATRIAL APPENDAGE OCCLUSION;  Surgeon: Thompson Grayer, MD;  Location: [facility];  Service: Cardiovascular;  Laterality: N/A;  . PACEMAKER INSERTION  03/05/15   [name] Accolade DR implanted in [place]  . POLYPECTOMY    . removal of big toe nail Bilateral   . TEE WITHOUT CARDIOVERSION N/A 08/16/2015   Procedure: TRANSESOPHAGEAL ECHOCARDIOGRAM (TEE);  Surgeon: Pixie Casino, MD;  Location: [facility] ENDOSCOPY;  Service: Cardiovascular;  Laterality: N/A;  . TEE WITHOUT CARDIOVERSION N/A 10/12/2015   Procedure: TRANSESOPHAGEAL ECHOCARDIOGRAM (TEE);  Surgeon: Pixie Casino, MD;  Location: [facility] ENDOSCOPY;  Service: Cardiovascular;  Laterality: N/A;  . titanium pins inserted metatarsol bone  02/2012   2 pins placed     Current Outpatient Medications  Medication Sig Dispense Refill  . benazepril (LOTENSIN) 20 MG tablet     . clopidogrel (PLAVIX) 75 MG tablet TAKE 1 TABLET(75 MG) BY MOUTH DAILY 30  tablet 11  . diltiazem (CARDIZEM) 30 MG tablet TAKE 1 TABLET(30 MG) BY MOUTH DAILY AS NEEDED (Patient not taking: Reported on 05/12/2019) 30 tablet 8  . hydrochlorothiazide (HYDRODIURIL) 25 MG tablet     . LEVEMIR FLEXTOUCH 100 UNIT/ML Pen     . lovastatin (MEVACOR) 40 MG tablet Take 40 mg by mouth at bedtime.    . Multiple Vitamin (MULTIVITAMIN) tablet Take 1 tablet by mouth daily.    . ondansetron (ZOFRAN) 4 MG tablet Take 4 mg by mouth every 8 (eight) hours as needed for nausea or vomiting.    . pioglitazone (ACTOS) 30 MG tablet Take 30 mg by mouth daily.    . Probiotic Product  (ALIGN PO) Take by mouth 2 (two) times a day.     No current facility-administered medications for this visit.    Allergies:   Hydrocodone and Sulfa antibiotics   Social History:  The patient  reports that she quit smoking about 10 years ago. She has never used smokeless tobacco. She reports that she does not drink alcohol or use drugs.   Family History:  The patient's family history includes Atrial fibrillation in her brother and sister; Diabetes Mellitus II in her brother; Epilepsy in her mother; Heart attack in her father; Pulmonary embolism in her son; Stroke in her father and mother.   ROS:  Please see the history of present illness.   All other systems are personally reviewed and negative.    Exam:    Vital Signs:  BP (!) 142/65   Pulse 63   Wt 243 lb (110.2 kg)   BMI 36.95 kg/m   Well sounding, alert and conversant   Labs/Other Tests and Data Reviewed:    Recent Labs: No results found for requested labs within last 8760 hours.   Wt Readings from Last 3 Encounters:  12/31/19 243 lb (110.2 kg)  05/23/19 218 lb (98.9 kg)  05/12/19 215 lb (97.5 kg)     Last device remote is reviewed from [place] [facility] which reveals normal device function, persistent afib   ASSESSMENT & PLAN:    1.  Symptomatic bradycardia/ second degree AV block Remotes are up to date Normal pacemaker function No changes  2. Permanent afib S/p Watchman Continue full dose ASA  3. CAD No ischemic symptoms Refill sl NTG  4. HTN Stable No change required today  5. Morbid obesity Body mass index is 36.95 kg/m. Lifestyle modification is encouraged  Follow-up: EP NP in a year   Patient Risk:  after full review of this patients clinical status, I feel that they are at moderate risk at this time.  Today, I have spent 15 minutes with the patient with telehealth technology discussing arrhythmia management .    Army Fossa, MD  12/31/2019 4:33 PM     [address] [place] [phone] (office) [phone] (fax)

## 2019-12-31 NOTE — Telephone Encounter (Signed)
-----   Message from Thompson Grayer, MD sent at 12/31/2019  4:31 PM EST ----- Please refill sl NTG She uses [facility] on [address]

## 2020-01-07 ENCOUNTER — Ambulatory Visit (INDEPENDENT_AMBULATORY_CARE_PROVIDER_SITE_OTHER): Payer: Medicare Other | Admitting: Cardiology

## 2020-01-07 ENCOUNTER — Other Ambulatory Visit: Payer: Self-pay

## 2020-01-07 ENCOUNTER — Encounter: Payer: Self-pay | Admitting: Cardiology

## 2020-01-07 VITALS — BP 104/70 | HR 67 | Ht 68.0 in | Wt 239.0 lb

## 2020-01-07 DIAGNOSIS — I2510 Atherosclerotic heart disease of native coronary artery without angina pectoris: Secondary | ICD-10-CM | POA: Diagnosis not present

## 2020-01-07 DIAGNOSIS — I6381 Other cerebral infarction due to occlusion or stenosis of small artery: Secondary | ICD-10-CM | POA: Diagnosis not present

## 2020-01-07 DIAGNOSIS — Z950 Presence of cardiac pacemaker: Secondary | ICD-10-CM | POA: Diagnosis not present

## 2020-01-07 DIAGNOSIS — I4821 Permanent atrial fibrillation: Secondary | ICD-10-CM

## 2020-01-07 DIAGNOSIS — R001 Bradycardia, unspecified: Secondary | ICD-10-CM | POA: Diagnosis not present

## 2020-01-07 NOTE — Progress Notes (Signed)
Cardiology Office Note:    Date:  01/07/2020   ID:  Heather May, DOB 11/12/1945, MRN [id number]  PCP:  [name], MD  Cardiologist:  Candee Furbish, MD  Electrophysiologist:  None   Referring MD: [name], MD     History of Present Illness:    Heather May is a 74 y.o. female here for the follow-up of atrial fibrillation.  Last seen by Dr. Rayann Heman on 12/31/2019 via telemedicine visit.  She was doing well.    She continues to take Plavix monotherapy given her previous stent placements.  We decided upon Plavix monotherapy instead of aspirin monotherapy with her watchman device.  She does have some easy bruising.  I did explain that she does not need to be on anticoagulation with Coumadin Xarelto or Eliquis.  She denies any anginal type symptoms.   No fevers chills nausea vomiting syncope bleeding.    Past Medical History:  Diagnosis Date  . Arthritis    joints  . Blood transfusion without reported diagnosis    "when oldest daughter was born"  . CAD (coronary artery disease) 08/15/13  . Diabetes mellitus without complication ([place])   . Diverticulosis   . Essential tremor   . GERD (gastroesophageal reflux disease)   . History of long-term use of multiple prescription drugs   . Hyperlipidemia    mixed  . Hypertension   . Long-term (current) use of anticoagulants   . PAD (peripheral artery disease) ([place])   . Percutaneous transluminal coronary angioplasty status   . Permanent atrial fibrillation (HCC)    chads2vasc score is at least 5  . Pulmonary embolism ([place])   . Pulmonary fibrosis ([place])   . Seizure ([place]) 2014   occured in setting of [place]  . Sick sinus syndrome ([place])   . Spinal stenosis   . Stroke ([place])   . Subarachnoid hemorrhage ([place])   . Transient ischemic attack     Past Surgical History:  Procedure Laterality Date  . ABDOMINAL HYSTERECTOMY  2004  . BASAL CELL CARCINOMA EXCISION    . CARDIAC CATHETERIZATION  10/14   PCI   . carpel tunnel surgery  Bilateral 2006/ 2010  . CESAREAN SECTION N/A 1978   x 1  . COLONOSCOPY    . HERNIA REPAIR  06/2010  . LEFT ATRIAL APPENDAGE OCCLUSION N/A 08/26/2015   Procedure: LEFT ATRIAL APPENDAGE OCCLUSION;  Surgeon: Thompson Grayer, MD;  Location: [facility];  Service: Cardiovascular;  Laterality: N/A;  . PACEMAKER INSERTION  03/05/15   [name] Accolade DR implanted in [place]  . POLYPECTOMY    . removal of big toe nail Bilateral   . TEE WITHOUT CARDIOVERSION N/A 08/16/2015   Procedure: TRANSESOPHAGEAL ECHOCARDIOGRAM (TEE);  Surgeon: Pixie Casino, MD;  Location: [facility] ENDOSCOPY;  Service: Cardiovascular;  Laterality: N/A;  . TEE WITHOUT CARDIOVERSION N/A 10/12/2015   Procedure: TRANSESOPHAGEAL ECHOCARDIOGRAM (TEE);  Surgeon: Pixie Casino, MD;  Location: [facility] ENDOSCOPY;  Service: Cardiovascular;  Laterality: N/A;  . titanium pins inserted metatarsol bone  02/2012   2 pins placed     Current Medications: Current Meds  Medication Sig  . benazepril (LOTENSIN) 20 MG tablet Take 20 mg by mouth daily.  . clopidogrel (PLAVIX) 75 MG tablet TAKE 1 TABLET(75 MG) BY MOUTH DAILY  . diltiazem (CARDIZEM) 30 MG tablet TAKE 1 TABLET(30 MG) BY MOUTH DAILY AS NEEDED  . hydrochlorothiazide (HYDRODIURIL) 25 MG tablet Take 25 mg by mouth daily.   [name] LEVEMIR  FLEXTOUCH 100 UNIT/ML Pen   . lovastatin (MEVACOR) 40 MG tablet Take 40 mg by mouth at bedtime.  . Multiple Vitamin (MULTIVITAMIN) tablet Take 1 tablet by mouth daily.  . nitroGLYCERIN (NITROSTAT) 0.4 MG SL tablet Place 1 tablet (0.4 mg total) under the tongue every 5 (five) minutes as needed for chest pain.  [name] ondansetron (ZOFRAN) 4 MG tablet Take 4 mg by mouth every 8 (eight) hours as needed for nausea or vomiting.  . pioglitazone (ACTOS) 30 MG tablet Take 30 mg by mouth daily.  . Probiotic Product (ALIGN PO) Take by mouth 2 (two) times a day.  . TRULICITY A999333 0000000 SOPN SMARTSIG:0.5 Milliliter(s) SUB-Q Once a Week     Allergies:    Hydrocodone and Sulfa antibiotics   Social History   Socioeconomic History  . Marital status: Significant Other    Spouse name: Not on file  . Number of children: 5  . Years of education: 61  . Highest education level: Not on file  Occupational History  . Occupation: Retired  Tobacco Use  . Smoking status: Former Smoker    Quit date: 07/06/2009    Years since quitting: 10.5  . Smokeless tobacco: Never Used  . Tobacco comment: didnt smoke much  Substance and Sexual Activity  . Alcohol use: No    Alcohol/week: 0.0 standard drinks    Comment: very infrequently  . Drug use: No  . Sexual activity: Not on file  Other Topics Concern  . Not on file  Social History Narrative   Pt recently moved to [facility] [place].    Previously worked as a Teacher, music.   Caffeine use: only when eating out tea/soda   Right-handed   Lives at home with partner/roommate   Social Determinants of Health   Financial Resource Strain:   . Difficulty of Paying Living Expenses:   Food Insecurity:   . Worried About Charity fundraiser in the Last Year:   . Arboriculturist in the Last Year:   Transportation Needs:   . Film/video editor (Medical):   [name] Lack of Transportation (Non-Medical):   Physical Activity:   . Days of Exercise per Week:   . Minutes of Exercise per Session:   Stress:   . Feeling of Stress :   Social Connections:   . Frequency of Communication with Friends and Family:   . Frequency of Social Gatherings with Friends and Family:   . Attends Religious Services:   . Active Member of Clubs or Organizations:   . Attends Archivist Meetings:   [name] Marital Status:      Family History: The patient's family history includes Atrial fibrillation in her brother and sister; Diabetes Mellitus II in her brother; Epilepsy in her mother; Heart attack in her father; Pulmonary embolism in her son; Stroke in her father and mother. There is no history of Colon cancer,  Esophageal cancer, Stomach cancer, Rectal cancer, or Colon polyps.  ROS:   Please see the history of present illness.    She does have some easy bruising.  No fevers chills nausea vomiting syncope bleeding all other systems reviewed and are negative.  EKGs/Labs/Other Studies Reviewed:    The following studies were reviewed today: Prior echocardiogram, TEE reviewed, Dr. Jackalyn Lombard notes reviewed  EKG:  EKG is  ordered today.  The ekg ordered today demonstrates atrial fibrillation heart rate 67 with T wave inversion in the inferior leads, ventricular paced beats occasionally.  Recent  Labs: No results found for requested labs within last 8760 hours.  Recent Lipid Panel No results found for: CHOL, TRIG, HDL, CHOLHDL, VLDL, LDLCALC, LDLDIRECT  Physical Exam:    VS:  BP 104/70   Pulse 67   Ht 5\' 8"  (1.727 m)   Wt 239 lb (108.4 kg)   SpO2 98%   BMI 36.34 kg/m     Wt Readings from Last 3 Encounters:  01/07/20 239 lb (108.4 kg)  12/31/19 243 lb (110.2 kg)  05/23/19 218 lb (98.9 kg)     GEN: Utilizes rolling walker for assistance well nourished, well developed in no acute distress HEENT: Normal NECK: No JVD; No carotid bruits LYMPHATICS: No lymphadenopathy CARDIAC: Irreg irreg 1/6 SM, no rubs, gallops RESPIRATORY:  Clear to auscultation without rales, wheezing or rhonchi  ABDOMEN: Soft, non-tender, non-distended MUSCULOSKELETAL:  No edema; No deformity  SKIN: Warm and dry NEUROLOGIC:  Alert and oriented x 3 PSYCHIATRIC:  Normal affect   ASSESSMENT:    1. Permanent atrial fibrillation ([place])   2. Morbid obesity ([place])   3. Coronary artery disease involving native coronary artery of native heart without angina pectoris   4. Symptomatic bradycardia   5. Pacemaker    PLAN:    In order of problems listed above:  Permanent atrial fibrillation -Dr. Rayann Heman.  Appreciate.  Watchman device November 2016.  On Plavix monotherapy instead of aspirin given her prior stents placed in  [place].  Once again, her sister died from atrial fibrillation she stated.  Symptomatic bradycardia -Normal pacemaker function.  No changes made.  CAD -No ischemic symptoms.  Nitroglycerin has been refilled.  Doing well. -Last PCI in 2014.  Had 4 stents placed in total she states.  Moved from [place].  Essential hypertension -Stable.  Well-controlled.  Moderate MR. Mild AR. -We will go ahead and recheck echocardiogram since it has been a few years.  No significant change in symptoms.  Morbid obesity -BMI greater than 35 with 2 or more comorbidities.  Continue to encourage weight loss.   Medication Adjustments/Labs and Tests Ordered: Current medicines are reviewed at length with the patient today.  Concerns regarding medicines are outlined above.  Orders Placed This Encounter  Procedures  . EKG 12-Lead  . ECHOCARDIOGRAM COMPLETE   No orders of the defined types were placed in this encounter.   Patient Instructions  Medication Instructions:  Your physician recommends that you continue on your current medications as directed. Please refer to the Current Medication list given to you today.  *If you need a refill on your cardiac medications before your next appointment, please call your pharmacy*   Testing/Procedures: Your physician has requested that you have an echocardiogram. Echocardiography is a painless test that uses sound waves to create images of your heart. It provides your doctor with information about the size and shape of your heart and how well your heart's chambers and valves are working. This procedure takes approximately one hour. There are no restrictions for this procedure.   Follow-Up: At [facility], you and your health needs are our priority.  As part of our continuing mission to provide you with exceptional heart care, we have created designated Provider Care Teams.  These Care Teams include your primary Cardiologist (physician) and Advanced Practice  Providers (APPs -  Physician Assistants and Nurse Practitioners) who all work together to provide you with the care you need, when you need it.   Your next appointment:   1 year(s)  The format for your  next appointment:   In Person  Provider:   Candee Furbish, MD      Signed, Candee Furbish, MD  01/07/2020 11:44 AM    [place]

## 2020-01-07 NOTE — Patient Instructions (Signed)
Medication Instructions:  Your physician recommends that you continue on your current medications as directed. Please refer to the Current Medication list given to you today.  *If you need a refill on your cardiac medications before your next appointment, please call your pharmacy*   Testing/Procedures: Your physician has requested that you have an echocardiogram. Echocardiography is a painless test that uses sound waves to create images of your heart. It provides your doctor with information about the size and shape of your heart and how well your heart's chambers and valves are working. This procedure takes approximately one hour. There are no restrictions for this procedure.   Follow-Up: At [facility], you and your health needs are our priority.  As part of our continuing mission to provide you with exceptional heart care, we have created designated Provider Care Teams.  These Care Teams include your primary Cardiologist (physician) and Advanced Practice Providers (APPs -  Physician Assistants and Nurse Practitioners) who all work together to provide you with the care you need, when you need it.   Your next appointment:   1 year(s)  The format for your next appointment:   In Person  Provider:   Candee Furbish, MD

## 2020-01-20 ENCOUNTER — Ambulatory Visit (INDEPENDENT_AMBULATORY_CARE_PROVIDER_SITE_OTHER): Payer: Medicare Other | Admitting: *Deleted

## 2020-01-20 DIAGNOSIS — R001 Bradycardia, unspecified: Secondary | ICD-10-CM | POA: Diagnosis not present

## 2020-01-20 LAB — CUP PACEART REMOTE DEVICE CHECK
Battery Remaining Longevity: 48 mo
Battery Remaining Percentage: 80 %
Brady Statistic RA Percent Paced: 0 %
Brady Statistic RV Percent Paced: 76 %
Date Time Interrogation Session: 20210330044100
Implantable Pulse Generator Implant Date: 20160513
Lead Channel Impedance Value: 333 Ohm
Lead Channel Impedance Value: 536 Ohm
Lead Channel Pacing Threshold Amplitude: 1.4 V
Lead Channel Pacing Threshold Pulse Width: 0.4 ms
Lead Channel Setting Pacing Amplitude: 1.9 V
Lead Channel Setting Pacing Pulse Width: 0.4 ms
Lead Channel Setting Sensing Sensitivity: 0.6 mV
Pulse Gen Serial Number: 732886

## 2020-01-20 NOTE — Progress Notes (Signed)
PPM Remote  

## 2020-01-26 ENCOUNTER — Other Ambulatory Visit: Payer: Self-pay

## 2020-01-26 ENCOUNTER — Ambulatory Visit (HOSPITAL_COMMUNITY): Payer: Medicare Other | Attending: Cardiovascular Disease

## 2020-01-26 DIAGNOSIS — I4821 Permanent atrial fibrillation: Secondary | ICD-10-CM | POA: Diagnosis not present

## 2020-01-28 ENCOUNTER — Telehealth: Payer: Self-pay

## 2020-01-28 DIAGNOSIS — I340 Nonrheumatic mitral (valve) insufficiency: Secondary | ICD-10-CM

## 2020-01-28 DIAGNOSIS — I77810 Thoracic aortic ectasia: Secondary | ICD-10-CM

## 2020-01-28 NOTE — Telephone Encounter (Signed)
Reviewed results with patient who verbalized understanding.   Repeat echo ordered to be scheduled in 1 year. The patient agrees with treatment plan.

## 2020-01-28 NOTE — Telephone Encounter (Signed)
-----   Message from Jerline Pain, MD sent at 01/27/2020  8:24 PM EDT ----- Normal pump function Mild mitral regurgitation Mild aortic regurgitatation Dilated aorta 42 mm Repeat ECHO in one year to monitor aorta Candee Furbish, MD

## 2020-01-29 DIAGNOSIS — E119 Type 2 diabetes mellitus without complications: Secondary | ICD-10-CM | POA: Diagnosis not present

## 2020-01-29 DIAGNOSIS — I519 Heart disease, unspecified: Secondary | ICD-10-CM | POA: Diagnosis not present

## 2020-01-29 DIAGNOSIS — I10 Essential (primary) hypertension: Secondary | ICD-10-CM | POA: Diagnosis not present

## 2020-01-29 DIAGNOSIS — E7800 Pure hypercholesterolemia, unspecified: Secondary | ICD-10-CM | POA: Diagnosis not present

## 2020-02-05 ENCOUNTER — Other Ambulatory Visit: Payer: Self-pay

## 2020-02-05 ENCOUNTER — Ambulatory Visit (INDEPENDENT_AMBULATORY_CARE_PROVIDER_SITE_OTHER): Payer: Medicare Other | Admitting: Family Medicine

## 2020-02-05 ENCOUNTER — Encounter: Payer: Self-pay | Admitting: Family Medicine

## 2020-02-05 VITALS — BP 120/64 | HR 64 | Temp 97.2°F | Ht 66.0 in | Wt 241.4 lb

## 2020-02-05 DIAGNOSIS — G4731 Primary central sleep apnea: Secondary | ICD-10-CM

## 2020-02-05 DIAGNOSIS — E119 Type 2 diabetes mellitus without complications: Secondary | ICD-10-CM | POA: Diagnosis not present

## 2020-02-05 DIAGNOSIS — Z87898 Personal history of other specified conditions: Secondary | ICD-10-CM | POA: Diagnosis not present

## 2020-02-05 DIAGNOSIS — Z8679 Personal history of other diseases of the circulatory system: Secondary | ICD-10-CM

## 2020-02-05 DIAGNOSIS — I4819 Other persistent atrial fibrillation: Secondary | ICD-10-CM | POA: Diagnosis not present

## 2020-02-05 NOTE — Progress Notes (Signed)
PATIENT: Heather May DOB: 06/03/1946  REASON FOR VISIT: follow up HISTORY FROM: patient  Chief Complaint  Patient presents with  . Sleep apnea    rm 7, one year <no problems with BiPAP; occas feel like I am smothering"     HISTORY OF PRESENT ILLNESS: Today 02/05/20 Heather May is a 74 y.o. 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 She reports one episode where she felt as if she were smothering but this has not occurred recently. She is doing well with her machine and has all needed supplies. She does admit to not using ASV 1 night every week as she helps to care for her grandchildren at her daughter's home. She does note benefit of using ASV therapy.  Compliance report dated 11/06/2019 through 02/03/2020 reveals that she used ASV 80 of the past 90 days for compliance of 89%. She used ASV therapy greater than 4 hours 73 of the past 90 days for compliance of 81%. Average usage was 6 hours and 26 minutes. Residual AHI was 1.6 with minimum EPAP of 5 cm of water, minimum pressure support of 3 cm of water and maximum pressure support of 15 cm of water. There was no significant leak noted.  She discontinued levetiracetam last year. No seizure activity. She continues Plavix 75mg  daily in setting of previous CVA, CAD s/p cardiac stents and atril fib. She is followed closely by PCP for stroke prevention and management of co morbidities. She is also followed regularly by cardiology. She requested to stop Plavix at her last follow-up but was instructed to continue due to cardiac stents. She is exercising regularly. She participates with [employer] at her local [employer] 3 days a week. She stays well-hydrated. She does note chronic pain but feels that she is well managed. She uses her Rollator for long distance walking.    HISTORY: (copied from my note on 12/20/2018)  Heather May is a 74 y.o. 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  Compliance report  dated 11/16/2018 through 12/15/2018 reveals that she is using BiPAP 29 out of 30 days for compliance 97%.  She is using her BiPAP for greater than 4 hours 23 out of 30 days for compliance of 77%.  Average usage is 5 hours and 32 minutes.  AHI was 4.7.  There is no significant leak.  She reports that she has done well since mask refitting.  She does note benefit from using BiPAP as she is less sleepy during the day.  She admits that she often goes to bed late and does not use the machine for greater than 4 hours.  She has been seen in the past for [facility] in 2014, seizure post SAH and lucunar infarct in 2019.  It was noted that Keppra should be discontinued however she has continued to take this medication.  She has not had any seizure activity and only one with SAH.[name] She was also diagnosed with Parkinson's around 2015 but was later told that she had an essential tremor.  She reports that she requested a second opinion after participating in a Parkinson's support group.  She did not feel that she had any symptoms related to Parkinson's with the exception of a head tremor.  Head tremor is worse when she is nervous or tired.  She has not noticed any changes with her gait or ability to move.  No concerns of memory loss this time.  REVIEW OF SYSTEMS: Out of a complete 14 system review of symptoms, the patient complains only of the following symptoms, chronic pain, fatigue and all other reviewed systems are negative.  ESS:8  ALLERGIES: Allergies  Allergen Reactions  . Hydrocodone Nausea And Vomiting  . Sulfa Antibiotics     RASH    HOME MEDICATIONS: Outpatient Medications Prior to Visit  Medication Sig Dispense Refill  . benazepril (LOTENSIN) 20 MG tablet Take 20 mg by mouth daily.    . clopidogrel (PLAVIX) 75 MG tablet TAKE 1 TABLET(75 MG) BY MOUTH DAILY 30 tablet 11  . diltiazem (CARDIZEM) 30 MG tablet TAKE 1 TABLET(30 MG) BY MOUTH DAILY AS NEEDED 30 tablet 8  . hydrochlorothiazide (HYDRODIURIL) 25  MG tablet Take 25 mg by mouth daily.     [name] LEVEMIR FLEXTOUCH 100 UNIT/ML Pen     . lovastatin (MEVACOR) 40 MG tablet Take 40 mg by mouth at bedtime.    . Multiple Vitamin (MULTIVITAMIN) tablet Take 1 tablet by mouth daily.    . nitroGLYCERIN (NITROSTAT) 0.4 MG SL tablet Place 1 tablet (0.4 mg total) under the tongue every 5 (five) minutes as needed for chest pain. 25 tablet 1  . ondansetron (ZOFRAN) 4 MG tablet Take 4 mg by mouth every 8 (eight) hours as needed for nausea or vomiting.    . pioglitazone (ACTOS) 30 MG tablet Take 30 mg by mouth daily.    . Probiotic Product (ALIGN PO) Take by mouth 2 (two) times a day.    . TRULICITY [id number] 0000000 SOPN SMARTSIG:0.5 Milliliter(s) SUB-Q Once a Week     No facility-administered medications prior to visit.    PAST MEDICAL HISTORY: Past Medical History:  Diagnosis Date  . Arthritis    joints  . Blood transfusion without reported diagnosis    "when oldest daughter was born"  . CAD (coronary artery disease) 08/15/13  . Diabetes mellitus without complication ([place])   . Diverticulosis   . Essential tremor   . GERD (gastroesophageal reflux disease)   . History of long-term use of multiple prescription drugs   . Hyperlipidemia    mixed  . Hypertension   . Long-term (current) use of anticoagulants   . PAD (peripheral artery disease) ([place])   . Percutaneous transluminal coronary angioplasty status   . Permanent atrial fibrillation (HCC)    chads2vasc score is at least 5  . Pulmonary embolism ([place])   . Pulmonary fibrosis ([place])   . Seizure ([place]) 2014   occured in setting of [place]  . Sick sinus syndrome ([place])   . Spinal stenosis   . Stroke ([place])   . Subarachnoid hemorrhage ([place])   . Transient ischemic attack     PAST SURGICAL HISTORY: Past Surgical History:  Procedure Laterality Date  . ABDOMINAL HYSTERECTOMY  2004  . BASAL CELL CARCINOMA EXCISION    . CARDIAC CATHETERIZATION  10/14   PCI   . carpel tunnel surgery Bilateral 2006/ 2010    . CESAREAN SECTION N/A 1978   x 1  . COLONOSCOPY    . HERNIA REPAIR  06/2010  . LEFT ATRIAL APPENDAGE OCCLUSION N/A 08/26/2015   Procedure: LEFT ATRIAL APPENDAGE OCCLUSION;  Surgeon: Thompson Grayer, MD;  Location: [facility];  Service: Cardiovascular;  Laterality: N/A;  . PACEMAKER INSERTION  03/05/15   [name] Accolade DR implanted in [place]  . POLYPECTOMY    . removal of big toe nail Bilateral   . TEE WITHOUT CARDIOVERSION N/A 08/16/2015  Procedure: TRANSESOPHAGEAL ECHOCARDIOGRAM (TEE);  Surgeon: Pixie Casino, MD;  Location: [facility] ENDOSCOPY;  Service: Cardiovascular;  Laterality: N/A;  . TEE WITHOUT CARDIOVERSION N/A 10/12/2015   Procedure: TRANSESOPHAGEAL ECHOCARDIOGRAM (TEE);  Surgeon: Pixie Casino, MD;  Location: [facility] END[facility]COPY;  Service: Cardiovascular;  Laterality: N/A;  . titanium pins inserted metatarsol bone  02/2012   2 pins placed     FAMILY HISTORY: Family History  Problem Relation Age of Onset  . Stroke Mother   . Epilepsy Mother   . Stroke Father   . Heart attack Father   . Atrial fibrillation Sister   . Atrial fibrillation Brother   . Diabetes Mellitus II Brother   . Pulmonary embolism Son   . Colon cancer Neg Hx   . Esophageal cancer Neg Hx   . Stomach cancer Neg Hx   . Rectal cancer Neg Hx   . Colon polyps Neg Hx     SOCIAL HISTORY: Social History   Socioeconomic History  . Marital status: Significant Other    Spouse name: Not on file  . Number of children: 5  . Years of education: 33  . Highest education level: Not on file  Occupational History  . Occupation: Retired  Tobacco Use  . Smoking status: Former Smoker    Quit date: 07/06/2009    Years since quitting: 10.5  . Smokeless tobacco: Never Used  . Tobacco comment: didnt smoke much  Substance and Sexual Activity  . Alcohol use: No    Alcohol/week: 0.0 standard drinks    Comment: very infrequently  . Drug use: No  . Sexual activity: Not on file  Other Topics  Concern  . Not on file  Social History Narrative   Pt recently moved to [facility] [place].    Previously worked as a Teacher, music.   Caffeine use: only when eating out tea/soda   Right-handed   Lives at home with partner/roommate   Social Determinants of Health   Financial Resource Strain:   . Difficulty of Paying Living Expenses:   Food Insecurity:   . Worried About Charity fundraiser in the Last Year:   . Arboriculturist in the Last Year:   Transportation Needs:   . Film/video editor (Medical):   [name] Lack of Transportation (Non-Medical):   Physical Activity:   . Days of Exercise per Week:   . Minutes of Exercise per Session:   Stress:   . Feeling of Stress :   Social Connections:   . Frequency of Communication with Friends and Family:   . Frequency of Social Gatherings with Friends and Family:   . Attends Religious Services:   . Active Member of Clubs or Organizations:   . Attends Archivist Meetings:   [name] Marital Status:   Intimate Partner Violence:   . Fear of Current or Ex-Partner:   . Emotionally Abused:   [name] Physically Abused:   . Sexually Abused:       PHYSICAL EXAM  Vitals:   02/05/20 1450  BP: 120/64  Pulse: 64  Temp: (!) 97.2 F (36.2 C)  Weight: 241 lb 6.4 oz (109.5 kg)  Height: 5\' 6"  (1.676 m)   Body mass index is 38.96 kg/m.  Generalized: Well developed, in no acute distress  Cardiology: normal rate and rhythm, no murmur noted Respiratory: clear to auscultation bilaterally  Neurological examination  Mentation: Alert oriented to time, place, history taking. Follows all commands speech and language fluent Cranial  nerve II-XII: Pupils were equal round reactive to light. Extraocular movements were full, visual field were full on confrontational test. Facial sensation and strength were normal. Uvula tongue midline. Head turning and shoulder shrug  were normal and symmetric. Motor: The motor testing reveals 5 over 5  strength of bilateral upper extremities. 4+ of 5 of the left lower extremity, 4/5 of right lower extremity. Good symmetric motor tone is noted throughout. Head tremor noted, worse when talking in exam room. Mild bradykinesia noted with finger and toe taps. No cogwheel rigidity.  Sensory: Sensory testing is intact to soft touch on all 4 extremities. No evidence of extinction is noted.  Coordination: Cerebellar testing reveals good finger-nose-finger and heel-to-shin bilaterally.  Gait and station: Uses both arms to push to a standing position. Gait is short but stable with Rolator. Tandem gait not assessed today.  Reflexes: Deep tendon reflexes are symmetric and normal bilaterally.   DIAGNOSTIC DATA (LABS, IMAGING, TESTING) - I reviewed patient records, labs, notes, testing and imaging myself where available.  No flowsheet data found.   Lab Results  Component Value Date   WBC 6.3 10/13/2015   HGB 13.0 10/13/2015   HCT 39.6 10/13/2015   MCV 93.8 10/13/2015   PLT 211 10/13/2015      Component Value Date/Time   NA 136 10/13/2015 1053   K 4.2 10/13/2015 1053   CL 100 10/13/2015 1053   CO2 25 10/13/2015 1053   GLUCOSE 303 (H) 10/13/2015 1053   BUN 15 10/13/2015 1053   CREATININE 0.74 10/13/2015 1053   CALCIUM 9.5 10/13/2015 1053   PROT 7.2 06/14/2016 1145   GFRNONAA >60 08/27/2015 0347   GFRAA >60 08/27/2015 0347   No results found for: CHOL, HDL, LDLCALC, LDLDIRECT, TRIG, CHOLHDL No results found for: HGBA1C Lab Results  Component Value Date   VITAMINB12 648 06/14/2016   Lab Results  Component Value Date   TSH 1.470 06/14/2016       ASSESSMENT AND PLAN 74 y.o. year old female  has a past medical history of Arthritis, Blood transfusion without reported diagnosis, CAD (coronary artery disease) (08/15/13), Diabetes mellitus without complication ([place]), Diverticulosis, Essential tremor, GERD (gastroesophageal reflux disease), History of long-term use of multiple prescription  drugs, Hyperlipidemia, Hypertension, Long-term (current) use of anticoagulants, PAD (peripheral artery disease) ([place]), Percutaneous transluminal coronary angioplasty status, Permanent atrial fibrillation ([place]), Pulmonary embolism ([place]), Pulmonary fibrosis ([place]), Seizure ([place]) (2014), Sick sinus syndrome ([place]), Spinal stenosis, Stroke ([place]), Subarachnoid hemorrhage ([place]), and Transient ischemic attack. here with     ICD-10-CM   1. Complex sleep apnea syndrome  G47.31   2. History of seizure  Z87.898   3. History of subarachnoid hemorrhage  Z86.79   4. Persistent atrial fibrillation (HCC)  I48.19   5. Diabetes mellitus without complication (HCC)  [id number]     Cherrell is doing fairly well today. She continues ASV therapy without any significant difficulty. We have discussed insurance requirements of being 70% compliant as well as recommendation for nightly use of ASV with a minimum of 4-hour use each night. 90-day compliance report reveals acceptable compliance. She will continue close follow-up with primary care and cardiology. We will continue Plavix daily as prescribed. Stroke precautions reviewed. She will continue healthy lifestyle habits with well-balanced diet and regular exercise. She will follow-up with [place] in 1 year, sooner if needed. She verbalizes understanding and agreement with this plan.   No orders of the defined types were placed in this encounter.    No orders  of the defined types were placed in this encounter.     I spent 25 minutes with the patient. 50% of this time was spent counseling and educating patient on plan of care and medications.    Debbora Presto, FNP-C 02/05/2020, 3:09 PM [facility] [address], [place], [place] [address] [phone]

## 2020-02-05 NOTE — Patient Instructions (Addendum)
Please continue using your ASV therapy regularly. While your insurance requires that you use BiPAP at least 4 hours each night on 70% of the nights, I recommend, that you not skip any nights and use it throughout the night if you can. Getting used to ASV and staying with the treatment long term does take time and patience and discipline. Untreated obstructive sleep apnea when it is moderate to severe can have an adverse impact on cardiovascular health and raise her risk for heart disease, arrhythmias, hypertension, congestive heart failure, stroke and diabetes. Untreated obstructive sleep apnea causes sleep disruption, nonrestorative sleep, and sleep deprivation. This can have an impact on your day to day functioning and cause daytime sleepiness and impairment of cognitive function, memory loss, mood disturbance, and problems focussing. Using ASV regularly can improve these symptoms.  Continue Plavix daily. Continue close follow up with PCP for management of co morbidities and stroke prevention. Well balanced, heart healthy diet and regular exercise advised. Adequate hydration advised.   Follow up with [place] in 1 year, sooner if needed    Stroke Prevention Some medical conditions and lifestyle choices can lead to a higher risk for a stroke. You can help to prevent a stroke by making nutrition, lifestyle, and other changes. What nutrition changes can be made?   Eat healthy foods. ? Choose foods that are high in fiber. These include:  Fresh fruits.  Fresh vegetables.  Whole grains. ? Eat at least 5 or more servings of fruits and vegetables each day. Try to fill half of your plate at each meal with fruits and vegetables. ? Choose lean protein foods. These include:  Lowfat (lean) cuts of meat.  Chicken without skin.  Fish.  Tofu.  Beans.  Nuts. ? Eat low-fat dairy products. ? Avoid foods that:  Are high in salt (sodium).  Have saturated fat.  Have trans fat.  Have  cholesterol.  Are processed.  Are premade.  Follow eating guidelines as told by your doctor. These may include: ? Reducing how many calories you eat and drink each day. ? Limiting how much salt you eat or drink each day to 1,500 milligrams (mg). ? Using only healthy fats for cooking. These include:  Olive oil.  Canola oil.  Sunflower oil. ? Counting how many carbohydrates you eat and drink each day. What lifestyle changes can be made?  Try to stay at a healthy weight. Talk to your doctor about what a good weight is for you.  Get at least 30 minutes of moderate physical activity at least 5 days a week. This can include: ? Fast walking. ? Biking. ? Swimming.  Do not use any products that have nicotine or tobacco. This includes cigarettes and e-cigarettes. If you need help quitting, ask your doctor. Avoid being around tobacco smoke in general.  Limit how much alcohol you drink to no more than 1 drink a day for nonpregnant women and 2 drinks a day for men. One drink equals 12 oz of beer, 5 oz of wine, or 1 oz of hard liquor.  Do not use drugs.  Avoid taking birth control pills. Talk to your doctor about the risks of taking birth control pills if: ? You are over 25 years old. ? You smoke. ? You get migraines. ? You have had a blood clot. What other changes can be made?  Manage your cholesterol. ? It is important to eat a healthy diet. ? If your cholesterol cannot be managed through your diet, you may  also need to take medicines. Take medicines as told by your doctor.  Manage your diabetes. ? It is important to eat a healthy diet and to exercise regularly. ? If your blood sugar cannot be managed through diet and exercise, you may need to take medicines. Take medicines as told by your doctor.  Control your high blood pressure (hypertension). ? Try to keep your blood pressure below 130/80. This can help lower your risk of stroke. ? It is important to eat a healthy diet and  to exercise regularly. ? If your blood pressure cannot be managed through diet and exercise, you may need to take medicines. Take medicines as told by your doctor. ? Ask your doctor if you should check your blood pressure at home. ? Have your blood pressure checked every year. Do this even if your blood pressure is normal.  Talk to your doctor about getting checked for a sleep disorder. Signs of this can include: ? Snoring a lot. ? Feeling very tired.  Take over-the-counter and prescription medicines only as told by your doctor. These may include aspirin or blood thinners (antiplatelets or anticoagulants).  Make sure that any other medical conditions you have are managed. Where to find more information  American Stroke Association: www.strokeassociation.org  National Stroke Association: www.stroke.org Get help right away if:  You have any symptoms of stroke. "BE FAST" is an easy way to remember the main warning signs: ? B - Balance. Signs are dizziness, sudden trouble walking, or loss of balance. ? E - Eyes. Signs are trouble seeing or a sudden change in how you see. ? F - Face. Signs are sudden weakness or loss of feeling of the face, or the face or eyelid drooping on one side. ? A - Arms. Signs are weakness or loss of feeling in an arm. This happens suddenly and usually on one side of the body. ? S - Speech. Signs are sudden trouble speaking, slurred speech, or trouble understanding what people say. ? T - Time. Time to call emergency services. Write down what time symptoms started.  You have other signs of stroke, such as: ? A sudden, very bad headache with no known cause. ? Feeling sick to your stomach (nausea). ? Throwing up (vomiting). ? Jerky movements you cannot control (seizure). These symptoms may represent a serious problem that is an emergency. Do not wait to see if the symptoms will go away. Get medical help right away. Call your local emergency services (911 in the [place]  Do not drive yourself to the hospital. Summary  You can prevent a stroke by eating healthy, exercising, not smoking, drinking less alcohol, and treating other health problems, such as diabetes, high blood pressure, or high cholesterol.  Do not use any products that contain nicotine or tobacco, such as cigarettes and e-cigarettes.  Get help right away if you have any signs or symptoms of a stroke. This information is not intended to replace advice given to you by your health care provider. Make sure you discuss any questions you have with your health care provider. Document Revised: 12/05/2018 Document Reviewed: 01/10/2017 Elsevier Patient Education  [place].   Sleep Apnea Sleep apnea affects breathing during sleep. It causes breathing to stop for a short time or to become shallow. It can also increase the risk of:  Heart attack.  Stroke.  Being very overweight (obese).  Diabetes.  Heart failure.  Irregular heartbeat. The goal of treatment is to help you breathe normally again. What  are the causes? There are three kinds of sleep apnea:  Obstructive sleep apnea. This is caused by a blocked or collapsed airway.  Central sleep apnea. This happens when the brain does not send the right signals to the muscles that control breathing.  Mixed sleep apnea. This is a combination of obstructive and central sleep apnea. The most common cause of this condition is a collapsed or blocked airway. This can happen if:  Your throat muscles are too relaxed.  Your tongue and tonsils are too large.  You are overweight.  Your airway is too small. What increases the risk?  Being overweight.  Smoking.  Having a small airway.  Being older.  Being female.  Drinking alcohol.  Taking medicines to calm yourself (sedatives or tranquilizers).  Having family members with the condition. What are the signs or symptoms?  Trouble staying asleep.  Being sleepy or tired during the  day.  Getting angry a lot.  Loud snoring.  Headaches in the morning.  Not being able to focus your mind (concentrate).  Forgetting things.  Less interest in sex.  Mood swings.  Personality changes.  Feelings of sadness (depression).  Waking up a lot during the night to pee (urinate).  Dry mouth.  Sore throat. How is this diagnosed?  Your medical history.  A physical exam.  A test that is done when you are sleeping (sleep study). The test is most often done in a sleep lab but may also be done at home. How is this treated?   Sleeping on your side.  Using a medicine to get rid of mucus in your nose (decongestant).  Avoiding the use of alcohol, medicines to help you relax, or certain pain medicines (narcotics).  Losing weight, if needed.  Changing your diet.  Not smoking.  Using a machine to open your airway while you sleep, such as: ? An oral appliance. This is a mouthpiece that shifts your lower jaw forward. ? A CPAP device. This device blows air through a mask when you breathe out (exhale). ? An EPAP device. This has valves that you put in each nostril. ? A BPAP device. This device blows air through a mask when you breathe in (inhale) and breathe out.  Having surgery if other treatments do not work. It is important to get treatment for sleep apnea. Without treatment, it can lead to:  High blood pressure.  Coronary artery disease.  In men, not being able to have an erection (impotence).  Reduced thinking ability. Follow these instructions at home: Lifestyle  Make changes that your doctor recommends.  Eat a healthy diet.  Lose weight if needed.  Avoid alcohol, medicines to help you relax, and some pain medicines.  Do not use any products that contain nicotine or tobacco, such as cigarettes, e-cigarettes, and chewing tobacco. If you need help quitting, ask your doctor. General instructions  Take over-the-counter and prescription medicines only  as told by your doctor.  If you were given a machine to use while you sleep, use it only as told by your doctor.  If you are having surgery, make sure to tell your doctor you have sleep apnea. You may need to bring your device with you.  Keep all follow-up visits as told by your doctor. This is important. Contact a doctor if:  The machine that you were given to use during sleep bothers you or does not seem to be working.  You do not get better.  You get worse. Get help  right away if:  Your chest hurts.  You have trouble breathing in enough air.  You have an uncomfortable feeling in your back, arms, or stomach.  You have trouble talking.  One side of your body feels weak.  A part of your face is hanging down. These symptoms may be an emergency. Do not wait to see if the symptoms will go away. Get medical help right away. Call your local emergency services (911 in the [place] Do not drive yourself to the hospital. Summary  This condition affects breathing during sleep.  The most common cause is a collapsed or blocked airway.  The goal of treatment is to help you breathe normally while you sleep. This information is not intended to replace advice given to you by your health care provider. Make sure you discuss any questions you have with your health care provider. Document Revised: 07/26/2018 Document Reviewed: 06/04/2018 Elsevier Patient Education  [place].

## 2020-02-06 DIAGNOSIS — E663 Overweight: Secondary | ICD-10-CM | POA: Diagnosis not present

## 2020-02-06 DIAGNOSIS — E7800 Pure hypercholesterolemia, unspecified: Secondary | ICD-10-CM | POA: Diagnosis not present

## 2020-02-06 DIAGNOSIS — I10 Essential (primary) hypertension: Secondary | ICD-10-CM | POA: Diagnosis not present

## 2020-02-06 DIAGNOSIS — E119 Type 2 diabetes mellitus without complications: Secondary | ICD-10-CM | POA: Diagnosis not present

## 2020-04-20 ENCOUNTER — Ambulatory Visit (INDEPENDENT_AMBULATORY_CARE_PROVIDER_SITE_OTHER): Payer: Medicare Other | Admitting: *Deleted

## 2020-04-20 DIAGNOSIS — R001 Bradycardia, unspecified: Secondary | ICD-10-CM | POA: Diagnosis not present

## 2020-04-20 LAB — CUP PACEART REMOTE DEVICE CHECK
Battery Remaining Longevity: 48 mo
Battery Remaining Percentage: 74 %
Brady Statistic RA Percent Paced: 0 %
Brady Statistic RV Percent Paced: 77 %
Date Time Interrogation Session: 20210629044200
Implantable Pulse Generator Implant Date: 20160513
Lead Channel Impedance Value: 342 Ohm
Lead Channel Impedance Value: 537 Ohm
Lead Channel Pacing Threshold Amplitude: 1.5 V
Lead Channel Pacing Threshold Pulse Width: 0.4 ms
Lead Channel Setting Pacing Amplitude: 1.7 V
Lead Channel Setting Pacing Pulse Width: 0.4 ms
Lead Channel Setting Sensing Sensitivity: 0.6 mV
Pulse Gen Serial Number: 732886

## 2020-04-22 NOTE — Progress Notes (Signed)
Remote pacemaker transmission.   

## 2020-05-25 DIAGNOSIS — M19012 Primary osteoarthritis, left shoulder: Secondary | ICD-10-CM | POA: Diagnosis not present

## 2020-05-25 DIAGNOSIS — M1711 Unilateral primary osteoarthritis, right knee: Secondary | ICD-10-CM | POA: Diagnosis not present

## 2020-05-25 DIAGNOSIS — M1712 Unilateral primary osteoarthritis, left knee: Secondary | ICD-10-CM | POA: Diagnosis not present

## 2020-05-25 DIAGNOSIS — M19011 Primary osteoarthritis, right shoulder: Secondary | ICD-10-CM | POA: Diagnosis not present

## 2020-05-25 DIAGNOSIS — M170 Bilateral primary osteoarthritis of knee: Secondary | ICD-10-CM | POA: Diagnosis not present

## 2020-06-02 DIAGNOSIS — E1165 Type 2 diabetes mellitus with hyperglycemia: Secondary | ICD-10-CM | POA: Diagnosis not present

## 2020-06-02 DIAGNOSIS — I10 Essential (primary) hypertension: Secondary | ICD-10-CM | POA: Diagnosis not present

## 2020-06-02 DIAGNOSIS — K219 Gastro-esophageal reflux disease without esophagitis: Secondary | ICD-10-CM | POA: Diagnosis not present

## 2020-06-02 DIAGNOSIS — Z713 Dietary counseling and surveillance: Secondary | ICD-10-CM | POA: Diagnosis not present

## 2020-06-02 DIAGNOSIS — E782 Mixed hyperlipidemia: Secondary | ICD-10-CM | POA: Diagnosis not present

## 2020-06-15 DIAGNOSIS — M170 Bilateral primary osteoarthritis of knee: Secondary | ICD-10-CM | POA: Diagnosis not present

## 2020-06-16 DIAGNOSIS — I10 Essential (primary) hypertension: Secondary | ICD-10-CM | POA: Diagnosis not present

## 2020-06-16 DIAGNOSIS — E1165 Type 2 diabetes mellitus with hyperglycemia: Secondary | ICD-10-CM | POA: Diagnosis not present

## 2020-06-22 DIAGNOSIS — M170 Bilateral primary osteoarthritis of knee: Secondary | ICD-10-CM | POA: Diagnosis not present

## 2020-06-30 DIAGNOSIS — M170 Bilateral primary osteoarthritis of knee: Secondary | ICD-10-CM | POA: Diagnosis not present

## 2020-06-30 DIAGNOSIS — Z23 Encounter for immunization: Secondary | ICD-10-CM | POA: Diagnosis not present

## 2020-07-20 ENCOUNTER — Ambulatory Visit (INDEPENDENT_AMBULATORY_CARE_PROVIDER_SITE_OTHER): Payer: Medicare Other | Admitting: Emergency Medicine

## 2020-07-20 DIAGNOSIS — I4821 Permanent atrial fibrillation: Secondary | ICD-10-CM

## 2020-07-20 LAB — CUP PACEART REMOTE DEVICE CHECK
Battery Remaining Longevity: 42 mo
Battery Remaining Percentage: 68 %
Brady Statistic RA Percent Paced: 0 %
Brady Statistic RV Percent Paced: 76 %
Date Time Interrogation Session: 20210928044100
Implantable Pulse Generator Implant Date: 20160513
Lead Channel Impedance Value: 356 Ohm
Lead Channel Impedance Value: 632 Ohm
Lead Channel Pacing Threshold Amplitude: 1.4 V
Lead Channel Pacing Threshold Pulse Width: 0.4 ms
Lead Channel Setting Pacing Amplitude: 1.8 V
Lead Channel Setting Pacing Pulse Width: 0.4 ms
Lead Channel Setting Sensing Sensitivity: 0.6 mV
Pulse Gen Serial Number: 732886

## 2020-07-23 NOTE — Progress Notes (Signed)
Remote pacemaker transmission.   

## 2020-08-16 DIAGNOSIS — E119 Type 2 diabetes mellitus without complications: Secondary | ICD-10-CM | POA: Diagnosis not present

## 2020-08-16 DIAGNOSIS — H2513 Age-related nuclear cataract, bilateral: Secondary | ICD-10-CM | POA: Diagnosis not present

## 2020-08-16 DIAGNOSIS — H524 Presbyopia: Secondary | ICD-10-CM | POA: Diagnosis not present

## 2020-08-18 DIAGNOSIS — Z1339 Encounter for screening examination for other mental health and behavioral disorders: Secondary | ICD-10-CM | POA: Diagnosis not present

## 2020-08-18 DIAGNOSIS — Z0000 Encounter for general adult medical examination without abnormal findings: Secondary | ICD-10-CM | POA: Diagnosis not present

## 2020-08-18 DIAGNOSIS — Z1331 Encounter for screening for depression: Secondary | ICD-10-CM | POA: Diagnosis not present

## 2020-08-23 ENCOUNTER — Telehealth: Payer: Self-pay | Admitting: Family Medicine

## 2020-08-23 NOTE — Telephone Encounter (Signed)
Pt called stating that she has been having a lot of bruising and she is thinking the clopidogrel (PLAVIX) 75 MG tablet is the cause of it. Please advise.

## 2020-08-23 NOTE — Telephone Encounter (Addendum)
I called pt and she is tired of taking plavix and having bruising that is bothersome. Is there something else? She states that she has a heart filter that takes care of clots, wants to know if can take something else that does not cause bruising/bleeding.

## 2020-08-24 NOTE — Telephone Encounter (Signed)
LMVM for pt to return call after 1300.

## 2020-08-24 NOTE — Telephone Encounter (Signed)
Please let her know that I have discussed concerns with Dr Jaynee Eagles.  She should speak with cardiology regarding the need for Plavix or other agents due to history of cardiac stents. If they agree to discontinue Plavix, she may discontinue. From a neurologic perspective, she can start aspirin 81mg  daily and discontinue Plavix.

## 2020-08-24 NOTE — Telephone Encounter (Signed)
Pt returned call.  I relayed form our neuro perspective she can discontinue plavix and take 81mg  aspirin, but because of her cardiac stents (watchman) that she needs to f/u with cardiology about her concerns on plavix and if they feel like she needs this or another type of blood thinner. She did verbalize understanding.

## 2020-08-27 ENCOUNTER — Telehealth: Payer: Self-pay | Admitting: Cardiology

## 2020-08-27 DIAGNOSIS — I4821 Permanent atrial fibrillation: Secondary | ICD-10-CM

## 2020-08-27 NOTE — Telephone Encounter (Signed)
New Message:     Pt wants to know if she can take something other than Plavix please?

## 2020-08-27 NOTE — Telephone Encounter (Signed)
Returned the patients call regarding her Plavix. She states that she looks like she has been beaten up due to all the bruising that the blood thinner has caused.   Plavix prescribed by Melvenia Beam, MD, the patients neurologist who suggested she reach out to Dr. Marlou Porch to see if there is an alternative to the Plavix that will help with the bruising.

## 2020-08-31 NOTE — Telephone Encounter (Signed)
Lets stop the Plavix 75 and transition over to aspirin 81 mg. Previously we decided upon Plavix monotherapy because of her previous watchman device. This was placed however in 2016. Lets see how she does with aspirin 81 mg in regards to her bruising. If she does not have a CBC recently, please check. Candee Furbish, MD

## 2020-09-01 ENCOUNTER — Other Ambulatory Visit: Payer: Self-pay | Admitting: Family Medicine

## 2020-09-01 ENCOUNTER — Telehealth: Payer: Self-pay | Admitting: Cardiology

## 2020-09-01 DIAGNOSIS — Z1231 Encounter for screening mammogram for malignant neoplasm of breast: Secondary | ICD-10-CM

## 2020-09-01 DIAGNOSIS — E2839 Other primary ovarian failure: Secondary | ICD-10-CM

## 2020-09-01 NOTE — Telephone Encounter (Signed)
See previous phone note for more details.  CBC ordered and scheduled pt to come in Monday for labs.

## 2020-09-01 NOTE — Telephone Encounter (Signed)
Spoke with pt and made her aware of information below.  Pt will come Monday for labs.  Pt appreciative for call.

## 2020-09-01 NOTE — Addendum Note (Signed)
Addended by: Loren Racer on: 09/01/2020 03:09 PM   Modules accepted: Orders

## 2020-09-01 NOTE — Telephone Encounter (Signed)
LM for pt on VM of Dr Marlou Porch order to d/c Plavix and take ASA 81 mg a day.  Requested pt to call back to schedule for CBC since we do not have a recent one in our results.  If pt has had recent blood work somewhere else that includes a CBC, we can use that one if we can obtain the results.

## 2020-09-01 NOTE — Telephone Encounter (Signed)
Pt was told by Dr. Marlou Porch to have a full blood workup. No orders have been put in to the patient's chart. Please put in orders so patient can be scheduled

## 2020-09-06 ENCOUNTER — Other Ambulatory Visit: Payer: Self-pay

## 2020-09-06 ENCOUNTER — Other Ambulatory Visit: Payer: Medicare Other

## 2020-09-06 DIAGNOSIS — I4821 Permanent atrial fibrillation: Secondary | ICD-10-CM | POA: Diagnosis not present

## 2020-09-06 LAB — CBC
Hematocrit: 37.5 % (ref 34.0–46.6)
Hemoglobin: 12.6 g/dL (ref 11.1–15.9)
MCH: 33.4 pg — ABNORMAL HIGH (ref 26.6–33.0)
MCHC: 33.6 g/dL (ref 31.5–35.7)
MCV: 100 fL — ABNORMAL HIGH (ref 79–97)
Platelets: 212 10*3/uL (ref 150–450)
RBC: 3.77 x10E6/uL (ref 3.77–5.28)
RDW: 12.7 % (ref 11.7–15.4)
WBC: 6.3 10*3/uL (ref 3.4–10.8)

## 2020-09-09 MED ORDER — ASPIRIN EC 81 MG PO TBEC: 81.0000 mg | DELAYED_RELEASE_TABLET | Freq: Every day | ORAL | 11 refills | Status: DC

## 2020-09-09 NOTE — Addendum Note (Signed)
Addended by: Shellia Cleverly on: 09/09/2020 05:15 PM   Modules accepted: Orders

## 2020-09-09 NOTE — Telephone Encounter (Signed)
Spoke with patient and answered all questions r/t discontinuing Plavix and starting ASA 81 mg daily.  Pt was grateful for the call back and information.

## 2020-09-09 NOTE — Telephone Encounter (Signed)
Patient states she is returning a call from a while ago, because she did not check her messages. Informed patient of the note below. Patient states she would like a nurse to call her back to discuss Dr. Marlou Porch recommendation on the plavix.

## 2020-09-13 DIAGNOSIS — E1165 Type 2 diabetes mellitus with hyperglycemia: Secondary | ICD-10-CM | POA: Diagnosis not present

## 2020-09-13 DIAGNOSIS — I10 Essential (primary) hypertension: Secondary | ICD-10-CM | POA: Diagnosis not present

## 2020-09-13 DIAGNOSIS — E11319 Type 2 diabetes mellitus with unspecified diabetic retinopathy without macular edema: Secondary | ICD-10-CM | POA: Diagnosis not present

## 2020-09-13 DIAGNOSIS — I519 Heart disease, unspecified: Secondary | ICD-10-CM | POA: Diagnosis not present

## 2020-09-13 DIAGNOSIS — I639 Cerebral infarction, unspecified: Secondary | ICD-10-CM | POA: Diagnosis not present

## 2020-09-13 DIAGNOSIS — E782 Mixed hyperlipidemia: Secondary | ICD-10-CM | POA: Diagnosis not present

## 2020-09-13 DIAGNOSIS — E559 Vitamin D deficiency, unspecified: Secondary | ICD-10-CM | POA: Diagnosis not present

## 2020-09-13 DIAGNOSIS — E118 Type 2 diabetes mellitus with unspecified complications: Secondary | ICD-10-CM | POA: Diagnosis not present

## 2020-09-27 DIAGNOSIS — E162 Hypoglycemia, unspecified: Secondary | ICD-10-CM | POA: Diagnosis not present

## 2020-09-27 DIAGNOSIS — E7800 Pure hypercholesterolemia, unspecified: Secondary | ICD-10-CM | POA: Diagnosis not present

## 2020-09-27 DIAGNOSIS — E559 Vitamin D deficiency, unspecified: Secondary | ICD-10-CM | POA: Diagnosis not present

## 2020-09-29 DIAGNOSIS — I152 Hypertension secondary to endocrine disorders: Secondary | ICD-10-CM | POA: Diagnosis not present

## 2020-09-29 DIAGNOSIS — E11319 Type 2 diabetes mellitus with unspecified diabetic retinopathy without macular edema: Secondary | ICD-10-CM | POA: Diagnosis not present

## 2020-09-29 DIAGNOSIS — E782 Mixed hyperlipidemia: Secondary | ICD-10-CM | POA: Diagnosis not present

## 2020-09-29 DIAGNOSIS — E1165 Type 2 diabetes mellitus with hyperglycemia: Secondary | ICD-10-CM | POA: Diagnosis not present

## 2020-09-29 DIAGNOSIS — G4733 Obstructive sleep apnea (adult) (pediatric): Secondary | ICD-10-CM | POA: Diagnosis not present

## 2020-09-29 DIAGNOSIS — E559 Vitamin D deficiency, unspecified: Secondary | ICD-10-CM | POA: Diagnosis not present

## 2020-09-29 DIAGNOSIS — M170 Bilateral primary osteoarthritis of knee: Secondary | ICD-10-CM | POA: Diagnosis not present

## 2020-10-08 ENCOUNTER — Ambulatory Visit: Payer: Medicare Other | Admitting: Gastroenterology

## 2020-10-08 NOTE — Progress Notes (Deleted)
[facility] Progress Note  Chief Complaint: ***  Subjective  History:  *** From July 2020 colonoscopy report: The digital rectal exam findings include decreased sphincter tone. - Three sessile polyps were found in the transverse colon. The polyps were 3 to 5 mm in size. These polyps were removed with a cold snare. Resection and retrieval were complete. - A 4 mm polyp was found in the descending colon. The polyp was sessile. The polyp was removed with a cold snare. Resection and retrieval were complete. - Many diverticula were found in the left colon and right colon (L>R, with tortuosity) - Retroflexion in the rectum was not performed due to anatomy. - The exam was otherwise without abnormality  (Note - fair prep on that exam)  ___________________  ROS: Cardiovascular:  no chest pain Respiratory: no dyspnea  The patient's Past Medical, Family and Social History were reviewed and are on file in the EMR.  Recent communications with her cardiologist Marlou Porch) indicates she was having bruising from Plavix, so that medicine was discontinued and aspirin started Objective:  Med list reviewed  Current Outpatient Medications:  .  aspirin EC 81 MG tablet, Take 1 tablet (81 mg total) by mouth daily. Swallow whole., Disp: 30 tablet, Rfl: 11 .  benazepril (LOTENSIN) 20 MG tablet, Take 20 mg by mouth daily., Disp: , Rfl:  .  diltiazem (CARDIZEM) 30 MG tablet, TAKE 1 TABLET(30 MG) BY MOUTH DAILY AS NEEDED, Disp: 30 tablet, Rfl: 8 .  hydrochlorothiazide (HYDRODIURIL) 25 MG tablet, Take 25 mg by mouth daily. , Disp: , Rfl:  .  LEVEMIR FLEXTOUCH 100 UNIT/ML Pen, , Disp: , Rfl:  .  lovastatin (MEVACOR) 40 MG tablet, Take 40 mg by mouth at bedtime., Disp: , Rfl:  .  Multiple Vitamin (MULTIVITAMIN) tablet, Take 1 tablet by mouth daily., Disp: , Rfl:  .  nitroGLYCERIN (NITROSTAT) 0.4 MG SL tablet, Place 1 tablet (0.4 mg total) under the tongue every 5 (five) minutes as needed for chest  pain., Disp: 25 tablet, Rfl: 1 .  ondansetron (ZOFRAN) 4 MG tablet, Take 4 mg by mouth every 8 (eight) hours as needed for nausea or vomiting., Disp: , Rfl:  .  pioglitazone (ACTOS) 30 MG tablet, Take 30 mg by mouth daily., Disp: , Rfl:  .  Probiotic Product (ALIGN PO), Take by mouth 2 (two) times a day., Disp: , Rfl:  .  TRULICITY 4.43 XV/4.0GQ SOPN, SMARTSIG:0.5 Milliliter(s) SUB-Q Once a Week, Disp: , Rfl:    Vital signs in last 24 hrs: There were no vitals filed for this visit.  Physical Exam  ***  HEENT: sclera anicteric, oral mucosa moist without lesions  Neck: supple, no thyromegaly, JVD or lymphadenopathy  Cardiac: RRR without murmurs, S1S2 heard, no peripheral edema  Pulm: clear to auscultation bilaterally, normal RR and effort noted  Abdomen: soft, *** tenderness, with active bowel sounds. No guarding or palpable hepatosplenomegaly.  Skin; warm and dry, no jaundice or rash  Labs:   ___________________________________________ Radiologic studies:   ____________________________________________ Other: 1. Surgical [P], colon, transverse, polyp (3) - TUBULAR ADENOMA (3 OF 4 FRAGMENTS) - SESSILE SERRATED POLYP (1 OF 4 FRAGMENTS) - NO HIGH GRADE DYSPLASIA OR MALIGNANCY IDENTIFIED 2. Surgical [P], colon, descending, polyp - BENIGN COLONIC MUCOSA (1 OF 1 FRAGMENTS) - NO HIGH GRADE DYSPLASIA OR MALIGNANCY IDENTIFIED _____________________________________________ Assessment & Plan  Assessment: No diagnosis found.    Plan:    *** minutes were spent on this encounter (including chart review, history/exam,  counseling/coordination of care, and documentation)  Nelida Meuse III

## 2020-10-13 ENCOUNTER — Ambulatory Visit: Payer: Medicare Other

## 2020-10-19 ENCOUNTER — Ambulatory Visit (INDEPENDENT_AMBULATORY_CARE_PROVIDER_SITE_OTHER): Payer: Medicare Other

## 2020-10-19 DIAGNOSIS — I4821 Permanent atrial fibrillation: Secondary | ICD-10-CM

## 2020-10-20 LAB — CUP PACEART REMOTE DEVICE CHECK
Battery Remaining Longevity: 36 mo
Battery Remaining Percentage: 61 %
Brady Statistic RA Percent Paced: 0 %
Brady Statistic RV Percent Paced: 74 %
Date Time Interrogation Session: 20211228044200
Implantable Pulse Generator Implant Date: 20160513
Lead Channel Impedance Value: 339 Ohm
Lead Channel Impedance Value: 594 Ohm
Lead Channel Pacing Threshold Amplitude: 1.2 V
Lead Channel Pacing Threshold Pulse Width: 0.4 ms
Lead Channel Setting Pacing Amplitude: 1.7 V
Lead Channel Setting Pacing Pulse Width: 0.4 ms
Lead Channel Setting Sensing Sensitivity: 0.6 mV
Pulse Gen Serial Number: 732886

## 2020-10-27 DIAGNOSIS — E782 Mixed hyperlipidemia: Secondary | ICD-10-CM | POA: Diagnosis not present

## 2020-10-27 DIAGNOSIS — M1711 Unilateral primary osteoarthritis, right knee: Secondary | ICD-10-CM | POA: Diagnosis not present

## 2020-10-27 DIAGNOSIS — M1712 Unilateral primary osteoarthritis, left knee: Secondary | ICD-10-CM | POA: Diagnosis not present

## 2020-10-27 DIAGNOSIS — M170 Bilateral primary osteoarthritis of knee: Secondary | ICD-10-CM | POA: Diagnosis not present

## 2020-10-27 DIAGNOSIS — E1165 Type 2 diabetes mellitus with hyperglycemia: Secondary | ICD-10-CM | POA: Diagnosis not present

## 2020-10-27 DIAGNOSIS — Z6841 Body Mass Index (BMI) 40.0 and over, adult: Secondary | ICD-10-CM | POA: Diagnosis not present

## 2020-10-27 DIAGNOSIS — E1169 Type 2 diabetes mellitus with other specified complication: Secondary | ICD-10-CM | POA: Diagnosis not present

## 2020-11-02 NOTE — Progress Notes (Signed)
Remote pacemaker transmission.   

## 2020-11-16 ENCOUNTER — Ambulatory Visit
Admission: RE | Admit: 2020-11-16 | Discharge: 2020-11-16 | Disposition: A | Discharge status: Home / Self Care | Payer: Medicare Other | Source: Ambulatory Visit | Attending: Family Medicine | Admitting: Family Medicine

## 2020-11-16 ENCOUNTER — Other Ambulatory Visit: Payer: Self-pay

## 2020-11-16 DIAGNOSIS — Z1231 Encounter for screening mammogram for malignant neoplasm of breast: Secondary | ICD-10-CM | POA: Diagnosis not present

## 2020-11-25 ENCOUNTER — Other Ambulatory Visit: Payer: Self-pay

## 2020-11-25 ENCOUNTER — Ambulatory Visit (INDEPENDENT_AMBULATORY_CARE_PROVIDER_SITE_OTHER): Payer: Medicare Other | Admitting: Gastroenterology

## 2020-11-25 ENCOUNTER — Encounter: Payer: Self-pay | Admitting: Gastroenterology

## 2020-11-25 VITALS — BP 120/60 | HR 60 | Ht 66.0 in | Wt 235.2 lb

## 2020-11-25 DIAGNOSIS — Z86010 Personal history of colonic polyps: Secondary | ICD-10-CM

## 2020-11-25 DIAGNOSIS — I4819 Other persistent atrial fibrillation: Secondary | ICD-10-CM

## 2020-11-25 MED ORDER — PLENVU 140 G PO SOLR: 140.0000 g | ORAL | 0 refills | Status: DC

## 2020-11-25 NOTE — Progress Notes (Addendum)
Heather May [facility] Progress Note  Chief Complaint: History of colon polyp  Subjective  History:  Heather May sees me for history of colon polyps. Colonoscopy July 2019 had over 10 adenomas including one that required removal by EMR. Follow-up colonoscopy 05/23/2019 with 3 small adenomatous and serrated polyps as well as diverticulosis.  At that time she was on Plavix for history of cerebrovascular disease.  Last year she asked her cardiologist to change her from Plavix to aspirin due to frequent bruising. She has occasional constipation, denies rectal bleeding.  Appetite is good and weight stable.  She denies dysphagia or odynophagia  ROS: Cardiovascular:  no chest pain Respiratory: no dyspnea Resting tremor of head and right hand Arthralgias Balance and mobility issues, uses a walker. Remainder of systems negative except as above  The patient's Past Medical, Family and Social History were reviewed and are on file in the EMR. Past Medical History:  Diagnosis Date  . Arthritis    joints  . Blood transfusion without reported diagnosis    "when oldest daughter was born"  . CAD (coronary artery disease) 08/15/13  . Diabetes mellitus without complication ([place])   . Diverticulosis   . Essential tremor   . GERD (gastroesophageal reflux disease)   . History of long-term use of multiple prescription drugs   . Hyperlipidemia    mixed  . Hypertension   . Long-term (current) use of anticoagulants   . PAD (peripheral artery disease) ([place])   . Percutaneous transluminal coronary angioplasty status   . Permanent atrial fibrillation (HCC)    chads2vasc score is at least 5  . Pulmonary embolism ([place])   . Pulmonary fibrosis ([place])   . Seizure ([place]) 2014   occured in setting of [place]  . Sick sinus syndrome ([place])   . Spinal stenosis   . Stroke ([place])   . Subarachnoid hemorrhage ([place])   . Transient ischemic attack    From last cardiology office note 01/07/2020: "Zofia Najera is a 75 y.o. female  here for the follow-up of atrial fibrillation.  Last seen by Dr. Rayann Heman on 12/31/2019 via telemedicine visit.  She was doing well.     She continues to take Plavix monotherapy given her previous stent placements.  We decided upon Plavix monotherapy instead of aspirin monotherapy with her watchman device.  She does have some easy bruising.  I did explain that she does not need to be on anticoagulation with Coumadin Xarelto or Eliquis."   Objective:  Med list reviewed  Current Outpatient Medications:  .  aspirin EC 81 MG tablet, Take 1 tablet (81 mg total) by mouth daily. Swallow whole., Disp: 30 tablet, Rfl: 11 .  benazepril (LOTENSIN) 20 MG tablet, Take 20 mg by mouth daily., Disp: , Rfl:  .  Cholecalciferol (VITAMIN D3) 75 MCG (3000 UT) TABS, Take 1 tablet by mouth daily., Disp: , Rfl:  .  diltiazem (CARDIZEM) 30 MG tablet, TAKE 1 TABLET(30 MG) BY MOUTH DAILY AS NEEDED, Disp: 30 tablet, Rfl: 8 .  hydrochlorothiazide (HYDRODIURIL) 25 MG tablet, Take 25 mg by mouth daily. , Disp: , Rfl:  .  LEVEMIR FLEXTOUCH 100 UNIT/ML Pen, , Disp: , Rfl:  .  lovastatin (MEVACOR) 40 MG tablet, Take 40 mg by mouth at bedtime., Disp: , Rfl:  .  Multiple Vitamin (MULTIVITAMIN) tablet, Take 1 tablet by mouth daily., Disp: , Rfl:  .  nitroGLYCERIN (NITROSTAT) 0.4 MG SL tablet, Place 1 tablet (0.4 mg total) under the tongue every 5 (five) minutes  as needed for chest pain., Disp: 25 tablet, Rfl: 1 .  ondansetron (ZOFRAN) 4 MG tablet, Take 4 mg by mouth every 8 (eight) hours as needed for nausea or vomiting., Disp: , Rfl:  .  Probiotic Product (PROBIOTIC PO), Take 1 capsule by mouth daily., Disp: , Rfl:  .  TRULICITY 0.09 FG/1.8EX SOPN, SMARTSIG:0.5 Milliliter(s) SUB-Q Once a Week, Disp: , Rfl:    Vital signs in last 24 hrs: Vitals:   11/25/20 0918  BP: 120/60  Pulse: 60   Wt Readings from Last 3 Encounters:  11/25/20 235 lb 3.2 oz (106.7 kg)  02/05/20 241 lb 6.4 oz (109.5 kg)  01/07/20 239 lb (108.4 kg)     Physical Exam  Able to get on exam table with assistance  HEENT: sclera anicteric, oral mucosa moist without lesions  Neck: supple, no thyromegaly, JVD or lymphadenopathy  Cardiac: Irregular without murmurs, S1S2 heard, no peripheral edema.  Pacemaker upper chest wall  Pulm: clear to auscultation bilaterally, normal RR and effort noted  Abdomen: soft, no tenderness, with active bowel sounds. No guarding or palpable hepatosplenomegaly.  (Limited by body habitus)  Skin; warm and dry, no jaundice or rash  Labs:   ___________________________________________ Radiologic studies:   ____________________________________________ Other: Last echocardiogram 01/26/2020 1. Left ventricular ejection fraction, by estimation, is 60 to 65%. The  left ventricle has normal function. The left ventricle has no regional  wall motion abnormalities. Left ventricular diastolic function could not  be evaluated.   2. Right ventricular systolic function is normal. The right ventricular  size is normal. There is mildly elevated pulmonary artery systolic  pressure.   3. Right atrial size was mildly dilated.   4. The mitral valve is normal in structure. Mild mitral valve  regurgitation. No evidence of mitral stenosis.   5. The aortic valve is tricuspid. Aortic valve regurgitation is mild.  Mild aortic valve sclerosis is present, with no evidence of aortic valve  stenosis.   6. Aortic dilatation noted. 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   (Cardiology planned repeat echocardiogram in a year for surveillance of aortic finding) _____________________________________________ Assessment & Plan  Assessment: Encounter Diagnoses  Name Primary?  . Personal history of colonic polyps Yes  . Persistent atrial fibrillation (HCC)    Also coronary artery disease with prior stenting.  Now on aspirin monotherapy.  Does not require Zinc because she has a watchman  device  She has a significant history of polyps, including recurrent polyps 1 year out from the 2019 exam.  I recommended surveillance colonoscopy and she was agreeable after discussion of procedure and risks.  She feels that she could manage the preparation, and that her partner Silva Bandy could bring her for the procedure.  Unless she has high risk findings on this upcoming exam, I am hopeful this could be her last surveillance colonoscopy.   The benefits and risks of the planned procedure were described in detail with the patient or (when appropriate) their health care proxy.  Risks were outlined as including, but not limited to, bleeding, infection, perforation, adverse medication reaction leading to cardiac or pulmonary decompensation, pancreatitis (if ERCP).  The limitation of incomplete mucosal visualization was also discussed.  No guarantees or warranties were given. Patient at increased risk for cardiopulmonary complications of procedure due to medical comorbidities.  Nelida Meuse III

## 2020-11-25 NOTE — Patient Instructions (Signed)
If you are age 77 or older, your body mass index should be between 23-30. Your Body mass index is 37.96 kg/m. If this is out of the aforementioned range listed, please consider follow up with your Primary Care Provider.  If you are age 72 or younger, your body mass index should be between 19-25. Your Body mass index is 37.96 kg/m. If this is out of the aformentioned range listed, please consider follow up with your Primary Care Provider.   You have been scheduled for a colonoscopy. Please follow written instructions given to you at your visit today.  Please pick up your prep supplies at the pharmacy within the next 1-3 days. If you use inhalers (even only as needed), please bring them with you on the day of your procedure.  Due to recent changes in healthcare laws, you may see the results of your imaging and laboratory studies on MyChart before your provider has had a chance to review them.  We understand that in some cases there may be results that are confusing or concerning to you. Not all laboratory results come back in the same time frame and the provider may be waiting for multiple results in order to interpret others.  Please give [place] 48 hours in order for your provider to thoroughly review all the results before contacting the office for clarification of your results.   It was a pleasure to see you today!  Dr. Loletha Carrow

## 2020-12-01 DIAGNOSIS — B351 Tinea unguium: Secondary | ICD-10-CM | POA: Diagnosis not present

## 2020-12-01 DIAGNOSIS — E782 Mixed hyperlipidemia: Secondary | ICD-10-CM | POA: Diagnosis not present

## 2020-12-01 DIAGNOSIS — E1165 Type 2 diabetes mellitus with hyperglycemia: Secondary | ICD-10-CM | POA: Diagnosis not present

## 2020-12-01 DIAGNOSIS — M170 Bilateral primary osteoarthritis of knee: Secondary | ICD-10-CM | POA: Diagnosis not present

## 2020-12-01 DIAGNOSIS — I152 Hypertension secondary to endocrine disorders: Secondary | ICD-10-CM | POA: Diagnosis not present

## 2020-12-14 ENCOUNTER — Other Ambulatory Visit: Payer: Self-pay

## 2020-12-14 ENCOUNTER — Ambulatory Visit
Admission: RE | Admit: 2020-12-14 | Discharge: 2020-12-14 | Disposition: A | Discharge status: Home / Self Care | Payer: Medicare Other | Source: Ambulatory Visit | Attending: Family Medicine | Admitting: Family Medicine

## 2020-12-14 DIAGNOSIS — E2839 Other primary ovarian failure: Secondary | ICD-10-CM

## 2020-12-14 DIAGNOSIS — Z780 Asymptomatic menopausal state: Secondary | ICD-10-CM | POA: Diagnosis not present

## 2020-12-22 DIAGNOSIS — E559 Vitamin D deficiency, unspecified: Secondary | ICD-10-CM | POA: Diagnosis not present

## 2020-12-22 DIAGNOSIS — M170 Bilateral primary osteoarthritis of knee: Secondary | ICD-10-CM | POA: Diagnosis not present

## 2020-12-22 DIAGNOSIS — E782 Mixed hyperlipidemia: Secondary | ICD-10-CM | POA: Diagnosis not present

## 2020-12-22 DIAGNOSIS — E1165 Type 2 diabetes mellitus with hyperglycemia: Secondary | ICD-10-CM | POA: Diagnosis not present

## 2021-01-03 ENCOUNTER — Telehealth: Payer: Self-pay | Admitting: Gastroenterology

## 2021-01-03 ENCOUNTER — Ambulatory Visit: Payer: Medicare Other | Admitting: Podiatry

## 2021-01-03 ENCOUNTER — Encounter: Payer: Self-pay | Admitting: Gastroenterology

## 2021-01-03 NOTE — Telephone Encounter (Signed)
Received call from patient. She is scheduled for colonoscopy with Dr. Loletha Carrow on 01/04/2021. She is taking Plenvu as her preparation. She vomited up the first half of her Plenvu this afternoon. She called for instruction on next steps. She has Zofran at home for nausea if necessary but was not able to get to it in time. Plan for her/her roommate to obtain MiraLAX and mix 7 packets or 7 capfuls with a 32 ounce bottle of Gatorade or Powerade this evening.   She would drink that over the course the next 2 hours. She will take her Plenvu as already instructed in the morning. She will take the Dulcolax as per instructions. She will take Zofran 30 minutes before the repeat attempt this evening and 30 minutes before the planned Plenvu intake tomorrow morning.   Hopefully this will allow her to tolerate the preparation. I will forward this to Dr. Loletha Carrow and the [facility] nurses. Patient appreciative for the call back and information.  Justice Britain, MD [facility] Office # [phone]

## 2021-01-04 ENCOUNTER — Encounter: Payer: Self-pay | Admitting: Gastroenterology

## 2021-01-04 ENCOUNTER — Other Ambulatory Visit: Payer: Self-pay

## 2021-01-04 ENCOUNTER — Ambulatory Visit (AMBULATORY_SURGERY_CENTER): Payer: Medicare Other | Admitting: Gastroenterology

## 2021-01-04 VITALS — BP 128/62 | HR 65 | Temp 97.0°F | Resp 9 | Ht 66.0 in | Wt 235.0 lb

## 2021-01-04 DIAGNOSIS — D122 Benign neoplasm of ascending colon: Secondary | ICD-10-CM | POA: Diagnosis not present

## 2021-01-04 DIAGNOSIS — D123 Benign neoplasm of transverse colon: Secondary | ICD-10-CM | POA: Diagnosis not present

## 2021-01-04 DIAGNOSIS — Z86010 Personal history of colonic polyps: Secondary | ICD-10-CM

## 2021-01-04 DIAGNOSIS — D124 Benign neoplasm of descending colon: Secondary | ICD-10-CM | POA: Diagnosis not present

## 2021-01-04 MED ORDER — SODIUM CHLORIDE 0.9 % IV SOLN: 500.0000 mL | Freq: Once | INTRAVENOUS | Status: DC

## 2021-01-04 NOTE — Progress Notes (Signed)
Pt's states no medical or surgical changes since previsit or office visit. 

## 2021-01-04 NOTE — Patient Instructions (Signed)
Handouts provided on polyps and diverticulosis.  ? ?YOU HAD AN ENDOSCOPIC PROCEDURE TODAY AT THE [facility]:   Refer to the procedure report that was given to you for any specific questions about what was found during the examination.  If the procedure report does not answer your questions, please call your gastroenterologist to clarify.  If you requested that your care partner not be given the details of your procedure findings, then the procedure report has been included in a sealed envelope for you to review at your convenience later. ? ?YOU SHOULD EXPECT: Some feelings of bloating in the abdomen. Passage of more gas than usual.  Walking can help get rid of the air that was put into your GI tract during the procedure and reduce the bloating. If you had a lower endoscopy (such as a colonoscopy or flexible sigmoidoscopy) you may notice spotting of blood in your stool or on the toilet paper. If you underwent a bowel prep for your procedure, you may not have a normal bowel movement for a few days. ? ?Please Note:  You might notice some irritation and congestion in your nose or some drainage.  This is from the oxygen used during your procedure.  There is no need for concern and it should clear up in a day or so. ? ?SYMPTOMS TO REPORT IMMEDIATELY: ? ?Following lower endoscopy (colonoscopy or flexible sigmoidoscopy): ? Excessive amounts of blood in the stool ? Significant tenderness or worsening of abdominal pains ? Swelling of the abdomen that is new, acute ? Fever of 100?F or higher ? ?For urgent or emergent issues, a gastroenterologist can be reached at any hour by calling [phone]. ?Do not use MyChart messaging for urgent concerns.  ? ? ?DIET:  We do recommend a small meal at first, but then you may proceed to your regular diet.  Drink plenty of fluids but you should avoid alcoholic beverages for 24 hours. ? ?ACTIVITY:  You should plan to take it easy for the rest of today and you should NOT  DRIVE or use heavy machinery until tomorrow (because of the sedation medicines used during the test).   ? ?FOLLOW UP: ?Our staff will call the number listed on your records 48-72 hours following your procedure to check on you and address any questions or concerns that you may have regarding the information given to you following your procedure. If we do not reach you, we will leave a message.  We will attempt to reach you two times.  During this call, we will ask if you have developed any symptoms of COVID 19. If you develop any symptoms (ie: fever, flu-like symptoms, shortness of breath, cough etc.) before then, please call [phone].  If you test positive for Covid 19 in the 2 weeks post procedure, please call and report this information to us.   ? ?If any biopsies were taken you will be contacted by phone or by letter within the next 1-3 weeks.  Please call us at [phone] if you have not heard about the biopsies in 3 weeks.  ? ? ?SIGNATURES/CONFIDENTIALITY: ?You and/or your care partner have signed paperwork which will be entered into your electronic medical record.  These signatures attest to the fact that that the information above on your After Visit Summary has been reviewed and is understood.  Full responsibility of the confidentiality of this discharge information lies with you and/or your care-partner. ? ?

## 2021-01-04 NOTE — Op Note (Signed)
[place] Patient Name: Heather May Procedure Date: 01/04/2021 10:29 AM MRN: [id number] Endoscopist: Mallie Mussel L. Loletha Carrow , MD Age: 75 Referring MD:  Date of Birth: [birth year] Gender: Female Account #: [id number] Procedure:                Colonoscopy Indications:              Surveillance: History of adenomatous polyps,                            inadequate prep on last exam (<26yr) ; > 10 TA                            *including one requiring EMR) July 2019. TA and SSP                            x 25 April 2019 with fair prep on that exam Medicines:                Monitored Anesthesia Care Procedure:                Pre-Anesthesia Assessment:                           - Prior to the procedure, a History and Physical                            was performed, and patient medications and                            allergies were reviewed. The patient's tolerance of                            previous anesthesia was also reviewed. The risks                            and benefits of the procedure and the sedation                            options and risks were discussed with the patient.                            All questions were answered, and informed consent                            was obtained. Prior Anticoagulants: The patient has                            taken no previous anticoagulant or antiplatelet                            agents. ASA Grade Assessment: III - A patient with                            severe systemic disease. After reviewing the risks  and benefits, the patient was deemed in                            satisfactory condition to undergo the procedure.                           After obtaining informed consent, the colonoscope                            was passed under direct vision. Throughout the                            procedure, the patient's blood pressure, pulse, and                            oxygen saturations were  monitored continuously. The                            Olympus CF-HQ190 [id number]) Colonoscope was                            introduced through the anus and advanced to the the                            cecum, identified by the ileocecal valve (AO                            obscured by retained material). The colonoscopy was                            performed with difficulty due to a redundant colon                            and significant looping. Successful completion of                            the procedure was aided by using manual pressure.                            The patient tolerated the procedure well. The                            quality of the bowel preparation was good except                            for some residual material in the cecum that could                            not be completely cleared. The ileocecal valve,                            cecal base and the rectum were photographed. Plenvu                            (  vomited evening dose) Scope In: 10:40:27 AM Scope Out: 11:03:49 AM Scope Withdrawal Time: 0 hours 12 minutes 2 seconds  Total Procedure Duration: 0 hours 23 minutes 22 seconds  Findings:                 The digital rectal exam findings include decreased                            sphincter tone.                           Three sessile polyps were found in the transverse                            colon and ascending colon. The polyps were 3 to 5                            mm in size. These polyps were removed with a cold                            snare. Resection and retrieval were complete.                           A 5-6 mm polyp was found in the descending colon.                            The polyp was sessile. The polyp was removed with a                            cold snare. Resection and retrieval were complete.                           Many diverticula were found in the left colon and                            right colon.                            The colon (entire examined portion) was redundant.                           Retroflexion in the rectum was not performed.                            (inability to retain air due to decreased sphincter                            tone)                           The exam was otherwise without abnormality. Complications:            No immediate complications. Estimated Blood Loss:     Estimated blood loss was minimal. Impression:               -  Decreased sphincter tone found on digital rectal                            exam.                           - Three 3 to 5 mm polyps in the transverse colon                            and in the ascending colon, removed with a cold                            snare. Resected and retrieved.                           - One 5-6 mm polyp in the descending colon, removed                            with a cold snare. Resected and retrieved.                           - Diverticulosis in the left colon and in the right                            colon.                           - Redundant colon.                           - The examination was otherwise normal. Recommendation:           - Patient has a contact number available for                            emergencies. The signs and symptoms of potential                            delayed complications were discussed with the                            patient. Return to normal activities tomorrow.                            Written discharge instructions were provided to the                            patient.                           - Resume previous diet.                           - Continue present medications.                           -  Await pathology results.                           - No repeat surveillance colonoscopy recommended                            due to age and current guidelines. Henry L. Loletha Carrow, MD 01/04/2021 11:12:18 AM This report has been signed  electronically.

## 2021-01-04 NOTE — Progress Notes (Signed)
C.W. vital signs. 

## 2021-01-04 NOTE — Telephone Encounter (Signed)
Thanks for the note -HD

## 2021-01-04 NOTE — Progress Notes (Signed)
pt tolerated well. VSS. awake and to recovery. Report given to RN.  

## 2021-01-06 ENCOUNTER — Telehealth: Payer: Self-pay

## 2021-01-06 DIAGNOSIS — E1165 Type 2 diabetes mellitus with hyperglycemia: Secondary | ICD-10-CM | POA: Diagnosis not present

## 2021-01-06 DIAGNOSIS — I152 Hypertension secondary to endocrine disorders: Secondary | ICD-10-CM | POA: Diagnosis not present

## 2021-01-06 DIAGNOSIS — M1712 Unilateral primary osteoarthritis, left knee: Secondary | ICD-10-CM | POA: Diagnosis not present

## 2021-01-06 DIAGNOSIS — E1159 Type 2 diabetes mellitus with other circulatory complications: Secondary | ICD-10-CM | POA: Diagnosis not present

## 2021-01-06 DIAGNOSIS — E1122 Type 2 diabetes mellitus with diabetic chronic kidney disease: Secondary | ICD-10-CM | POA: Diagnosis not present

## 2021-01-06 DIAGNOSIS — M1711 Unilateral primary osteoarthritis, right knee: Secondary | ICD-10-CM | POA: Diagnosis not present

## 2021-01-06 DIAGNOSIS — E559 Vitamin D deficiency, unspecified: Secondary | ICD-10-CM | POA: Diagnosis not present

## 2021-01-06 DIAGNOSIS — E782 Mixed hyperlipidemia: Secondary | ICD-10-CM | POA: Diagnosis not present

## 2021-01-06 DIAGNOSIS — M170 Bilateral primary osteoarthritis of knee: Secondary | ICD-10-CM | POA: Diagnosis not present

## 2021-01-06 NOTE — Telephone Encounter (Signed)
Left message on answering machine. 

## 2021-01-10 ENCOUNTER — Encounter: Payer: Self-pay | Admitting: Podiatry

## 2021-01-10 ENCOUNTER — Other Ambulatory Visit: Payer: Self-pay

## 2021-01-10 ENCOUNTER — Ambulatory Visit (INDEPENDENT_AMBULATORY_CARE_PROVIDER_SITE_OTHER): Payer: Medicare Other | Admitting: Podiatry

## 2021-01-10 DIAGNOSIS — M2041 Other hammer toe(s) (acquired), right foot: Secondary | ICD-10-CM

## 2021-01-10 DIAGNOSIS — M79674 Pain in right toe(s): Secondary | ICD-10-CM

## 2021-01-10 DIAGNOSIS — B351 Tinea unguium: Secondary | ICD-10-CM

## 2021-01-10 DIAGNOSIS — L84 Corns and callosities: Secondary | ICD-10-CM

## 2021-01-10 DIAGNOSIS — M79675 Pain in left toe(s): Secondary | ICD-10-CM

## 2021-01-11 ENCOUNTER — Encounter: Payer: Self-pay | Admitting: Gastroenterology

## 2021-01-13 DIAGNOSIS — M170 Bilateral primary osteoarthritis of knee: Secondary | ICD-10-CM | POA: Diagnosis not present

## 2021-01-14 NOTE — Progress Notes (Signed)
Subjective:   Patient ID: Heather May, female   DOB: 75 y.o.   MRN: [id number]   HPI Patient she develops thickness of her nailbeds mostly on the right foot 1-5 that become tender and hard to cut and she is wondering about fungus and also has lesions which she gets underneath her feet which can be painful and has digital deformities with elevation of the lesser toes of both feet.  Patient does not smoke likes to be active   Review of Systems  All other systems reviewed and are negative.       Objective:  Physical Exam Vitals and nursing note reviewed.  Constitutional:      Appearance: She is well-developed.  Pulmonary:     Effort: Pulmonary effort is normal.  Musculoskeletal:        General: Normal range of motion.  Skin:    General: Skin is warm.  Neurological:     Mental Status: She is alert.     Neurovascular status was found to be intact muscle strength adequate of the flexor extensor tendon groups with moderate digital deformity with hammertoe deformity and deviation of the lesser digits.  Patient has on the right nailbeds thickness of the beds with incurvation and moderate discomfort associated with them with concerns about the color and the thickness.  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  Today I went ahead and I debrided the nailbeds on the right with no iatrogenic bleeding I debrided lesions bilateral and I discussed hammertoe repair and considerations for treatment.  At this point we will hold off and just try to treat conservatively and she is advised on wider shoe gear

## 2021-01-18 ENCOUNTER — Ambulatory Visit (INDEPENDENT_AMBULATORY_CARE_PROVIDER_SITE_OTHER): Payer: Medicare Other

## 2021-01-18 DIAGNOSIS — I4821 Permanent atrial fibrillation: Secondary | ICD-10-CM | POA: Diagnosis not present

## 2021-01-18 LAB — CUP PACEART REMOTE DEVICE CHECK
Battery Remaining Longevity: 30 mo
Battery Remaining Percentage: 52 %
Brady Statistic RA Percent Paced: 0 %
Brady Statistic RV Percent Paced: 73 %
Date Time Interrogation Session: 20220329044100
Implantable Pulse Generator Implant Date: 20160513
Lead Channel Impedance Value: 361 Ohm
Lead Channel Impedance Value: 611 Ohm
Lead Channel Pacing Threshold Amplitude: 1.2 V
Lead Channel Pacing Threshold Pulse Width: 0.4 ms
Lead Channel Setting Pacing Amplitude: 1.6 V
Lead Channel Setting Pacing Pulse Width: 0.4 ms
Lead Channel Setting Sensing Sensitivity: 0.6 mV
Pulse Gen Serial Number: 732886

## 2021-01-26 IMAGING — MG DIGITAL SCREENING BILAT W/ TOMO W/ CAD
6 of 10 series · 6 of 30 positions shown · non-contrast
Comparison: Previous exam(s).

CLINICAL DATA: Screening.

EXAM:
DIGITAL SCREENING BILATERAL MAMMOGRAM WITH TOMO AND CAD

[R MLO synth-2D]
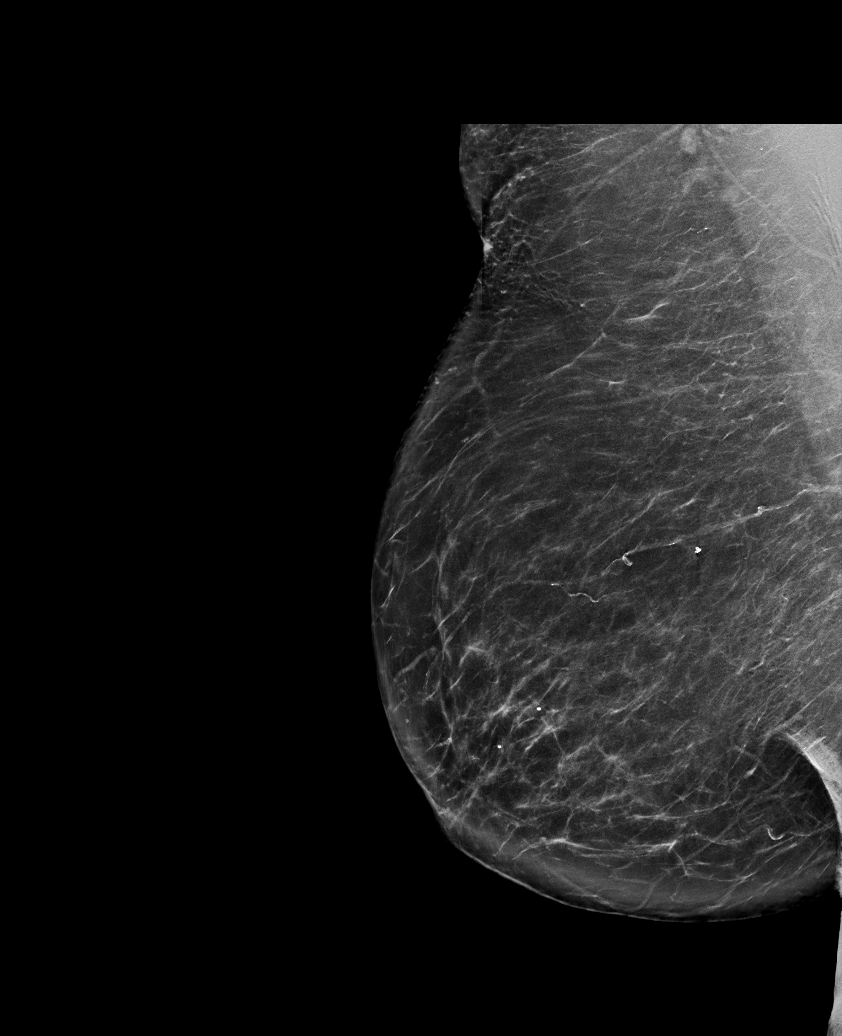

[L MLO synth-2D (1 of 2)]
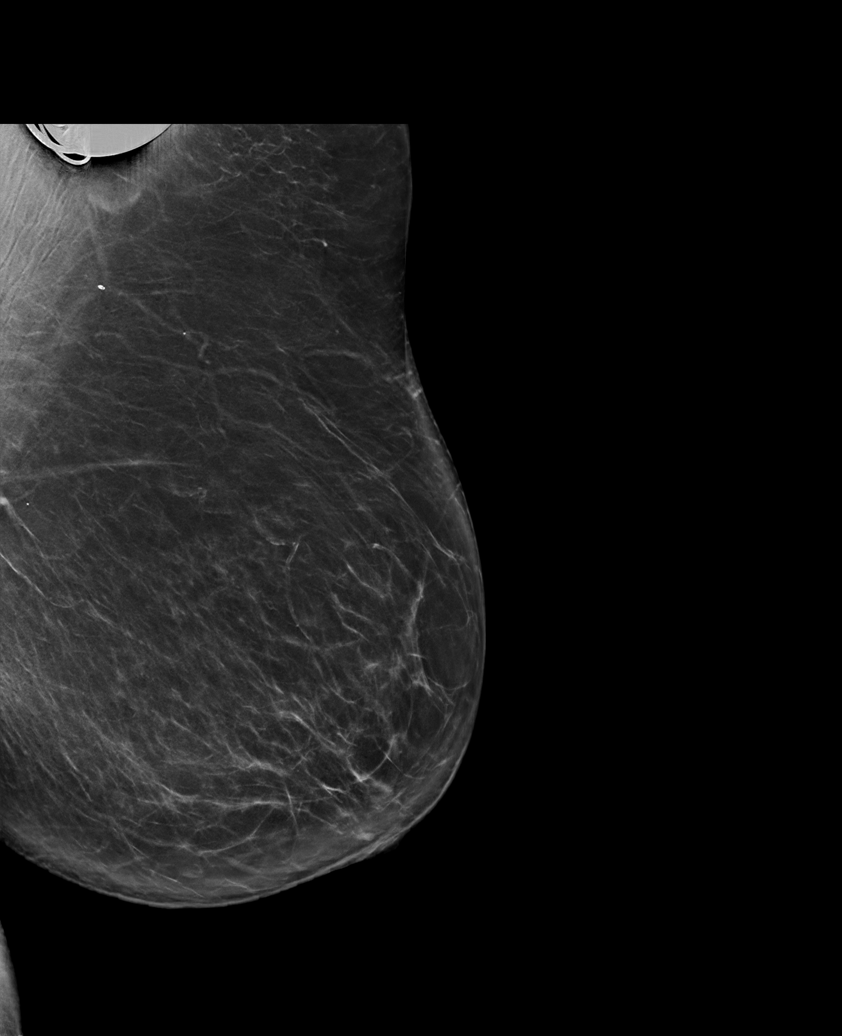

[L CC synth-2D]
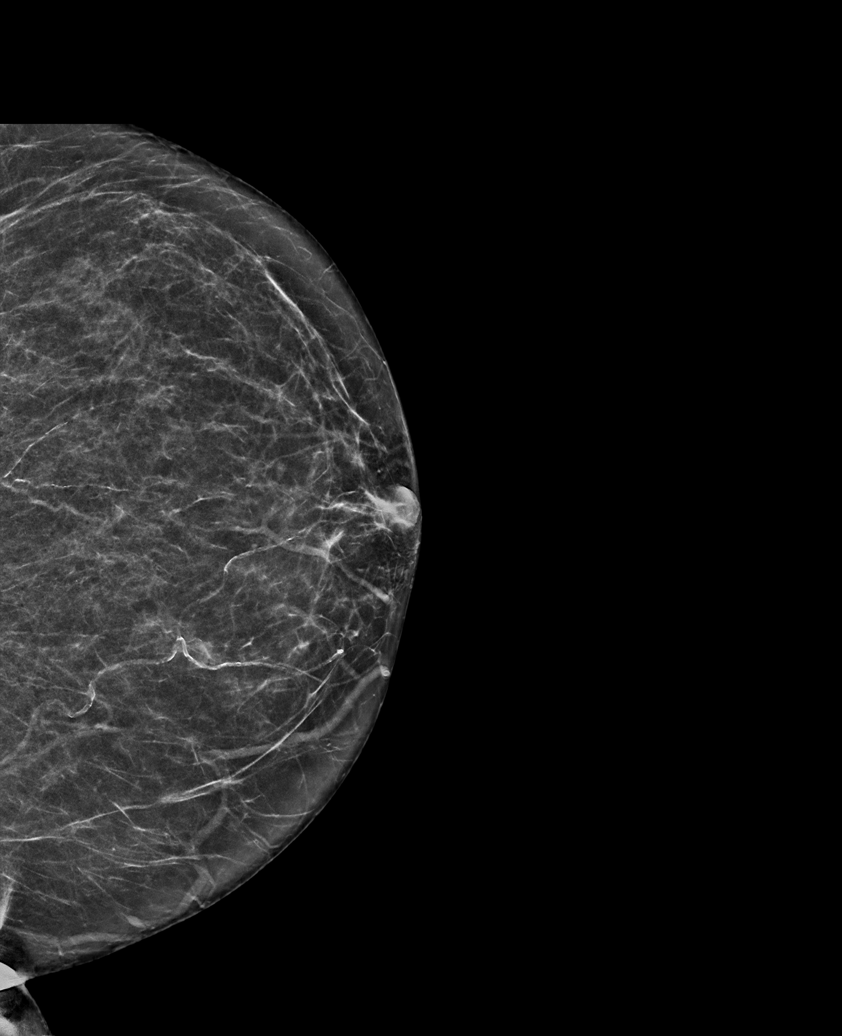

[L MLO synth-2D (2 of 2)]
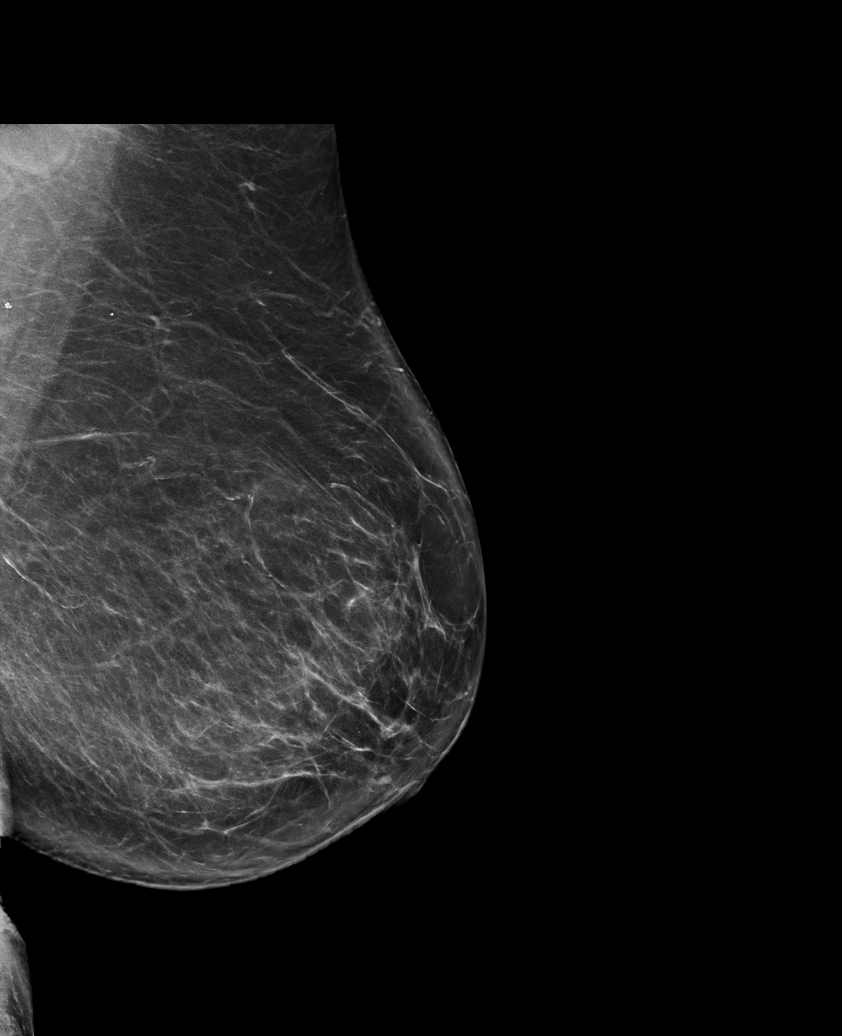

[R CC synth-2D]
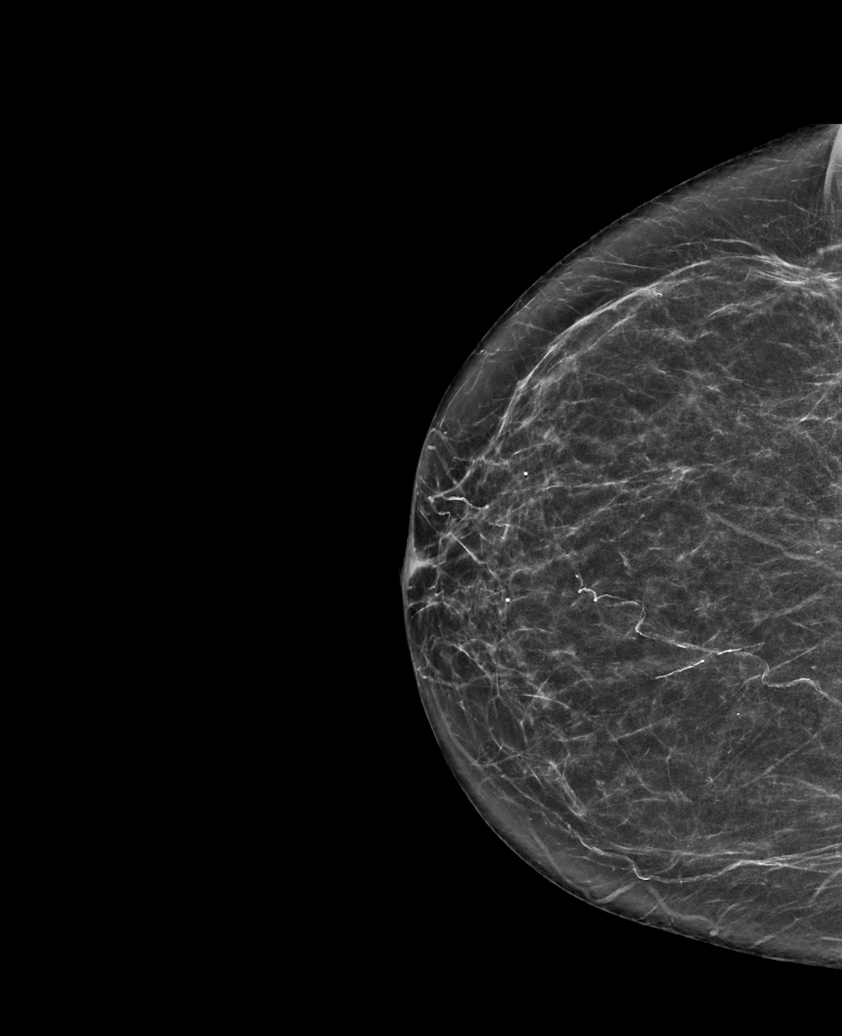

[L MLO tomo · tomo slice 45/88.0]
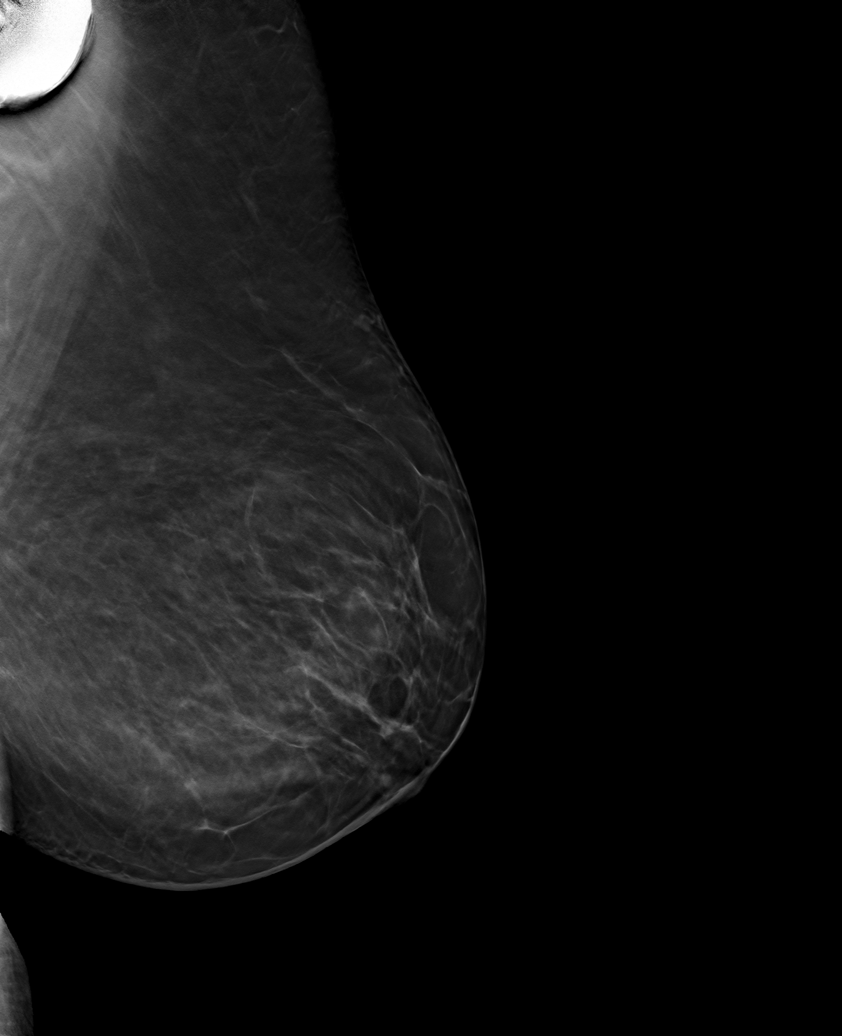

[6 of 30 positions shown; findings below may reference images not displayed]

ACR Breast Density Category b: There are scattered areas of
fibroglandular density.
FINDINGS: There are no findings suspicious for malignancy. Images were
processed with CAD.
IMPRESSION: No mammographic evidence of malignancy. A result letter of this
screening mammogram will be mailed directly to the patient.

RECOMMENDATION:
Screening mammogram in one year. (Code:[id number])

BI-RADS CATEGORY  1: Negative.

## 2021-01-27 DIAGNOSIS — M1712 Unilateral primary osteoarthritis, left knee: Secondary | ICD-10-CM | POA: Diagnosis not present

## 2021-01-27 DIAGNOSIS — L738 Other specified follicular disorders: Secondary | ICD-10-CM | POA: Diagnosis not present

## 2021-01-27 DIAGNOSIS — M170 Bilateral primary osteoarthritis of knee: Secondary | ICD-10-CM | POA: Diagnosis not present

## 2021-01-27 DIAGNOSIS — M1711 Unilateral primary osteoarthritis, right knee: Secondary | ICD-10-CM | POA: Diagnosis not present

## 2021-02-01 NOTE — Progress Notes (Signed)
Remote pacemaker transmission.   

## 2021-02-07 ENCOUNTER — Ambulatory Visit: Payer: Medicare Other | Admitting: Family Medicine

## 2021-02-07 DIAGNOSIS — R059 Cough, unspecified: Secondary | ICD-10-CM | POA: Diagnosis not present

## 2021-02-07 DIAGNOSIS — U071 COVID-19: Secondary | ICD-10-CM | POA: Diagnosis not present

## 2021-02-14 ENCOUNTER — Ambulatory Visit: Payer: Medicare Other | Admitting: Family Medicine

## 2021-02-15 DIAGNOSIS — Z20822 Contact with and (suspected) exposure to covid-19: Secondary | ICD-10-CM | POA: Diagnosis not present

## 2021-02-17 ENCOUNTER — Telehealth (HOSPITAL_COMMUNITY): Payer: Self-pay | Admitting: Cardiology

## 2021-02-17 ENCOUNTER — Ambulatory Visit (HOSPITAL_COMMUNITY): Payer: Medicare Other | Attending: Cardiology

## 2021-02-17 NOTE — Telephone Encounter (Signed)
patient was not a NO SHOW for her echocardiogram on 02/17/21 called and LVM @ 6:54am on 02/17/21 that she was sick stil with COVID and unable to keep appointment. I called patient to reschedule her @ 10:14 and left a voicemail to call office to reschedule.

## 2021-02-22 DIAGNOSIS — U071 COVID-19: Secondary | ICD-10-CM | POA: Diagnosis not present

## 2021-02-22 DIAGNOSIS — E119 Type 2 diabetes mellitus without complications: Secondary | ICD-10-CM | POA: Diagnosis not present

## 2021-02-22 DIAGNOSIS — I10 Essential (primary) hypertension: Secondary | ICD-10-CM | POA: Diagnosis not present

## 2021-03-02 DIAGNOSIS — L723 Sebaceous cyst: Secondary | ICD-10-CM | POA: Diagnosis not present

## 2021-03-09 DIAGNOSIS — Z23 Encounter for immunization: Secondary | ICD-10-CM | POA: Diagnosis not present

## 2021-03-16 DIAGNOSIS — L723 Sebaceous cyst: Secondary | ICD-10-CM | POA: Diagnosis not present

## 2021-03-23 DIAGNOSIS — E1165 Type 2 diabetes mellitus with hyperglycemia: Secondary | ICD-10-CM | POA: Diagnosis not present

## 2021-03-23 DIAGNOSIS — E782 Mixed hyperlipidemia: Secondary | ICD-10-CM | POA: Diagnosis not present

## 2021-03-23 DIAGNOSIS — E1169 Type 2 diabetes mellitus with other specified complication: Secondary | ICD-10-CM | POA: Diagnosis not present

## 2021-03-23 DIAGNOSIS — I152 Hypertension secondary to endocrine disorders: Secondary | ICD-10-CM | POA: Diagnosis not present

## 2021-03-23 DIAGNOSIS — I10 Essential (primary) hypertension: Secondary | ICD-10-CM | POA: Diagnosis not present

## 2021-04-19 ENCOUNTER — Ambulatory Visit (INDEPENDENT_AMBULATORY_CARE_PROVIDER_SITE_OTHER): Payer: Medicare Other

## 2021-04-19 DIAGNOSIS — I4821 Permanent atrial fibrillation: Secondary | ICD-10-CM | POA: Diagnosis not present

## 2021-04-19 LAB — CUP PACEART REMOTE DEVICE CHECK
Battery Remaining Longevity: 30 mo
Battery Remaining Percentage: 46 %
Brady Statistic RA Percent Paced: 0 %
Brady Statistic RV Percent Paced: 72 %
Date Time Interrogation Session: 20220628044200
Implantable Pulse Generator Implant Date: 20160513
Lead Channel Impedance Value: 365 Ohm
Lead Channel Impedance Value: 623 Ohm
Lead Channel Pacing Threshold Amplitude: 1.3 V
Lead Channel Pacing Threshold Pulse Width: 0.4 ms
Lead Channel Setting Pacing Amplitude: 3.5 V
Lead Channel Setting Pacing Pulse Width: 0.4 ms
Lead Channel Setting Sensing Sensitivity: 0.6 mV
Pulse Gen Serial Number: 732886

## 2021-04-20 ENCOUNTER — Encounter: Payer: Self-pay | Admitting: Family Medicine

## 2021-04-20 ENCOUNTER — Ambulatory Visit (INDEPENDENT_AMBULATORY_CARE_PROVIDER_SITE_OTHER): Payer: Medicare Other | Admitting: Family Medicine

## 2021-04-20 VITALS — BP 121/66 | HR 62 | Ht 66.0 in | Wt 232.0 lb

## 2021-04-20 DIAGNOSIS — Z7189 Other specified counseling: Secondary | ICD-10-CM

## 2021-04-20 DIAGNOSIS — G4731 Primary central sleep apnea: Secondary | ICD-10-CM

## 2021-04-20 NOTE — Progress Notes (Signed)
CM sent to [facility] 

## 2021-04-20 NOTE — Patient Instructions (Signed)
Please continue using your ASV regularly. While your insurance requires that you use ASV at least 4 hours each night on 70% of the nights, I recommend, that you not skip any nights and use it throughout the night if you can. Getting used to ASV and staying with the treatment long term does take time and patience and discipline. Untreated obstructive sleep apnea when it is moderate to severe can have an adverse impact on cardiovascular health and raise her risk for heart disease, arrhythmias, hypertension, congestive heart failure, stroke and diabetes. Untreated obstructive sleep apnea causes sleep disruption, nonrestorative sleep, and sleep deprivation. This can have an impact on your day to day functioning and cause daytime sleepiness and impairment of cognitive function, memory loss, mood disturbance, and problems focussing. Using ASV regularly can improve these symptoms.   Follow up in 1 year  

## 2021-04-20 NOTE — Progress Notes (Signed)
PATIENT: Heather May DOB: Nov 05, 1945  REASON FOR VISIT: follow up HISTORY FROM: patient  Chief Complaint  Patient presents with   Obstructive Sleep Apnea    Rm 1, alone. Here for bipap and sx f/u, pt reports doing well on bipap therapy. Reports no sz like activity.       HISTORY OF PRESENT ILLNESS: 04/20/2021 ALL: Heather May returns for follow up for complex sleep apnea on ASV therapy. She continues to very well. She is using ASV nightly. She sleeps well. She denies seizure activity. She had Covid last month. She recovered well. She was fully vaccinated and booster. She is no longer taking Plavix. She has continued asa 81mg . Head tremor is stable. She continues to use Rolator.     02/05/2020 ALL:  Heather May is a 75 y.o. 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 She reports one episode where she felt as if she were smothering but this has not occurred recently. She is doing well with her machine and has all needed supplies. She does admit to not using ASV 1 night every week as she helps to care for her grandchildren at her daughter's home. She does note benefit of using ASV therapy.  Compliance report dated 11/06/2019 through 02/03/2020 reveals that she used ASV 80 of the past 90 days for compliance of 89%. She used ASV therapy greater than 4 hours 73 of the past 90 days for compliance of 81%. Average usage was 6 hours and 26 minutes. Residual AHI was 1.6 with minimum EPAP of 5 cm of water, minimum pressure support of 3 cm of water and maximum pressure support of 15 cm of water. There was no significant leak noted.  She discontinued levetiracetam last year. No seizure activity. She continues Plavix 75mg  daily in setting of previous CVA, CAD s/p cardiac stents and atril fib. She is followed closely by PCP for stroke prevention and management of co morbidities. She is also followed regularly by cardiology. She requested to stop Plavix at her last follow-up but was  instructed to continue due to cardiac stents. She is exercising regularly. She participates with [employer] at her local [employer] 3 days a week. She stays well-hydrated. She does note chronic pain but feels that she is well managed. She uses her Rollator for long distance walking.  HISTORY: (copied from my note on 12/20/2018)  Heather May is a 75 y.o. 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  Compliance report dated 11/16/2018 through 12/15/2018 reveals that she is using BiPAP 29 out of 30 days for compliance 97%.  She is using her BiPAP for greater than 4 hours 23 out of 30 days for compliance of 77%.  Average usage is 5 hours and 32 minutes.  AHI was 4.7.  There is no significant leak.  She reports that she has done well since mask refitting.  She does note benefit from using BiPAP as she is less sleepy during the day.  She admits that she often goes to bed late and does not use the machine for greater than 4 hours.   She has been seen in the past for [facility] in 2014, seizure post SAH and lucunar infarct in 2019.  It was noted that Keppra should be discontinued however she has continued to take this medication.  She has not had any seizure activity and only one with SAH.[name] She was also diagnosed with Parkinson's around 2015 but was later  told that she had an essential tremor.  She reports that she requested a second opinion after participating in a Parkinson's support group.  She did not feel that she had any symptoms related to Parkinson's with the exception of a head tremor.  Head tremor is worse when she is nervous or tired.  She has not noticed any changes with her gait or ability to move.  No concerns of memory loss this time.  REVIEW OF SYSTEMS: Out of a complete 14 system review of symptoms, the patient complains only of the following symptoms, chronic pain, fatigue and all other reviewed systems are negative.  ESS: 6  ALLERGIES: Allergies  Allergen Reactions   Hydrocodone  Nausea And Vomiting   Sulfa Antibiotics     RASH    HOME MEDICATIONS: Outpatient Medications Prior to Visit  Medication Sig Dispense Refill   aspirin EC 81 MG tablet Take 1 tablet (81 mg total) by mouth daily. Swallow whole. 30 tablet 11   benazepril (LOTENSIN) 20 MG tablet Take 20 mg by mouth daily.     cetirizine (ZYRTEC) 10 MG tablet Take 1 tablet by mouth daily.     Cholecalciferol (VITAMIN D3) 75 MCG (3000 UT) TABS Take 1 tablet by mouth daily.     diltiazem (CARDIZEM) 30 MG tablet TAKE 1 TABLET(30 MG) BY MOUTH DAILY AS NEEDED 30 tablet 8   hydrochlorothiazide (HYDRODIURIL) 25 MG tablet Take 25 mg by mouth daily.      LEVEMIR FLEXTOUCH 100 UNIT/ML Pen      lovastatin (MEVACOR) 40 MG tablet Take 40 mg by mouth at bedtime.     Multiple Vitamin (MULTIVITAMIN) tablet Take 1 tablet by mouth daily.     nitroGLYCERIN (NITROSTAT) 0.4 MG SL tablet Place 1 tablet (0.4 mg total) under the tongue every 5 (five) minutes as needed for chest pain. 25 tablet 1   ondansetron (ZOFRAN) 4 MG tablet Take 4 mg by mouth every 8 (eight) hours as needed for nausea or vomiting.     Probiotic Product (PROBIOTIC PO) Take 1 capsule by mouth daily.     TRULICITY 4.19 FX/9.0WI SOPN SMARTSIG:0.5 Milliliter(s) SUB-Q Once a Week     No facility-administered medications prior to visit.    PAST MEDICAL HISTORY: Past Medical History:  Diagnosis Date   Arthritis    joints   Blood transfusion without reported diagnosis    "when oldest daughter was born"   CAD (coronary artery disease) 08/15/13   Diabetes mellitus without complication (HCC)    Diverticulosis    Essential tremor    GERD (gastroesophageal reflux disease)    History of long-term use of multiple prescription drugs    Hyperlipidemia    mixed   Hypertension    Long-term (current) use of anticoagulants    PAD (peripheral artery disease) (HCC)    Percutaneous transluminal coronary angioplasty status    Permanent atrial fibrillation (HCC)     chads2vasc score is at least 5   Pulmonary embolism ([place])    Pulmonary fibrosis ([place])    Seizure ([place]) 2014   occured in setting of [place]   Sick sinus syndrome ([place])    Spinal stenosis    Stroke ([place])    Subarachnoid hemorrhage ([place])    Transient ischemic attack     PAST SURGICAL HISTORY: Past Surgical History:  Procedure Laterality Date   ABDOMINAL HYSTERECTOMY  2004   BASAL CELL CARCINOMA EXCISION     CARDIAC CATHETERIZATION  10/14   PCI    carpel  tunnel surgery Bilateral 2006/ 2010   [place]   x 1   COLONOSCOPY     HERNIA REPAIR  06/2010   LEFT ATRIAL APPENDAGE OCCLUSION N/A 08/26/2015   Procedure: LEFT ATRIAL APPENDAGE OCCLUSION;  Surgeon: Thompson Grayer, MD;  Location: [facility];  Service: Cardiovascular;  Laterality: N/A;   PACEMAKER INSERTION  03/05/15   [name] Accolade DR implanted in [place]   POLYPECTOMY     removal of big toe nail Bilateral    TEE WITHOUT CARDIOVERSION N/A 08/16/2015   Procedure: TRANSESOPHAGEAL ECHOCARDIOGRAM (TEE);  Surgeon: Pixie Casino, MD;  Location: [facility] ENDOSCOPY;  Service: Cardiovascular;  Laterality: N/A;   TEE WITHOUT CARDIOVERSION N/A 10/12/2015   Procedure: TRANSESOPHAGEAL ECHOCARDIOGRAM (TEE);  Surgeon: Pixie Casino, MD;  Location: [facility] ENDOSCOPY;  Service: Cardiovascular;  Laterality: N/A;   titanium pins inserted metatarsol bone  02/2012   2 pins placed     FAMILY HISTORY: Family History  Problem Relation Age of Onset   Stroke Mother    Epilepsy Mother    Stroke Father    Heart attack Father    Atrial fibrillation Sister    Atrial fibrillation Brother    Diabetes Mellitus II Brother    Pulmonary embolism Son    Colon cancer Neg Hx    Esophageal cancer Neg Hx    Stomach cancer Neg Hx    Rectal cancer Neg Hx    Colon polyps Neg Hx     SOCIAL HISTORY: Social History   Socioeconomic History   Marital status: Significant Other    Spouse name: Not on file   Number of children:  5   Years of education: 12   Highest education level: Not on file  Occupational History   Occupation: Retired  Tobacco Use   Smoking status: Former    Pack years: 0.00    Types: Cigarettes    Quit date: 07/06/2009    Years since quitting: 11.7   Smokeless tobacco: Never   Tobacco comments:    didnt smoke much  Vaping Use   Vaping Use: Never used  Substance and Sexual Activity   Alcohol use: No    Alcohol/week: 0.0 standard drinks    Comment: very infrequently   Drug use: No   Sexual activity: Not on file  Other Topics Concern   Not on file  Social History Narrative   Pt recently moved to [place]    Previously worked as a Teacher, music.   Caffeine use: only when eating out tea/soda   Right-handed   Lives at home with partner/roommate   Social Determinants of Health   Financial Resource Strain: Not on file  Food Insecurity: Not on file  Transportation Needs: Not on file  Physical Activity: Not on file  Stress: Not on file  Social Connections: Not on file  Intimate Partner Violence: Not on file      PHYSICAL EXAM  Vitals:   04/20/21 0846  BP: 121/66  Pulse: 62  Weight: 232 lb (105.2 kg)  Height: 5\' 6"  (1.676 m)    Body mass index is 37.45 kg/m.  Generalized: Well developed, in no acute distress  Cardiology: normal rate and rhythm, no murmur noted Respiratory: clear to auscultation bilaterally  Neurological examination  Mentation: Alert oriented to time, place, history taking. Follows all commands speech and language fluent Cranial nerve II-XII: Pupils were equal round reactive to light. Extraocular movements were full, visual field were  full on confrontational test. Facial sensation and strength were normal. Uvula tongue midline. Head turning and shoulder shrug  were normal and symmetric. Motor: The motor testing reveals 5 over 5 strength of bilateral upper extremities. 4+ of 5 of the left lower extremity, 4/5 of right lower  extremity. Good symmetric motor tone is noted throughout. Head tremor noted, worse when talking in exam room. Mild bradykinesia noted with finger and toe taps. No cogwheel rigidity.  Sensory: Sensory testing is intact to soft touch on all 4 extremities. No evidence of extinction is noted.  Coordination: Cerebellar testing reveals good finger-nose-finger and heel-to-shin bilaterally.  Gait and station: Uses both arms to push to a standing position. Gait is short but stable with Rolator. Tandem gait not assessed today.  Reflexes: Deep tendon reflexes are symmetric and normal bilaterally.   DIAGNOSTIC DATA (LABS, IMAGING, TESTING) - I reviewed patient records, labs, notes, testing and imaging myself where available.  No flowsheet data found.   Lab Results  Component Value Date   WBC 6.3 09/06/2020   HGB 12.6 09/06/2020   HCT 37.5 09/06/2020   MCV 100 (H) 09/06/2020   PLT 212 09/06/2020      Component Value Date/Time   NA 136 10/13/2015 1053   K 4.2 10/13/2015 1053   CL 100 10/13/2015 1053   CO2 25 10/13/2015 1053   GLUCOSE 303 (H) 10/13/2015 1053   BUN 15 10/13/2015 1053   CREATININE 0.74 10/13/2015 1053   CALCIUM 9.5 10/13/2015 1053   PROT 7.2 06/14/2016 1145   GFRNONAA >60 08/27/2015 0347   GFRAA >60 08/27/2015 0347   No results found for: CHOL, HDL, LDLCALC, LDLDIRECT, TRIG, CHOLHDL No results found for: HGBA1C Lab Results  Component Value Date   VITAMINB12 648 06/14/2016   Lab Results  Component Value Date   TSH 1.470 06/14/2016    ASSESSMENT AND PLAN 75 y.o. year old female  has a past medical history of Arthritis, Blood transfusion without reported diagnosis, CAD (coronary artery disease) (08/15/13), Diabetes mellitus without complication ([place]), Diverticulosis, Essential tremor, GERD (gastroesophageal reflux disease), History of long-term use of multiple prescription drugs, Hyperlipidemia, Hypertension, Long-term (current) use of anticoagulants, PAD (peripheral artery  disease) ([place]), Percutaneous transluminal coronary angioplasty status, Permanent atrial fibrillation ([place]), Pulmonary embolism ([place]), Pulmonary fibrosis ([place]), Seizure ([place]) (2014), Sick sinus syndrome ([place]), Spinal stenosis, Stroke ([place]), Subarachnoid hemorrhage ([place]), and Transient ischemic attack. here with     ICD-10-CM   1. Complex sleep apnea syndrome  G47.31     2. Encounter for counseling on adaptive servo-ventilation (ASV) use  Z71.89        Sinia is doing fairly well today. She continues ASV therapy without any significant difficulty. Compliance report shows excellent compliance. She will continue close follow-up with primary care and cardiology for management of co morbidities and stroke prevention. She will continue healthy lifestyle habits with well-balanced diet and regular exercise. She will follow-up with [place] in 1 year, sooner if needed. She verbalizes understanding and agreement with this plan.   No orders of the defined types were placed in this encounter.    No orders of the defined types were placed in this encounter.     Debbora Presto, FNP-C 04/20/2021, 8:50 AM [facility] [address], [place], [place] [address] [phone]

## 2021-05-03 ENCOUNTER — Ambulatory Visit: Payer: Medicare Other | Admitting: Physician Assistant

## 2021-05-10 DIAGNOSIS — Z20822 Contact with and (suspected) exposure to covid-19: Secondary | ICD-10-CM | POA: Diagnosis not present

## 2021-05-10 NOTE — Progress Notes (Signed)
Remote pacemaker transmission.   

## 2021-06-21 DIAGNOSIS — B070 Plantar wart: Secondary | ICD-10-CM | POA: Diagnosis not present

## 2021-06-21 DIAGNOSIS — E1165 Type 2 diabetes mellitus with hyperglycemia: Secondary | ICD-10-CM | POA: Diagnosis not present

## 2021-06-21 DIAGNOSIS — R739 Hyperglycemia, unspecified: Secondary | ICD-10-CM | POA: Diagnosis not present

## 2021-06-21 DIAGNOSIS — I10 Essential (primary) hypertension: Secondary | ICD-10-CM | POA: Diagnosis not present

## 2021-06-21 DIAGNOSIS — L400 Psoriasis vulgaris: Secondary | ICD-10-CM | POA: Diagnosis not present

## 2021-06-21 DIAGNOSIS — Z23 Encounter for immunization: Secondary | ICD-10-CM | POA: Diagnosis not present

## 2021-06-21 DIAGNOSIS — E782 Mixed hyperlipidemia: Secondary | ICD-10-CM | POA: Diagnosis not present

## 2021-07-19 ENCOUNTER — Ambulatory Visit (INDEPENDENT_AMBULATORY_CARE_PROVIDER_SITE_OTHER): Payer: Medicare Other

## 2021-07-19 DIAGNOSIS — I4819 Other persistent atrial fibrillation: Secondary | ICD-10-CM | POA: Diagnosis not present

## 2021-07-19 LAB — CUP PACEART REMOTE DEVICE CHECK
Battery Remaining Longevity: 24 mo
Battery Remaining Percentage: 38 %
Brady Statistic RA Percent Paced: 0 %
Brady Statistic RV Percent Paced: 72 %
Date Time Interrogation Session: 20220927044100
Implantable Pulse Generator Implant Date: 20160513
Lead Channel Impedance Value: 350 Ohm
Lead Channel Impedance Value: 612 Ohm
Lead Channel Pacing Threshold Amplitude: 1.1 V
Lead Channel Pacing Threshold Pulse Width: 0.4 ms
Lead Channel Setting Pacing Amplitude: 1.4 V
Lead Channel Setting Pacing Pulse Width: 0.4 ms
Lead Channel Setting Sensing Sensitivity: 0.6 mV
Pulse Gen Serial Number: 732886

## 2021-07-27 NOTE — Progress Notes (Signed)
Remote pacemaker transmission.   

## 2021-08-25 DIAGNOSIS — Z1339 Encounter for screening examination for other mental health and behavioral disorders: Secondary | ICD-10-CM | POA: Diagnosis not present

## 2021-08-25 DIAGNOSIS — Z0000 Encounter for general adult medical examination without abnormal findings: Secondary | ICD-10-CM | POA: Diagnosis not present

## 2021-08-25 DIAGNOSIS — Z1331 Encounter for screening for depression: Secondary | ICD-10-CM | POA: Diagnosis not present

## 2021-08-30 DIAGNOSIS — Z23 Encounter for immunization: Secondary | ICD-10-CM | POA: Diagnosis not present

## 2021-09-01 DIAGNOSIS — E119 Type 2 diabetes mellitus without complications: Secondary | ICD-10-CM | POA: Diagnosis not present

## 2021-09-01 DIAGNOSIS — H524 Presbyopia: Secondary | ICD-10-CM | POA: Diagnosis not present

## 2021-09-20 DIAGNOSIS — E559 Vitamin D deficiency, unspecified: Secondary | ICD-10-CM | POA: Diagnosis not present

## 2021-09-20 DIAGNOSIS — I10 Essential (primary) hypertension: Secondary | ICD-10-CM | POA: Diagnosis not present

## 2021-09-20 DIAGNOSIS — E782 Mixed hyperlipidemia: Secondary | ICD-10-CM | POA: Diagnosis not present

## 2021-09-20 DIAGNOSIS — E1165 Type 2 diabetes mellitus with hyperglycemia: Secondary | ICD-10-CM | POA: Diagnosis not present

## 2021-10-05 DIAGNOSIS — Z23 Encounter for immunization: Secondary | ICD-10-CM | POA: Diagnosis not present

## 2021-10-18 ENCOUNTER — Ambulatory Visit (INDEPENDENT_AMBULATORY_CARE_PROVIDER_SITE_OTHER): Payer: Medicare Other

## 2021-10-18 DIAGNOSIS — I219 Acute myocardial infarction, unspecified: Secondary | ICD-10-CM | POA: Diagnosis not present

## 2021-10-18 LAB — CUP PACEART REMOTE DEVICE CHECK
Battery Remaining Longevity: 18 mo
Battery Remaining Percentage: 32 %
Brady Statistic RA Percent Paced: 0 %
Brady Statistic RV Percent Paced: 72 %
Date Time Interrogation Session: 20221227044000
Implantable Pulse Generator Implant Date: 20160513
Lead Channel Impedance Value: 346 Ohm
Lead Channel Impedance Value: 599 Ohm
Lead Channel Pacing Threshold Amplitude: 1.4 V
Lead Channel Pacing Threshold Pulse Width: 0.4 ms
Lead Channel Setting Pacing Amplitude: 1.5 V
Lead Channel Setting Pacing Pulse Width: 0.4 ms
Lead Channel Setting Sensing Sensitivity: 0.6 mV
Pulse Gen Serial Number: 732886

## 2021-10-27 NOTE — Progress Notes (Signed)
Remote pacemaker transmission.   

## 2021-11-02 DIAGNOSIS — L723 Sebaceous cyst: Secondary | ICD-10-CM | POA: Diagnosis not present

## 2021-11-02 DIAGNOSIS — E1165 Type 2 diabetes mellitus with hyperglycemia: Secondary | ICD-10-CM | POA: Diagnosis not present

## 2021-11-02 DIAGNOSIS — B351 Tinea unguium: Secondary | ICD-10-CM | POA: Diagnosis not present

## 2021-11-02 DIAGNOSIS — L400 Psoriasis vulgaris: Secondary | ICD-10-CM | POA: Diagnosis not present

## 2021-11-14 DIAGNOSIS — E1165 Type 2 diabetes mellitus with hyperglycemia: Secondary | ICD-10-CM | POA: Diagnosis not present

## 2021-11-14 DIAGNOSIS — I10 Essential (primary) hypertension: Secondary | ICD-10-CM | POA: Diagnosis not present

## 2021-11-14 DIAGNOSIS — E559 Vitamin D deficiency, unspecified: Secondary | ICD-10-CM | POA: Diagnosis not present

## 2021-11-14 DIAGNOSIS — E782 Mixed hyperlipidemia: Secondary | ICD-10-CM | POA: Diagnosis not present

## 2021-11-17 ENCOUNTER — Other Ambulatory Visit: Payer: Self-pay

## 2021-11-17 ENCOUNTER — Ambulatory Visit (INDEPENDENT_AMBULATORY_CARE_PROVIDER_SITE_OTHER): Payer: Medicare Other | Admitting: Podiatry

## 2021-11-17 DIAGNOSIS — M79674 Pain in right toe(s): Secondary | ICD-10-CM | POA: Diagnosis not present

## 2021-11-17 DIAGNOSIS — M79675 Pain in left toe(s): Secondary | ICD-10-CM

## 2021-11-17 DIAGNOSIS — B351 Tinea unguium: Secondary | ICD-10-CM

## 2021-11-20 NOTE — Progress Notes (Signed)
Subjective:   Patient ID: Heather May, female   DOB: 76 y.o.   MRN: [id number]   HPI Patient presents with thickened toenails 1-5 both feet that get painful for her and she cannot take care of.  States that trimming is effective and gives her periods of time or relief    ROS      Objective:  Physical Exam  Neurovascular status intact with patient found to have thick yellow brittle nailbeds 1-5 both feet that are painful when pressed and make walking difficult     Assessment:  Mycotic nail infection 1-5 both feet with pain     Plan:  Debridement nailbeds 1-5 both feet no iatrogenic bleeding noted advised on continued routine care

## 2021-12-26 DIAGNOSIS — E559 Vitamin D deficiency, unspecified: Secondary | ICD-10-CM | POA: Diagnosis not present

## 2021-12-26 DIAGNOSIS — E1165 Type 2 diabetes mellitus with hyperglycemia: Secondary | ICD-10-CM | POA: Diagnosis not present

## 2021-12-26 DIAGNOSIS — I10 Essential (primary) hypertension: Secondary | ICD-10-CM | POA: Diagnosis not present

## 2022-01-11 DIAGNOSIS — E1165 Type 2 diabetes mellitus with hyperglycemia: Secondary | ICD-10-CM | POA: Diagnosis not present

## 2022-01-11 DIAGNOSIS — I10 Essential (primary) hypertension: Secondary | ICD-10-CM | POA: Diagnosis not present

## 2022-01-17 ENCOUNTER — Ambulatory Visit (INDEPENDENT_AMBULATORY_CARE_PROVIDER_SITE_OTHER): Payer: Medicare Other

## 2022-01-17 DIAGNOSIS — G459 Transient cerebral ischemic attack, unspecified: Secondary | ICD-10-CM

## 2022-01-17 LAB — CUP PACEART REMOTE DEVICE CHECK
Battery Remaining Longevity: 18 mo
Battery Remaining Percentage: 27 %
Brady Statistic RA Percent Paced: 0 %
Brady Statistic RV Percent Paced: 73 %
Date Time Interrogation Session: 20230328044100
Implantable Pulse Generator Implant Date: 20160513
Lead Channel Impedance Value: 361 Ohm
Lead Channel Impedance Value: 628 Ohm
Lead Channel Pacing Threshold Amplitude: 1.3 V
Lead Channel Pacing Threshold Pulse Width: 0.4 ms
Lead Channel Setting Pacing Amplitude: 1.5 V
Lead Channel Setting Pacing Pulse Width: 0.4 ms
Lead Channel Setting Sensing Sensitivity: 0.6 mV
Pulse Gen Serial Number: 732886

## 2022-01-30 NOTE — Progress Notes (Signed)
Remote pacemaker transmission.   

## 2022-02-08 DIAGNOSIS — E1165 Type 2 diabetes mellitus with hyperglycemia: Secondary | ICD-10-CM | POA: Diagnosis not present

## 2022-02-08 DIAGNOSIS — E559 Vitamin D deficiency, unspecified: Secondary | ICD-10-CM | POA: Diagnosis not present

## 2022-02-08 DIAGNOSIS — I10 Essential (primary) hypertension: Secondary | ICD-10-CM | POA: Diagnosis not present

## 2022-02-09 DIAGNOSIS — Z20822 Contact with and (suspected) exposure to covid-19: Secondary | ICD-10-CM | POA: Diagnosis not present

## 2022-02-10 ENCOUNTER — Ambulatory Visit: Payer: Medicare Other | Admitting: Gastroenterology

## 2022-02-13 DIAGNOSIS — E1165 Type 2 diabetes mellitus with hyperglycemia: Secondary | ICD-10-CM | POA: Diagnosis not present

## 2022-02-15 DIAGNOSIS — I152 Hypertension secondary to endocrine disorders: Secondary | ICD-10-CM | POA: Diagnosis not present

## 2022-02-15 DIAGNOSIS — E1165 Type 2 diabetes mellitus with hyperglycemia: Secondary | ICD-10-CM | POA: Diagnosis not present

## 2022-02-15 DIAGNOSIS — E1159 Type 2 diabetes mellitus with other circulatory complications: Secondary | ICD-10-CM | POA: Diagnosis not present

## 2022-02-15 DIAGNOSIS — E1121 Type 2 diabetes mellitus with diabetic nephropathy: Secondary | ICD-10-CM | POA: Diagnosis not present

## 2022-02-17 DIAGNOSIS — Z20822 Contact with and (suspected) exposure to covid-19: Secondary | ICD-10-CM | POA: Diagnosis not present

## 2022-02-22 DIAGNOSIS — E1165 Type 2 diabetes mellitus with hyperglycemia: Secondary | ICD-10-CM | POA: Diagnosis not present

## 2022-02-22 DIAGNOSIS — E119 Type 2 diabetes mellitus without complications: Secondary | ICD-10-CM | POA: Diagnosis not present

## 2022-02-23 DIAGNOSIS — Z20822 Contact with and (suspected) exposure to covid-19: Secondary | ICD-10-CM | POA: Diagnosis not present

## 2022-02-25 DIAGNOSIS — Z20822 Contact with and (suspected) exposure to covid-19: Secondary | ICD-10-CM | POA: Diagnosis not present

## 2022-02-27 DIAGNOSIS — Z6841 Body Mass Index (BMI) 40.0 and over, adult: Secondary | ICD-10-CM | POA: Diagnosis not present

## 2022-02-27 DIAGNOSIS — E1165 Type 2 diabetes mellitus with hyperglycemia: Secondary | ICD-10-CM | POA: Diagnosis not present

## 2022-02-27 DIAGNOSIS — E11319 Type 2 diabetes mellitus with unspecified diabetic retinopathy without macular edema: Secondary | ICD-10-CM | POA: Diagnosis not present

## 2022-04-07 DIAGNOSIS — E1165 Type 2 diabetes mellitus with hyperglycemia: Secondary | ICD-10-CM | POA: Diagnosis not present

## 2022-04-07 DIAGNOSIS — I10 Essential (primary) hypertension: Secondary | ICD-10-CM | POA: Diagnosis not present

## 2022-04-12 DIAGNOSIS — M19042 Primary osteoarthritis, left hand: Secondary | ICD-10-CM | POA: Diagnosis not present

## 2022-04-12 DIAGNOSIS — E663 Overweight: Secondary | ICD-10-CM | POA: Diagnosis not present

## 2022-04-12 DIAGNOSIS — E1165 Type 2 diabetes mellitus with hyperglycemia: Secondary | ICD-10-CM | POA: Diagnosis not present

## 2022-04-12 DIAGNOSIS — I10 Essential (primary) hypertension: Secondary | ICD-10-CM | POA: Diagnosis not present

## 2022-04-18 ENCOUNTER — Ambulatory Visit (INDEPENDENT_AMBULATORY_CARE_PROVIDER_SITE_OTHER): Payer: Medicare Other

## 2022-04-18 DIAGNOSIS — I219 Acute myocardial infarction, unspecified: Secondary | ICD-10-CM | POA: Diagnosis not present

## 2022-04-19 ENCOUNTER — Encounter: Payer: Self-pay | Admitting: Family Medicine

## 2022-04-19 ENCOUNTER — Ambulatory Visit (INDEPENDENT_AMBULATORY_CARE_PROVIDER_SITE_OTHER): Payer: Medicare Other | Admitting: Family Medicine

## 2022-04-19 VITALS — BP 116/62 | HR 61 | Ht 66.0 in | Wt 234.0 lb

## 2022-04-19 DIAGNOSIS — Z7189 Other specified counseling: Secondary | ICD-10-CM

## 2022-04-19 DIAGNOSIS — G4731 Primary central sleep apnea: Secondary | ICD-10-CM | POA: Diagnosis not present

## 2022-04-19 LAB — CUP PACEART REMOTE DEVICE CHECK
Battery Remaining Longevity: 12 mo
Battery Remaining Percentage: 23 %
Brady Statistic RA Percent Paced: 0 %
Brady Statistic RV Percent Paced: 74 %
Date Time Interrogation Session: 20230627224100
Implantable Pulse Generator Implant Date: 20160513
Lead Channel Impedance Value: 365 Ohm
Lead Channel Impedance Value: 615 Ohm
Lead Channel Pacing Threshold Amplitude: 1.4 V
Lead Channel Pacing Threshold Pulse Width: 0.4 ms
Lead Channel Setting Pacing Amplitude: 1.5 V
Lead Channel Setting Pacing Pulse Width: 0.4 ms
Lead Channel Setting Sensing Sensitivity: 0.6 mV
Pulse Gen Serial Number: 732886

## 2022-04-19 NOTE — Patient Instructions (Addendum)
Please continue using your ASV regularly. While your insurance requires that you use ASV at least 4 hours each night on 70% of the nights, I recommend, that you not skip any nights and use it throughout the night if you can. Getting used to ASV and staying with the treatment long term does take time and patience and discipline. Untreated obstructive sleep apnea when it is moderate to severe can have an adverse impact on cardiovascular health and raise her risk for heart disease, arrhythmias, hypertension, congestive heart failure, stroke and diabetes. Untreated obstructive sleep apnea causes sleep disruption, nonrestorative sleep, and sleep deprivation. This can have an impact on your day to day functioning and cause daytime sleepiness and impairment of cognitive function, memory loss, mood disturbance, and problems focussing. Using ASV regularly can improve these symptoms.  [facility] [phone]  Follow up in 1 year

## 2022-04-19 NOTE — Progress Notes (Signed)
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 She is doing well. She is using therapy nightly for about 8 hours. She denies concerns with machine or supplies.   Compliance data from 03/17/2022-04/15/2022 shows she used ASV 29/30 nights. She used ASV greater than 4 hours 29/30 nights (97%). Average usage was 8 hours and 24 min. Residual AHI was 2.5 with min EPAP 5, max EPAP 5, Min PS 3 and max PS 15. No significant leak noted.   04/20/2021 ALL: [name] returns for follow up for complex sleep apnea on ASV therapy. She continues to very well. She is using ASV nightly. She sleeps well. She denies seizure activity. She had Covid last month. She recovered well. She was fully vaccinated and booster. She is no longer taking Plavix. She has continued asa '81mg'$ . Head tremor is stable. She continues to use Rolator.     02/05/2020 ALL:  [name] is a 76 y.o. 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 She reports one episode where she felt as if she were smothering but this has not occurred recently. She is doing well with her machine and has all needed supplies. She does admit to not using ASV 1 night every week as she helps to care for her grandchildren at her daughter's home. She does note benefit of using ASV therapy.  Compliance report dated 11/06/2019 through 02/03/2020 reveals that she used ASV 80 of the past 90 days for compliance of 89%. She used ASV therapy greater than 4 hours 73 of the past 90 days for compliance of 81%. Average usage was 6 hours and 26 minutes. Residual AHI was 1.6 with minimum EPAP of 5 cm of water, minimum pressure support of 3 cm of water and maximum pressure support of 15 cm of water. There was no significant leak noted.  She  discontinued levetiracetam last year. No seizure activity. She continues Plavix '75mg'$  daily in setting of previous CVA, CAD s/p cardiac stents and atril fib. She is followed closely by PCP for stroke prevention and management of co morbidities. She is also followed regularly by cardiology. She requested to stop Plavix at her last follow-up but was instructed to continue due to cardiac stents. She is exercising regularly. She participates with [employer] at her local [employer] 3 days a week. She stays well-hydrated. She does note chronic pain but feels that she is well managed. She uses her Rollator for long distance walking.  HISTORY: (copied from my note on 12/20/2018)  Heather May is a 76 y.o. 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  Compliance report dated 11/16/2018 through 12/15/2018 reveals that she is using BiPAP 29 out of 30 days for compliance 97%.  She is using her BiPAP for greater than 4 hours 23 out of 30 days for compliance of 77%.  Average usage is 5 hours and 32 minutes.  AHI was 4.7.  There is no significant leak.  She reports that she has done well since mask refitting.  She does note benefit from using BiPAP as she is less sleepy during the day.  She admits that she often goes to bed late and does not use the machine for  greater than 4 hours.   She has been seen in the past for [facility] in 2014, seizure post SAH and lucunar infarct in 2019.  It was noted that Keppra should be discontinued however she has continued to take this medication.  She has not had any seizure activity and only one with SAH.Heather May She was also diagnosed with Parkinson's around 2015 but was later told that she had an essential tremor.  She reports that she requested a second opinion after participating in a Parkinson's support group.  She did not feel that she had any symptoms related to Parkinson's with the exception of a head tremor.  Head tremor is worse when she is nervous or tired.  She has not noticed  any changes with her gait or ability to move.  No concerns of memory loss this time.  REVIEW OF SYSTEMS: Out of a complete 14 system review of symptoms, the patient complains only of the following symptoms, chronic pain, fatigue and all other reviewed systems are negative.  ESS: 6  ALLERGIES: Allergies  Allergen Reactions   Hydrocodone Nausea And Vomiting   Sulfa Antibiotics     RASH    HOME MEDICATIONS: Outpatient Medications Prior to Visit  Medication Sig Dispense Refill   aspirin EC 81 MG tablet Take 1 tablet (81 mg total) by mouth daily. Swallow whole. 30 tablet 11   benazepril (LOTENSIN) 20 MG tablet Take 20 mg by mouth daily.     cetirizine (ZYRTEC) 10 MG tablet Take 1 tablet by mouth daily.     Cholecalciferol (VITAMIN D3) 75 MCG (3000 UT) TABS Take 1 tablet by mouth daily.     hydrochlorothiazide (HYDRODIURIL) 25 MG tablet Take 25 mg by mouth daily.      LEVEMIR FLEXTOUCH 100 UNIT/ML Pen      lovastatin (MEVACOR) 40 MG tablet Take 40 mg by mouth at bedtime.     Multiple Vitamin (MULTIVITAMIN) tablet Take 1 tablet by mouth daily.     nitroGLYCERIN (NITROSTAT) 0.4 MG SL tablet Place 1 tablet (0.4 mg total) under the tongue every 5 (five) minutes as needed for chest pain. 25 tablet 1   ondansetron (ZOFRAN) 4 MG tablet Take 4 mg by mouth every 8 (eight) hours as needed for nausea or vomiting.     Probiotic Product (PROBIOTIC PO) Take 1 capsule by mouth daily.     TRULICITY 1.61 WR/6.0AV SOPN SMARTSIG:0.5 Milliliter(s) SUB-Q Once a Week     diltiazem (CARDIZEM) 30 MG tablet TAKE 1 TABLET(30 MG) BY MOUTH DAILY AS NEEDED 30 tablet 8   No facility-administered medications prior to visit.    PAST MEDICAL HISTORY: Past Medical History:  Diagnosis Date   Arthritis    joints   Blood transfusion without reported diagnosis    "when oldest daughter was born"   CAD (coronary artery disease) 08/15/13   Diabetes mellitus without complication (HCC)    Diverticulosis    Essential  tremor    GERD (gastroesophageal reflux disease)    History of long-term use of multiple prescription drugs    Hyperlipidemia    mixed   Hypertension    Long-term (current) use of anticoagulants    PAD (peripheral artery disease) (HCC)    Percutaneous transluminal coronary angioplasty status    Permanent atrial fibrillation (HCC)    chads2vasc score is at least 5   Pulmonary embolism ([place])    Pulmonary fibrosis ([place])    Seizure ([place]) 2014   occured in setting of [place]   Sick  sinus syndrome ([place])    Spinal stenosis    Stroke ([place])    Subarachnoid hemorrhage ([place])    Transient ischemic attack     PAST SURGICAL HISTORY: Past Surgical History:  Procedure Laterality Date   ABDOMINAL HYSTERECTOMY  2004   BASAL CELL CARCINOMA EXCISION     CARDIAC CATHETERIZATION  10/14   PCI    carpel tunnel surgery Bilateral 2006/ 2010   [place]   x 1   COLONOSCOPY     HERNIA REPAIR  06/2010   LEFT ATRIAL APPENDAGE OCCLUSION N/A 08/26/2015   Procedure: LEFT ATRIAL APPENDAGE OCCLUSION;  Surgeon: Thompson Grayer, MD;  Location: [facility];  Service: Cardiovascular;  Laterality: N/A;   PACEMAKER INSERTION  03/05/15   [name] Accolade DR implanted in [place]   POLYPECTOMY     removal of big toe nail Bilateral    TEE WITHOUT CARDIOVERSION N/A 08/16/2015   Procedure: TRANSESOPHAGEAL ECHOCARDIOGRAM (TEE);  Surgeon: Pixie Casino, MD;  Location: [facility] ENDOSCOPY;  Service: Cardiovascular;  Laterality: N/A;   TEE WITHOUT CARDIOVERSION N/A 10/12/2015   Procedure: TRANSESOPHAGEAL ECHOCARDIOGRAM (TEE);  Surgeon: Pixie Casino, MD;  Location: [facility] ENDOSCOPY;  Service: Cardiovascular;  Laterality: N/A;   titanium pins inserted metatarsol bone  02/2012   2 pins placed     FAMILY HISTORY: Family History  Problem Relation Age of Onset   Stroke Mother    Epilepsy Mother    Stroke Father    Heart attack Father    Atrial fibrillation Sister    Atrial fibrillation  Brother    Diabetes Mellitus II Brother    Pulmonary embolism Son    Colon cancer Neg Hx    Esophageal cancer Neg Hx    Stomach cancer Neg Hx    Rectal cancer Neg Hx    Colon polyps Neg Hx     SOCIAL HISTORY: Social History   Socioeconomic History   Marital status: Significant Other    Spouse name: Not on file   Number of children: 5   Years of education: 12   Highest education level: Not on file  Occupational History   Occupation: Retired  Tobacco Use   Smoking status: Former    Types: Cigarettes    Quit date: 07/06/2009    Years since quitting: 12.7   Smokeless tobacco: Never   Tobacco comments:    didnt smoke much  Vaping Use   Vaping Use: Never used  Substance and Sexual Activity   Alcohol use: No    Alcohol/week: 0.0 standard drinks of alcohol    Comment: very infrequently   Drug use: No   Sexual activity: Not on file  Other Topics Concern   Not on file  Social History Narrative   Pt recently moved to [place].    Previously worked as a Teacher, music.   Caffeine use: only when eating out tea/soda   Right-handed   Lives at home with partner/roommate   Social Determinants of Health   Financial Resource Strain: Not on file  Food Insecurity: Not on file  Transportation Needs: Not on file  Physical Activity: Not on file  Stress: Not on file  Social Connections: Not on file  Intimate Partner Violence: Not on file      PHYSICAL EXAM  Vitals:   04/19/22 0905  BP: 116/62  Pulse: 61  SpO2: 98%  Weight: 234 lb (106.1 kg)  Height: '5\' 6"'$  (1.676 m)  Body mass index is 37.77 kg/m.  Generalized: Well developed, in no acute distress  Cardiology: normal rate and rhythm, no murmur noted Respiratory: clear to auscultation bilaterally  Neurological examination  Mentation: Alert oriented to time, place, history taking. Follows all commands speech and language fluent Cranial nerve II-XII: Pupils were equal round reactive to  light. Extraocular movements were full, visual field were full on confrontational test. Facial sensation and strength were normal. Uvula tongue midline. Head turning and shoulder shrug  were normal and symmetric. Motor: The motor testing reveals 5 over 5 strength of bilateral upper extremities. 4+ of 5 of the left lower extremity, 4/5 of right lower extremity. Good symmetric motor tone is noted throughout. Head tremor noted, worse when talking in exam room.  Gait and station: Uses both arms to push to a standing position. Gait is short but stable with Rolator. Tandem gait not assessed today.    DIAGNOSTIC DATA (LABS, IMAGING, TESTING) - I reviewed patient records, labs, notes, testing and imaging myself where available.      No data to display           Lab Results  Component Value Date   WBC 6.3 09/06/2020   HGB 12.6 09/06/2020   HCT 37.5 09/06/2020   MCV 100 (H) 09/06/2020   PLT 212 09/06/2020      Component Value Date/Time   NA 136 10/13/2015 1053   K 4.2 10/13/2015 1053   CL 100 10/13/2015 1053   CO2 25 10/13/2015 1053   GLUCOSE 303 (H) 10/13/2015 1053   BUN 15 10/13/2015 1053   CREATININE 0.74 10/13/2015 1053   CALCIUM 9.5 10/13/2015 1053   PROT 7.2 06/14/2016 1145   GFRNONAA >60 08/27/2015 0347   GFRAA >60 08/27/2015 0347   No results found for: "CHOL", "HDL", "LDLCALC", "LDLDIRECT", "TRIG", "CHOLHDL" No results found for: "HGBA1C" Lab Results  Component Value Date   VITAMINB12 648 06/14/2016   Lab Results  Component Value Date   TSH 1.470 06/14/2016    ASSESSMENT AND PLAN 76 y.o. year old female  has a past medical history of Arthritis, Blood transfusion without reported diagnosis, CAD (coronary artery disease) (08/15/13), Diabetes mellitus without complication ([place]), Diverticulosis, Essential tremor, GERD (gastroesophageal reflux disease), History of long-term use of multiple prescription drugs, Hyperlipidemia, Hypertension, Long-term (current) use of  anticoagulants, PAD (peripheral artery disease) ([place]), Percutaneous transluminal coronary angioplasty status, Permanent atrial fibrillation ([place]), Pulmonary embolism ([place]), Pulmonary fibrosis ([place]), Seizure ([place]) (2014), Sick sinus syndrome ([place]), Spinal stenosis, Stroke ([place]), Subarachnoid hemorrhage ([place]), and Transient ischemic attack. here with     ICD-10-CM   1. Complex sleep apnea syndrome  G47.31 For home use only DME Bipap    2. Encounter for counseling on adaptive servo-ventilation (ASV) use  Z71.89 For home use only DME Bipap       Jhene is doing fairly well today. She continues ASV therapy without any significant difficulty. Compliance report shows excellent compliance. She will continue close follow-up with primary care and cardiology for management of co morbidities and stroke prevention. She will continue healthy lifestyle habits with well-balanced diet and regular exercise. She will follow-up with [place] in 1 year, sooner if needed. She verbalizes understanding and agreement with this plan.   Orders Placed This Encounter  Procedures   For home use only DME Bipap    Supplies    Order Specific Question:   Length of Need    Answer:   Lifetime    Order Specific Question:  Inspiratory pressure    Answer:   OTHER SEE COMMENTS    Order Specific Question:   Expiratory pressure    Answer:   OTHER SEE COMMENTS      No orders of the defined types were placed in this encounter.      Debbora Presto, FNP-C 04/19/2022, 9:26 AM [facility] [address] [place], [place] [address] [phone]

## 2022-04-19 NOTE — Progress Notes (Signed)
CM sent to [facility] for new order ?

## 2022-04-20 ENCOUNTER — Ambulatory Visit: Payer: Medicare Other | Admitting: Family Medicine

## 2022-04-28 ENCOUNTER — Emergency Department (HOSPITAL_COMMUNITY)
Admission: EM | Admit: 2022-04-28 | Discharge: 2022-04-28 | Disposition: A | Discharge status: Home / Self Care | Payer: No Typology Code available for payment source | Attending: Emergency Medicine | Admitting: Emergency Medicine

## 2022-04-28 ENCOUNTER — Emergency Department (HOSPITAL_COMMUNITY): Payer: No Typology Code available for payment source

## 2022-04-28 ENCOUNTER — Other Ambulatory Visit: Payer: Self-pay

## 2022-04-28 DIAGNOSIS — R0789 Other chest pain: Secondary | ICD-10-CM | POA: Diagnosis not present

## 2022-04-28 DIAGNOSIS — J9811 Atelectasis: Secondary | ICD-10-CM | POA: Diagnosis not present

## 2022-04-28 DIAGNOSIS — Z79899 Other long term (current) drug therapy: Secondary | ICD-10-CM | POA: Diagnosis not present

## 2022-04-28 DIAGNOSIS — K449 Diaphragmatic hernia without obstruction or gangrene: Secondary | ICD-10-CM | POA: Diagnosis not present

## 2022-04-28 DIAGNOSIS — R079 Chest pain, unspecified: Secondary | ICD-10-CM | POA: Diagnosis not present

## 2022-04-28 DIAGNOSIS — Z041 Encounter for examination and observation following transport accident: Secondary | ICD-10-CM | POA: Diagnosis not present

## 2022-04-28 DIAGNOSIS — S299XXA Unspecified injury of thorax, initial encounter: Secondary | ICD-10-CM | POA: Diagnosis not present

## 2022-04-28 DIAGNOSIS — M47812 Spondylosis without myelopathy or radiculopathy, cervical region: Secondary | ICD-10-CM | POA: Diagnosis not present

## 2022-04-28 DIAGNOSIS — Y92410 Unspecified street and highway as the place of occurrence of the external cause: Secondary | ICD-10-CM | POA: Diagnosis not present

## 2022-04-28 DIAGNOSIS — S0990XA Unspecified injury of head, initial encounter: Secondary | ICD-10-CM | POA: Diagnosis not present

## 2022-04-28 DIAGNOSIS — R519 Headache, unspecified: Secondary | ICD-10-CM | POA: Diagnosis not present

## 2022-04-28 DIAGNOSIS — K429 Umbilical hernia without obstruction or gangrene: Secondary | ICD-10-CM | POA: Diagnosis not present

## 2022-04-28 DIAGNOSIS — R1012 Left upper quadrant pain: Secondary | ICD-10-CM | POA: Diagnosis not present

## 2022-04-28 DIAGNOSIS — S3991XA Unspecified injury of abdomen, initial encounter: Secondary | ICD-10-CM | POA: Diagnosis not present

## 2022-04-28 DIAGNOSIS — K5730 Diverticulosis of large intestine without perforation or abscess without bleeding: Secondary | ICD-10-CM | POA: Diagnosis not present

## 2022-04-28 DIAGNOSIS — R1032 Left lower quadrant pain: Secondary | ICD-10-CM | POA: Diagnosis not present

## 2022-04-28 DIAGNOSIS — R52 Pain, unspecified: Secondary | ICD-10-CM | POA: Diagnosis not present

## 2022-04-28 DIAGNOSIS — I700 Atherosclerosis of aorta: Secondary | ICD-10-CM | POA: Diagnosis not present

## 2022-04-28 DIAGNOSIS — R1084 Generalized abdominal pain: Secondary | ICD-10-CM | POA: Diagnosis not present

## 2022-04-28 DIAGNOSIS — Z7982 Long term (current) use of aspirin: Secondary | ICD-10-CM | POA: Diagnosis not present

## 2022-04-28 LAB — LIPASE, BLOOD: Lipase: 33 U/L (ref 11–51)

## 2022-04-28 LAB — COMPREHENSIVE METABOLIC PANEL
ALT: 36 U/L (ref 0–44)
AST: 56 U/L — ABNORMAL HIGH (ref 15–41)
Albumin: 3.8 g/dL (ref 3.5–5.0)
Alkaline Phosphatase: 80 U/L (ref 38–126)
Anion gap: 13 (ref 5–15)
BUN: 21 mg/dL (ref 8–23)
CO2: 25 mmol/L (ref 22–32)
Calcium: 9.4 mg/dL (ref 8.9–10.3)
Chloride: 102 mmol/L (ref 98–111)
Creatinine, Ser: 1.18 mg/dL — ABNORMAL HIGH (ref 0.44–1.00)
GFR, Estimated: 48 mL/min — ABNORMAL LOW (ref >60)
Glucose, Bld: 168 mg/dL — ABNORMAL HIGH (ref 70–99)
Potassium: 4.1 mmol/L (ref 3.5–5.1)
Sodium: 140 mmol/L (ref 135–145)
Total Bilirubin: 0.7 mg/dL (ref 0.3–1.2)
Total Protein: 6.9 g/dL (ref 6.5–8.1)

## 2022-04-28 LAB — CBC WITH DIFFERENTIAL/PLATELET
Abs Immature Granulocytes: 0.07 10*3/uL (ref 0.00–0.07)
Basophils Absolute: 0.1 10*3/uL (ref 0.0–0.1)
Basophils Relative: 1 %
Eosinophils Absolute: 0.2 10*3/uL (ref 0.0–0.5)
Eosinophils Relative: 2 %
HCT: 36.6 % (ref 36.0–46.0)
Hemoglobin: 12.4 g/dL (ref 12.0–15.0)
Immature Granulocytes: 1 %
Lymphocytes Relative: 9 %
Lymphs Abs: 0.9 10*3/uL (ref 0.7–4.0)
MCH: 32.5 pg (ref 26.0–34.0)
MCHC: 33.9 g/dL (ref 30.0–36.0)
MCV: 95.8 fL (ref 80.0–100.0)
Monocytes Absolute: 0.7 10*3/uL (ref 0.1–1.0)
Monocytes Relative: 7 %
Neutro Abs: 8.3 10*3/uL — ABNORMAL HIGH (ref 1.7–7.7)
Neutrophils Relative %: 80 %
Platelets: 204 10*3/uL (ref 150–400)
RBC: 3.82 MIL/uL — ABNORMAL LOW (ref 3.87–5.11)
RDW: 13.6 % (ref 11.5–15.5)
WBC: 10.3 10*3/uL (ref 4.0–10.5)
nRBC: 0 % (ref 0.0–0.2)

## 2022-04-28 MED ORDER — ACETAMINOPHEN 325 MG PO TABS
650.0000 mg | ORAL_TABLET | Freq: Once | ORAL | Status: AC
  Administered 2022-04-28: 650 mg via ORAL
  Filled 2022-04-28: qty 2

## 2022-04-28 MED ORDER — IOHEXOL 300 MG/ML  SOLN
80.0000 mL | Freq: Once | INTRAMUSCULAR | Status: AC | PRN
  Administered 2022-04-28: 80 mL via INTRAVENOUS

## 2022-04-28 MED ORDER — SODIUM CHLORIDE 0.9 % IV BOLUS
1000.0000 mL | Freq: Once | INTRAVENOUS | Status: AC
  Administered 2022-04-28: 1000 mL via INTRAVENOUS
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
## 2022-04-28 NOTE — Discharge Instructions (Signed)
Call your primary care doctor or specialist as discussed in the next 2-3 days.   Return immediately back to the ER if:  Your symptoms worsen within the next 12-24 hours. You develop new symptoms such as new fevers, persistent vomiting, new pain, shortness of breath, or new weakness or numbness, or if you have any other concerns.  
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
## 2022-05-02 DIAGNOSIS — T148XXA Other injury of unspecified body region, initial encounter: Secondary | ICD-10-CM | POA: Diagnosis not present

## 2022-05-02 DIAGNOSIS — R52 Pain, unspecified: Secondary | ICD-10-CM | POA: Diagnosis not present

## 2022-05-03 ENCOUNTER — Encounter: Payer: Self-pay | Admitting: Physician Assistant

## 2022-05-03 ENCOUNTER — Telehealth: Payer: Self-pay | Admitting: *Deleted

## 2022-05-03 ENCOUNTER — Ambulatory Visit (INDEPENDENT_AMBULATORY_CARE_PROVIDER_SITE_OTHER): Payer: Medicare Other | Admitting: Physician Assistant

## 2022-05-03 VITALS — BP 110/50 | HR 62 | Ht 66.0 in | Wt 237.0 lb

## 2022-05-03 DIAGNOSIS — I4821 Permanent atrial fibrillation: Secondary | ICD-10-CM

## 2022-05-03 DIAGNOSIS — I2510 Atherosclerotic heart disease of native coronary artery without angina pectoris: Secondary | ICD-10-CM | POA: Diagnosis not present

## 2022-05-03 DIAGNOSIS — Z950 Presence of cardiac pacemaker: Secondary | ICD-10-CM

## 2022-05-03 NOTE — Telephone Encounter (Signed)
   Primary Cardiologist: Candee Furbish, MD  Chart reviewed as part of pre-operative protocol coverage. Simple dental extractions are considered low risk procedures per guidelines and generally do not require any specific cardiac clearance. It is also generally accepted that for simple extractions and dental cleanings, there is no need to interrupt blood thinner therapy.   SBE prophylaxis is not required for the patient.  I will route this recommendation to the requesting party via Epic fax function and remove from pre-op pool.  Please call with questions.  Emmaline Life, NP-C    05/03/2022, 5:01 PM [place] [address] Office [phone] Fax [phone]

## 2022-05-03 NOTE — Telephone Encounter (Signed)
   Pre-operative Risk Assessment    Patient Name: Heather May  DOB: [birth year] MRN: [id number]     Request for Surgical Clearance    Procedure:  Dental Extraction - Amount of Teeth to be Pulled:  1  Date of Surgery:  Clearance TBD                                 Surgeon:  None listed Surgeon's Group or Practice Name:  [facility] Phone number:  [phone] Fax number:  [phone]   Type of Clearance Requested:   - Pharmacy:  Hold Aspirin Needs Instructions   Type of Anesthesia:   Fentanyl and Versed   Additional requests/questions:    Signed, Greer Ee   05/03/2022, 3:59 PM

## 2022-05-03 NOTE — Patient Instructions (Signed)
Medication Instructions:  Your physician recommends that you continue on your current medications as directed. Please refer to the Current Medication list given to you today.  *If you need a refill on your cardiac medications before your next appointment, please call your pharmacy*   Lab Work: NONE If you have labs (blood work) drawn today and your tests are completely normal, you will receive your results only by: [place] (if you have MyChart) OR A paper copy in the mail If you have any lab test that is abnormal or we need to change your treatment, we will call you to review the results.   Testing/Procedures: NONE   Follow-Up: At [facility], you and your health needs are our priority.  As part of our continuing mission to provide you with exceptional heart care, we have created designated Provider Care Teams.  These Care Teams include your primary Cardiologist (physician) and Advanced Practice Providers (APPs -  Physician Assistants and Nurse Practitioners) who all work together to provide you with the care you need, when you need it.  We recommend signing up for the patient portal called "MyChart".  Sign up information is provided on this After Visit Summary.  MyChart is used to connect with patients for Virtual Visits (Telemedicine).  Patients are able to view lab/test results, encounter notes, upcoming appointments, etc.  Non-urgent messages can be sent to your provider as well.   To learn more about what you can do with MyChart, go to [email or web address].    Your next appointment:   9 month(s) AS A NEW PATIENT  The format for your next appointment:   In Person  Provider:   Lars Mage, MD{    Important Information About Sugar

## 2022-05-10 NOTE — Progress Notes (Signed)
Remote pacemaker transmission.   

## 2022-05-15 ENCOUNTER — Ambulatory Visit (INDEPENDENT_AMBULATORY_CARE_PROVIDER_SITE_OTHER): Payer: Medicare Other | Admitting: Cardiology

## 2022-05-15 ENCOUNTER — Encounter: Payer: Self-pay | Admitting: Cardiology

## 2022-05-15 VITALS — BP 110/60 | HR 67 | Ht 66.0 in | Wt 230.0 lb

## 2022-05-15 DIAGNOSIS — I2510 Atherosclerotic heart disease of native coronary artery without angina pectoris: Secondary | ICD-10-CM

## 2022-05-15 DIAGNOSIS — I4821 Permanent atrial fibrillation: Secondary | ICD-10-CM | POA: Diagnosis not present

## 2022-05-15 NOTE — Patient Instructions (Addendum)
Medication Instructions:  The current medical regimen is effective;  continue present plan and medications.  *If you need a refill on your cardiac medications before your next appointment, please call your pharmacy*  Testing: Your physician has requested that you have an echocardiogram. Echocardiography is a painless test that uses sound waves to create images of your heart. It provides your doctor with information about the size and shape of your heart and how well your heart's chambers and valves are working. This procedure takes approximately one hour. There are no restrictions for this procedure.   Follow-Up: At [facility], you and your health needs are our priority.  As part of our continuing mission to provide you with exceptional heart care, we have created designated Provider Care Teams.  These Care Teams include your primary Cardiologist (physician) and Advanced Practice Providers (APPs -  Physician Assistants and Nurse Practitioners) who all work together to provide you with the care you need, when you need it.  We recommend signing up for the patient portal called "MyChart".  Sign up information is provided on this After Visit Summary.  MyChart is used to connect with patients for Virtual Visits (Telemedicine).  Patients are able to view lab/test results, encounter notes, upcoming appointments, etc.  Non-urgent messages can be sent to your provider as well.   To learn more about what you can do with MyChart, go to [email or web address].    Your next appointment:   1 year(s)  The format for your next appointment:   In Person  Provider:   Candee Furbish, MD {   Important Information About Sugar
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
## 2022-05-16 DIAGNOSIS — I4891 Unspecified atrial fibrillation: Secondary | ICD-10-CM | POA: Diagnosis not present

## 2022-05-16 DIAGNOSIS — Z23 Encounter for immunization: Secondary | ICD-10-CM | POA: Diagnosis not present

## 2022-05-16 DIAGNOSIS — Z7185 Encounter for immunization safety counseling: Secondary | ICD-10-CM | POA: Diagnosis not present

## 2022-05-16 DIAGNOSIS — T07XXXA Unspecified multiple injuries, initial encounter: Secondary | ICD-10-CM | POA: Diagnosis not present

## 2022-06-05 ENCOUNTER — Ambulatory Visit (HOSPITAL_COMMUNITY): Payer: Medicare Other | Attending: Cardiology

## 2022-06-05 DIAGNOSIS — I4821 Permanent atrial fibrillation: Secondary | ICD-10-CM | POA: Diagnosis not present

## 2022-06-05 LAB — ECHOCARDIOGRAM COMPLETE
Area-P 1/2: 2.06 cm2
P 1/2 time: 670 msec
S' Lateral: 2.6 cm

## 2022-06-07 ENCOUNTER — Telehealth: Payer: Self-pay | Admitting: *Deleted

## 2022-06-07 DIAGNOSIS — I4819 Other persistent atrial fibrillation: Secondary | ICD-10-CM

## 2022-06-07 DIAGNOSIS — I77810 Thoracic aortic ectasia: Secondary | ICD-10-CM

## 2022-06-07 NOTE — Telephone Encounter (Signed)
Normal pump function, ejection fraction 75%.  No significant valvular abnormalities.  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  Repeat echocardiogram in 1 year to monitor aorta.  Overall no significant echocardiographic findings that would explain increased shortness of breath/fatigue.   Heather Furbish, MD   Per has reviewed the above results via MyChart.  Left message for her on VM to call back with any questions or concerns.  Order placed for repeat echo in 1 yr.

## 2022-06-13 ENCOUNTER — Telehealth: Payer: Self-pay | Admitting: Cardiology

## 2022-06-13 NOTE — Telephone Encounter (Signed)
Patient called for her Echo results.

## 2022-06-13 NOTE — Telephone Encounter (Signed)
Jerline Pain, MD  06/05/2022  4:56 PM EDT     Normal pump function, ejection fraction 75%.  No significant valvular abnormalities. 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 Repeat echocardiogram in 1 year to monitor aorta. Overall no significant echocardiographic findings that would explain increased shortness of breath/fatigue.     The patient has been notified of the result and verbalized understanding.  All questions (if any) were answered. Antonieta Iba, RN 06/13/2022 3:46 PM

## 2022-06-15 DIAGNOSIS — Z87898 Personal history of other specified conditions: Secondary | ICD-10-CM | POA: Diagnosis not present

## 2022-06-15 DIAGNOSIS — R110 Nausea: Secondary | ICD-10-CM | POA: Diagnosis not present

## 2022-06-15 DIAGNOSIS — R42 Dizziness and giddiness: Secondary | ICD-10-CM | POA: Diagnosis not present

## 2022-07-04 DIAGNOSIS — I10 Essential (primary) hypertension: Secondary | ICD-10-CM | POA: Diagnosis not present

## 2022-07-04 DIAGNOSIS — E559 Vitamin D deficiency, unspecified: Secondary | ICD-10-CM | POA: Diagnosis not present

## 2022-07-04 DIAGNOSIS — E782 Mixed hyperlipidemia: Secondary | ICD-10-CM | POA: Diagnosis not present

## 2022-07-04 DIAGNOSIS — E118 Type 2 diabetes mellitus with unspecified complications: Secondary | ICD-10-CM | POA: Diagnosis not present

## 2022-07-05 ENCOUNTER — Telehealth: Payer: Self-pay | Admitting: Cardiology

## 2022-07-05 NOTE — Telephone Encounter (Signed)
Pt states that her monitor is showing a picture of a doctor and saying call. Pt states that it has never done this before and thought that she may need to call. Please advise

## 2022-07-10 DIAGNOSIS — E1165 Type 2 diabetes mellitus with hyperglycemia: Secondary | ICD-10-CM | POA: Diagnosis not present

## 2022-07-10 DIAGNOSIS — I10 Essential (primary) hypertension: Secondary | ICD-10-CM | POA: Diagnosis not present

## 2022-07-10 DIAGNOSIS — E782 Mixed hyperlipidemia: Secondary | ICD-10-CM | POA: Diagnosis not present

## 2022-07-10 DIAGNOSIS — Z23 Encounter for immunization: Secondary | ICD-10-CM | POA: Diagnosis not present

## 2022-07-10 DIAGNOSIS — E1121 Type 2 diabetes mellitus with diabetic nephropathy: Secondary | ICD-10-CM | POA: Diagnosis not present

## 2022-07-10 NOTE — Telephone Encounter (Signed)
LMOVM for patient to call [employer] tech support.

## 2022-07-25 ENCOUNTER — Ambulatory Visit (INDEPENDENT_AMBULATORY_CARE_PROVIDER_SITE_OTHER): Payer: Medicare Other

## 2022-07-25 DIAGNOSIS — I219 Acute myocardial infarction, unspecified: Secondary | ICD-10-CM | POA: Diagnosis not present

## 2022-07-26 LAB — CUP PACEART REMOTE DEVICE CHECK
Battery Remaining Longevity: 12 mo
Battery Remaining Percentage: 20 %
Brady Statistic RA Percent Paced: 0 %
Brady Statistic RV Percent Paced: 79 %
Date Time Interrogation Session: 20231003163400
Implantable Pulse Generator Implant Date: 20160513
Lead Channel Impedance Value: 364 Ohm
Lead Channel Impedance Value: 642 Ohm
Lead Channel Pacing Threshold Amplitude: 1.4 V
Lead Channel Pacing Threshold Pulse Width: 0.4 ms
Lead Channel Setting Pacing Amplitude: 1.5 V
Lead Channel Setting Pacing Pulse Width: 0.4 ms
Lead Channel Setting Sensing Sensitivity: 0.6 mV
Pulse Gen Serial Number: 732886

## 2022-08-07 NOTE — Progress Notes (Signed)
Remote pacemaker transmission.   

## 2022-08-21 DIAGNOSIS — Z23 Encounter for immunization: Secondary | ICD-10-CM | POA: Diagnosis not present

## 2022-08-21 DIAGNOSIS — E1165 Type 2 diabetes mellitus with hyperglycemia: Secondary | ICD-10-CM | POA: Diagnosis not present

## 2022-08-21 DIAGNOSIS — I10 Essential (primary) hypertension: Secondary | ICD-10-CM | POA: Diagnosis not present

## 2022-08-28 DIAGNOSIS — Z0000 Encounter for general adult medical examination without abnormal findings: Secondary | ICD-10-CM | POA: Diagnosis not present

## 2022-08-28 DIAGNOSIS — Z1331 Encounter for screening for depression: Secondary | ICD-10-CM | POA: Diagnosis not present

## 2022-08-28 DIAGNOSIS — Z1339 Encounter for screening examination for other mental health and behavioral disorders: Secondary | ICD-10-CM | POA: Diagnosis not present

## 2022-09-04 DIAGNOSIS — E119 Type 2 diabetes mellitus without complications: Secondary | ICD-10-CM | POA: Diagnosis not present

## 2022-09-04 DIAGNOSIS — H2513 Age-related nuclear cataract, bilateral: Secondary | ICD-10-CM | POA: Diagnosis not present

## 2022-09-04 DIAGNOSIS — H524 Presbyopia: Secondary | ICD-10-CM | POA: Diagnosis not present

## 2022-09-26 NOTE — Progress Notes (Signed)
PATIENT: Heather May DOB: August 02, 1946  REASON FOR VISIT: follow up HISTORY FROM: patient  No chief complaint on file.   HISTORY OF PRESENT ILLNESS:  09/27/2022 ALL: Heather May returns for follow up for complex slep apnea on ASV therapy. She was last seen 03/2022 and doing well. Annual follow up advised. 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 She is doing well. She is using therapy nightly for about 8 hours. She denies concerns with machine or supplies.   Compliance data from 03/17/2022-04/15/2022 shows she used ASV 29/30 nights. She used ASV greater than 4 hours 29/30 nights (97%). Average usage was 8 hours and 24 min. Residual AHI was 2.5 with min EPAP 5, max EPAP 5, Min PS 3 and max PS 15. No significant leak noted.   04/20/2021 ALL: [name] returns for follow up for complex sleep apnea on ASV therapy. She continues to very well. She is using ASV nightly. She sleeps well. She denies seizure activity. She had Covid last month. She recovered well. She was fully vaccinated and booster. She is no longer taking Plavix. She has continued asa '81mg'$ . Head tremor is stable. She continues to use Rolator.     02/05/2020 ALL:  Heather May is a 76 y.o. 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 She reports one episode where she felt as if she were smothering but this has not occurred recently. She is doing well with her machine and has all needed supplies. She does admit to not using ASV 1 night every week as she helps to care for her grandchildren at her daughter's home. She does note benefit of using ASV therapy.  Compliance report dated 11/06/2019 through 02/03/2020 reveals that she used ASV 80 of the past 90 days for compliance of 89%. She used ASV therapy greater than 4 hours 73 of the past 90 days for compliance of 81%. Average usage was 6 hours and 26 minutes. Residual AHI was 1.6 with minimum EPAP of 5 cm of water, minimum pressure  support of 3 cm of water and maximum pressure support of 15 cm of water. There was no significant leak noted.  She discontinued levetiracetam last year. No seizure activity. She continues Plavix '75mg'$  daily in setting of previous CVA, CAD s/p cardiac stents and atril fib. She is followed closely by PCP for stroke prevention and management of co morbidities. She is also followed regularly by cardiology. She requested to stop Plavix at her last follow-up but was instructed to continue due to cardiac stents. She is exercising regularly. She participates with [employer] at her local [employer] 3 days a week. She stays well-hydrated. She does note chronic pain but feels that she is well managed. She uses her Rollator for long distance walking.  HISTORY: (copied from my note on 12/20/2018)  Heather May is a 76 y.o. 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  Compliance report dated 11/16/2018 through 12/15/2018 reveals that she is using BiPAP 29 out of 30 days for compliance 97%.  She is using her BiPAP for greater than 4 hours 23 out of 30 days for compliance of 77%.  Average usage is 5 hours and 32 minutes.  AHI was 4.7.  There is no significant leak.  She reports that she has done well since mask refitting.  She does note benefit from using BiPAP as she is less sleepy during  the day.  She admits that she often goes to bed late and does not use the machine for greater than 4 hours.   She has been seen in the past for [facility] in 2014, seizure post SAH and lucunar infarct in 2019.  It was noted that Keppra should be discontinued however she has continued to take this medication.  She has not had any seizure activity and only one with SAH.Heather May She was also diagnosed with Parkinson's around 2015 but was later told that she had an essential tremor.  She reports that she requested a second opinion after participating in a Parkinson's support group.  She did not feel that she had any symptoms related to  Parkinson's with the exception of a head tremor.  Head tremor is worse when she is nervous or tired.  She has not noticed any changes with her gait or ability to move.  No concerns of memory loss this time.  REVIEW OF SYSTEMS: Out of a complete 14 system review of symptoms, the patient complains only of the following symptoms, chronic pain, fatigue and all other reviewed systems are negative.  ESS: 6  ALLERGIES: Allergies  Allergen Reactions   Hydrocodone Nausea And Vomiting    Other reaction(s): other   Sulfa Antibiotics     RASH    HOME MEDICATIONS: Outpatient Medications Prior to Visit  Medication Sig Dispense Refill   aspirin EC 81 MG tablet Take 1 tablet (81 mg total) by mouth daily. Swallow whole. 30 tablet 11   benazepril (LOTENSIN) 20 MG tablet Take 20 mg by mouth daily.     cetirizine (ZYRTEC) 10 MG tablet Take 1 tablet by mouth daily.     Cholecalciferol (VITAMIN D3) 75 MCG (3000 UT) TABS Take 1 tablet by mouth daily.     hydrochlorothiazide (HYDRODIURIL) 25 MG tablet Take 25 mg by mouth daily.      LEVEMIR FLEXTOUCH 100 UNIT/ML Pen      lovastatin (MEVACOR) 40 MG tablet Take 40 mg by mouth at bedtime.     metFORMIN (GLUCOPHAGE) 500 MG tablet Take 500 mg by mouth 2 (two) times daily.     Multiple Vitamin (MULTIVITAMIN) tablet Take 1 tablet by mouth daily.     nitroGLYCERIN (NITROSTAT) 0.4 MG SL tablet Place 1 tablet (0.4 mg total) under the tongue every 5 (five) minutes as needed for chest pain. 25 tablet 1   ondansetron (ZOFRAN) 4 MG tablet Take 4 mg by mouth every 8 (eight) hours as needed for nausea or vomiting.     Probiotic Product (PROBIOTIC PO) Take 1 capsule by mouth daily.     TRULICITY 4.09 WJ/1.9JY SOPN SMARTSIG:0.5 Milliliter(s) SUB-Q Once a Week     No facility-administered medications prior to visit.    PAST MEDICAL HISTORY: Past Medical History:  Diagnosis Date   Arthritis    joints   Blood transfusion without reported diagnosis    "when oldest  daughter was born"   CAD (coronary artery disease) 08/15/13   Diabetes mellitus without complication (HCC)    Diverticulosis    Essential tremor    GERD (gastroesophageal reflux disease)    History of long-term use of multiple prescription drugs    Hyperlipidemia    mixed   Hypertension    Long-term (current) use of anticoagulants    PAD (peripheral artery disease) (HCC)    Percutaneous transluminal coronary angioplasty status    Permanent atrial fibrillation (HCC)    chads2vasc score is at least 5   Pulmonary  embolism ([place])    Pulmonary fibrosis ([place])    Seizure ([place]) 2014   occured in setting of [place]   Sick sinus syndrome ([place])    Spinal stenosis    Stroke ([place])    Subarachnoid hemorrhage ([place])    Transient ischemic attack     PAST SURGICAL HISTORY: Past Surgical History:  Procedure Laterality Date   ABDOMINAL HYSTERECTOMY  2004   BASAL CELL CARCINOMA EXCISION     CARDIAC CATHETERIZATION  10/14   PCI    carpel tunnel surgery Bilateral 2006/ 2010   [place]   x 1   COLONOSCOPY     HERNIA REPAIR  06/2010   LEFT ATRIAL APPENDAGE OCCLUSION N/A 08/26/2015   Procedure: LEFT ATRIAL APPENDAGE OCCLUSION;  Surgeon: Thompson Grayer, MD;  Location: [facility];  Service: Cardiovascular;  Laterality: N/A;   PACEMAKER INSERTION  03/05/15   [name] Acc[address]ade DR implanted in [place]   POLYPECTOMY     removal of big toe nail Bilateral    TEE WITHOUT CARDIOVERSION N/A 08/16/2015   Procedure: TRANSESOPHAGEAL ECHOCARDIOGRAM (TEE);  Surgeon: Pixie Casino, MD;  Location: [facility] ENDOSCOPY;  Service: Cardiovascular;  Laterality: N/A;   TEE WITHOUT CARDIOVERSION N/A 10/12/2015   Procedure: TRANSESOPHAGEAL ECHOCARDIOGRAM (TEE);  Surgeon: Pixie Casino, MD;  Location: [facility];  Service: Cardiovascular;  Laterality: N/A;   titanium pins inserted metatarsol bone  02/2012   2 pins placed     FAMILY HISTORY: Family History  Problem Relation Age of  Onset   Stroke Mother    Epilepsy Mother    Stroke Father    Heart attack Father    Atrial fibrillation Sister    Atrial fibrillation Brother    Diabetes Mellitus II Brother    Pulmonary embolism Son    Colon cancer Neg Hx    Esophageal cancer Neg Hx    Stomach cancer Neg Hx    Rectal cancer Neg Hx    Colon polyps Neg Hx     SOCIAL HISTORY: Social History   Socioeconomic History   Marital status: Significant Other    Spouse name: Not on file   Number of children: 5   Years of education: 12   Highest education level: Not on file  Occupational History   Occupation: Retired  Tobacco Use   Smoking status: Former    Types: Cigarettes    Quit date: 07/06/2009    Years since quitting: 13.2   Smokeless tobacco: Never   Tobacco comments:    didnt smoke much  Vaping Use   Vaping Use: Never used  Substance and Sexual Activity   Alcohol use: No    Alcohol/week: 0.0 standard drinks of alcohol    Comment: very infrequently   Drug use: No   Sexual activity: Not on file  Other Topics Concern   Not on file  Social History Narrative   Pt recently moved to [place].    Previously worked as a Teacher, music.   Caffeine use: only when eating out tea/soda   Right-handed   Lives at home with partner/roommate   Social Determinants of Health   Financial Resource Strain: Not on file  Food Insecurity: Not on file  Transportation Needs: Not on file  Physical Activity: Not on file  Stress: Not on file  Social Connections: Not on file  Intimate Partner Violence: Not on file      PHYSICAL EXAM  There were no vitals  filed for this visit.    There is no height or weight on file to calculate BMI.  Generalized: Well developed, in no acute distress  Cardiology: normal rate and rhythm, no murmur noted Respiratory: clear to auscultation bilaterally  Neurological examination  Mentation: Alert oriented to time, place, history taking. Follows all commands  speech and language fluent Cranial nerve II-XII: Pupils were equal round reactive to light. Extraocular movements were full, visual field were full on confrontational test. Facial sensation and strength were normal. Uvula tongue midline. Head turning and shoulder shrug  were normal and symmetric. Motor: The motor testing reveals 5 over 5 strength of bilateral upper extremities. 4+ of 5 of the left lower extremity, 4/5 of right lower extremity. Good symmetric motor tone is noted throughout. Head tremor noted, worse when talking in exam room.  Gait and station: Uses both arms to push to a standing position. Gait is short but stable with Rolator. Tandem gait not assessed today.    DIAGNOSTIC DATA (LABS, IMAGING, TESTING) - I reviewed patient records, labs, notes, testing and imaging myself where available.      No data to display           Lab Results  Component Value Date   WBC 10.3 04/28/2022   HGB 12.4 04/28/2022   HCT 36.6 04/28/2022   MCV 95.8 04/28/2022   PLT 204 04/28/2022      Component Value Date/Time   NA 140 04/28/2022 1736   K 4.1 04/28/2022 1736   CL 102 04/28/2022 1736   CO2 25 04/28/2022 1736   GLUCOSE 168 (H) 04/28/2022 1736   BUN 21 04/28/2022 1736   CREATININE 1.18 (H) 04/28/2022 1736   CREATININE 0.74 10/13/2015 1053   CALCIUM 9.4 04/28/2022 1736   PROT 6.9 04/28/2022 1736   PROT 7.2 06/14/2016 1145   ALBUMIN 3.8 04/28/2022 1736   AST 56 (H) 04/28/2022 1736   ALT 36 04/28/2022 1736   ALKPHOS 80 04/28/2022 1736   BILITOT 0.7 04/28/2022 1736   GFRNONAA 48 (L) 04/28/2022 1736   GFRAA >60 08/27/2015 0347   No results found for: "CHOL", "HDL", "LDLCALC", "LDLDIRECT", "TRIG", "CHOLHDL" No results found for: "HGBA1C" Lab Results  Component Value Date   VITAMINB12 648 06/14/2016   Lab Results  Component Value Date   TSH 1.470 06/14/2016    ASSESSMENT AND PLAN 76 y.o. year old female  has a past medical history of Arthritis, Blood transfusion without  reported diagnosis, CAD (coronary artery disease) (08/15/13), Diabetes mellitus without complication ([place]), Diverticulosis, Essential tremor, GERD (gastroesophageal reflux disease), History of long-term use of multiple prescription drugs, Hyperlipidemia, Hypertension, Long-term (current) use of anticoagulants, PAD (peripheral artery disease) ([place]), Percutaneous transluminal coronary angioplasty status, Permanent atrial fibrillation ([place]), Pulmonary embolism ([place]), Pulmonary fibrosis ([place]), Seizure ([place]) (2014), Sick sinus syndrome ([place]), Spinal stenosis, Stroke ([place]), Subarachnoid hemorrhage ([place]), and Transient ischemic attack. here with   No diagnosis found.    Jaylon is doing fairly well today. She continues ASV therapy without any significant difficulty. Compliance report shows excellent compliance. She will continue close follow-up with primary care and cardiology for management of co morbidities and stroke prevention. She will continue healthy lifestyle habits with well-balanced diet and regular exercise. She will follow-up with [place] in 1 year, sooner if needed. She verbalizes understanding and agreement with this plan.   No orders of the defined types were placed in this encounter.     No orders of the defined types were placed in this encounter.  Debbora Presto, FNP-C 09/26/2022, 3:51 PM [facility] [address] [place], [place] [address] [phone]

## 2022-09-26 NOTE — Patient Instructions (Incomplete)
Please continue using your ASV regularly. While your insurance requires that you use ASV at least 4 hours each night on 70% of the nights, I recommend, that you not skip any nights and use it throughout the night if you can. Getting used to ASV and staying with the treatment long term does take time and patience and discipline. Untreated obstructive sleep apnea when it is moderate to severe can have an adverse impact on cardiovascular health and raise her risk for heart disease, arrhythmias, hypertension, congestive heart failure, stroke and diabetes. Untreated obstructive sleep apnea causes sleep disruption, nonrestorative sleep, and sleep deprivation. This can have an impact on your day to day functioning and cause daytime sleepiness and impairment of cognitive function, memory loss, mood disturbance, and problems focussing. Using ASV regularly can improve these symptoms.   Please contact [facility] for mask refitting. [phone]  Follow up in

## 2022-09-27 ENCOUNTER — Ambulatory Visit (INDEPENDENT_AMBULATORY_CARE_PROVIDER_SITE_OTHER): Payer: Medicare Other | Admitting: Family Medicine

## 2022-09-27 ENCOUNTER — Encounter: Payer: Self-pay | Admitting: Family Medicine

## 2022-09-27 VITALS — BP 143/56 | HR 64 | Ht 65.0 in | Wt 227.5 lb

## 2022-09-27 DIAGNOSIS — Z7189 Other specified counseling: Secondary | ICD-10-CM | POA: Diagnosis not present

## 2022-09-27 DIAGNOSIS — G4731 Primary central sleep apnea: Secondary | ICD-10-CM

## 2022-10-04 DIAGNOSIS — E559 Vitamin D deficiency, unspecified: Secondary | ICD-10-CM | POA: Diagnosis not present

## 2022-10-04 DIAGNOSIS — E119 Type 2 diabetes mellitus without complications: Secondary | ICD-10-CM | POA: Diagnosis not present

## 2022-10-04 DIAGNOSIS — E1165 Type 2 diabetes mellitus with hyperglycemia: Secondary | ICD-10-CM | POA: Diagnosis not present

## 2022-10-04 DIAGNOSIS — I10 Essential (primary) hypertension: Secondary | ICD-10-CM | POA: Diagnosis not present

## 2022-10-09 DIAGNOSIS — E559 Vitamin D deficiency, unspecified: Secondary | ICD-10-CM | POA: Diagnosis not present

## 2022-10-09 DIAGNOSIS — I10 Essential (primary) hypertension: Secondary | ICD-10-CM | POA: Diagnosis not present

## 2022-10-09 DIAGNOSIS — E1165 Type 2 diabetes mellitus with hyperglycemia: Secondary | ICD-10-CM | POA: Diagnosis not present

## 2022-10-24 ENCOUNTER — Ambulatory Visit (INDEPENDENT_AMBULATORY_CARE_PROVIDER_SITE_OTHER): Payer: Medicare Other

## 2022-10-24 DIAGNOSIS — I4819 Other persistent atrial fibrillation: Secondary | ICD-10-CM

## 2022-10-24 LAB — CUP PACEART REMOTE DEVICE CHECK
Battery Remaining Longevity: 10 mo
Battery Remaining Percentage: 14 %
Brady Statistic RA Percent Paced: 0 %
Brady Statistic RV Percent Paced: 83 %
Date Time Interrogation Session: 20240102043300
Implantable Pulse Generator Implant Date: 20160513
Lead Channel Impedance Value: 365 Ohm
Lead Channel Impedance Value: 625 Ohm
Lead Channel Pacing Threshold Amplitude: 1.6 V
Lead Channel Pacing Threshold Pulse Width: 0.4 ms
Lead Channel Setting Pacing Amplitude: 3.5 V
Lead Channel Setting Pacing Pulse Width: 0.4 ms
Lead Channel Setting Sensing Sensitivity: 0.6 mV
Pulse Gen Serial Number: 732886
Zone Setting Status: 755011

## 2022-11-21 DIAGNOSIS — E1165 Type 2 diabetes mellitus with hyperglycemia: Secondary | ICD-10-CM | POA: Diagnosis not present

## 2022-11-21 DIAGNOSIS — I10 Essential (primary) hypertension: Secondary | ICD-10-CM | POA: Diagnosis not present

## 2022-11-24 NOTE — Progress Notes (Signed)
Remote pacemaker transmission.   

## 2022-11-29 ENCOUNTER — Telehealth: Payer: Self-pay | Admitting: Family Medicine

## 2022-11-29 NOTE — Telephone Encounter (Signed)
Can you guys call her to let her know that I have reviewed the most recent compliance report over past 90 and past 30 days. Her leak in November and December looked great but something has changed just after christmas and not there is a very large leak. Did she get a new mask? If so, we may need the DME to check the fit. Her apneic events are a little better but still not at goal. Could we get her scheduled with Dr Roxy Horseman for a second opinion regarding need for pressure adjustments? TY!

## 2022-12-20 DIAGNOSIS — E1165 Type 2 diabetes mellitus with hyperglycemia: Secondary | ICD-10-CM | POA: Diagnosis not present

## 2022-12-20 DIAGNOSIS — I10 Essential (primary) hypertension: Secondary | ICD-10-CM | POA: Diagnosis not present

## 2022-12-26 ENCOUNTER — Ambulatory Visit (INDEPENDENT_AMBULATORY_CARE_PROVIDER_SITE_OTHER): Payer: Medicare Other | Admitting: Neurology

## 2022-12-26 ENCOUNTER — Encounter: Payer: Self-pay | Admitting: Neurology

## 2022-12-26 VITALS — BP 131/59 | HR 64 | Ht 66.0 in | Wt 226.0 lb

## 2022-12-26 DIAGNOSIS — G4731 Primary central sleep apnea: Secondary | ICD-10-CM

## 2022-12-26 DIAGNOSIS — Z7189 Other specified counseling: Secondary | ICD-10-CM | POA: Diagnosis not present

## 2022-12-26 DIAGNOSIS — I4811 Longstanding persistent atrial fibrillation: Secondary | ICD-10-CM

## 2022-12-26 NOTE — Progress Notes (Signed)
Provider:  Larey Seat, MD , Sleep Medicine.  Primary Care Physician:  [name], MD [address] [place] [address]     Referring Provider:Dr Jaynee Eagles, MD  NP Amy Lomax asked me to see this patient today .           Chief Complaint according to patient   Patient presents with:     New Patient (Initial Visit)      In December 23 this patient on CPAP reported a large air leak and was referred to [facility] for a new fitting. She reports she got a smaller FFM and this works better and doesn't leave pressure marks.  Mrs. Picklesimer had been referred to me by Dr. Jaynee Eagles, her primary care physician is Dr. Rachell Cipro, but it was her cardiologist Dr. Rayann Heman who follows her for atrial fibrillation that recommended a sleep study.  The patient is status post watchman procedure and pacemaker implantation. CAD and PE history.        HISTORY OF PRESENT ILLNESS:  The patient was seen in a sleep consult for Dr Jaynee Eagles on 01-08-2018. She is followed now by Amy Lomax. . Kamore Sandoe is a 77 y.o. female patient with [place] ancestry, who is here for revisit 12/26/2022 for an air leak on her CPAP which is now controlled.  Problem is resolved ? The patient did not bring machine or mask with her. Her residual AHI is too high - we can address this by retitration.   This patient with essential tremor , status post SAH in 2011. CAD , PE and atrial fib.  She walks with a walker.  Her sleep apnea diagnosis was established  in 02-08-2018, and titration was in 03-18-2018 - complex sleep apnea syndrome. She was titrated to ASV ( see below) after CPAP and BiPAP failed. She still spends 9 hours in bed each night but sleeps about 6-7 hours, between midnight and 7 AM.  Mrs. Zivkovic continues to use is 100% compliance an ASV machine that was issued in late summer 2019 and will be 77 years old later this year.  The average usage is 7 hours and 16 minutes compliance is 100%, her minimum expiratory pressure is  5 maximum expiratory pressure is 5 her minimum pressure support is 3 and her maximum pressure support is 15 cmH2O the residual AHI over 30 days is 14.1/h which is a little bit too high.  Air leakage now is controlled to the 95th percentile air leak is 7.6 L / minute since she changed back to a small size [employer] Simplus full facemask.     09-27-2022: A L; in November she had an AHI of 9.1 /h over 90 days. 92% compliance. Yara returns for follow up for complex slep apnea on ASV therapy. She was last seen 03/2022 and doing well. Annual follow up advised. Since, AirFit N20 size M. She reports over the past 3-4 months, she has noted an air leak. Air blows on her face throughout the night. She is adjusting mask multiple times throughout the night. Her partner reports that she seems to be snoring more. Apneic events have been elevated, previously well managed. She continues to note benefit of using ASV. She sleeps well. Wakes feeling refreshed. She always sleeps in supine position. Can not sleep on side. 90 day  air leak was 4.8 l/min. ( Unfortunatly, I cannot find airleak graph CD )      06-20-2018 [facility]  Edquist is a 77 y.o. female patient, seen here I her first RV after a new sleep study from 02-08-2018, HISTORY:  This female 77 year old patient of Dr. Cathren Laine with extensive list of diagnoses: (Stroke, CAD, pacemaker for bradycardia, essential tremor, atrial fib, watchman procedure and peripheral vascular disease endorsed a high degree of EDS) returns to [place] [facility] for PAP titration following a SPLIT night study performed on 02/08/2018, which resulted in central/ complex sleep apnea at an AHI of 31.6/h and SpO2 nadir of 90%.This apnea did not respond to CPAP, but SPLIT night did not allow time enough for a complete titration to BiPAP.  The patient endorsed the Epworth Sleepiness Scale at 13/24 points and the Fatigue Score at 17 points.   The patient's weight 205 pounds with a height of 66  (inches), resulting in a BMI of 33.1 kg/m2. The patient was fitted with a F&P Simplus small FF Mask.   DIAGNOSIS Treatment emerging Central Sleep Apnea - not responding to CPAP and BiPAP but to ASV. No findings of associate hypoxemia, PLMs, but frequent arousals spontaneously. This indicates some need to get used to the device.      PLANS/RECOMMENDATIONS: The patient's apnea responded well to ASV, but her overall sleep efficiency is still reduced. There are frequent spontaneous arousals.  I will order the recommended machine and pressure settings, and will offer the patient a sleep aid for the first month of use.    A [employer] Simplus small size FFM was used. Will order ASV machine with settings of - 5/15/3 cmH20, meaning EPAP at 5 cm , Maximum pressure support at 15 cm, and Minimum pressure support at 3 cm water.      Mrs. Smidt had been referred to me by Dr. Jaynee Eagles, her primary care physician is Dr. Rachell Cipro, but it was her cardiologist Dr. Rayann Heman who follows her for atrial fibrillation that recommended a sleep study.  The patient is status post watchman procedure and pacemaker implantation.   Her sleep study revealed severe COMPLEX sleep apnea at an AHI of 31.6/h;  during REM sleep the AHI was not increased, but there was a significant supine dependent sleep with an AHI of 34.4 versus a  nonsupine AHI of 13.7.  She did not have prolonged oxygen desaturations, but her sleep efficiency was rather poor she slept less than 60% of the recorded time.  There were at  baseline12 obstructive apneas, 3 central apneas and 14 mixed apneas.  The diagnosis was complex sleep apnea she was titrated to CPAP but central apneas emerged, she was then switched to a BiPAP function the sleep latency was only helpful minute once BiPAP was initiated and to sleep efficiency rose to 91.5%.  However they were now 39 central apneas, and 40 mixed apneas which only 20 obstructive apneas.   I stated that the  positive airway pressure titration was incomplete. Mrs. Alpert returned on 18 Mar 2018 for a full night titration.  BiPAP was initiated at 10/6 centimeters water pressure and she switched to BiPAP ST.  She was then changed to ASV mode with a minimum pressure of 3, maximum pressure support of 15 and an end expiratory pressure of 5 cmH2O.  Here the AHI was reduced at 0.5/h.  She also used a small full facemask.  She states that she is still using a fissure and pico Simplus full facemask in small size.   I am able to review the first compliance download for this  new patient on CPAP-ASV.  She is 100% compliant has used her machine 30 out of 30 days and only 3 of those days less than 4 hours, the still is a very high compliance by time over 90%, average use of time is 6 hours and 21 minutes at night, the patient uses a minimum EPAP of 5 and a minimum pressure support of 3 and a maximum pressure support of 15 cmH2O.  Her apnea index is 2.4/h hypercapnia Cecila 3.7/h, she does not have major air leaks based on these data I think this is the optimal treatment for her.        01-10-2018, seen here in a referral from Dr. Jaynee Eagles, whose note I have copied below. 01-10-2018 .  Chief complaint according to patient : "The fall asleep a lot while watching TV in daytime, and at nighttime I may have hours before I can go to sleep" . She used to love going to the cinema but now has stopped because she " sleeps through "   Sleep habits are as follows: The patient spends her evening hours watching TV.  She does have trouble with ambulation and is using a walker that is seated, at home she will watch TV while seated in her favorite recliner, she estimates that she will fall asleep for a total time of about an hour while in front of the TV between 7 and 10 PM.  She will retreat to get ready for bed at about 1130 or midnight, probably late.  While in bed she has often trouble initiating sleep.  Her bedroom is described as cool, quiet  and dark.  She is attached to a heart monitor at night but admits some blue light, and she does use a night light.  Once asleep she can stay asleep on average for 3 hours before she wakes up.  She does not know why she wakes up, there is no specific discomfort she does not usually have to go to the bathroom sometimes she has noted a dry mouth but this did not wake her.,  Her partner is using a CPAP and is usually asleep and has not witnessed her to have apnea or snoring.  However she has heard before that she is a snorer, and this has been known for many years. Her husband use to wake her when she stopped breathing - 15 years ago.  She rises at varius times, is retired, has no routine. She goes to water gymnastics or water aerobics twice a week when she has to rise earlier, around 6.30 AM. The exercise begins at 9 AM.  All other days she stays longer in bed and sometimes until noon. She spends a lot of time in bed without sleeping , averaging still 9-10 hours. Review of Systems: Out of a complete 14 system review, the patient complains of only the following symptoms, and all other reviewed systems are negative.:  Fatigue, sleepiness , snoring,  fragmented sleep, Insomnia, essential tremor, gait disorder.   How likely are you to doze in the following situations: 0 = not likely, 1 = slight chance, 2 = moderate chance, 3 = high chance   Sitting and Reading? Watching Television? Sitting inactive in a public place (theater or meeting)? As a passenger in a car for an hour without a break? Lying down in the afternoon when circumstances permit? Sitting and talking to someone? Sitting quietly after lunch without alcohol? In a car, while stopped for a few minutes in traffic?  Total = 4/ 24 points   FSS endorsed at 8/ 63 points.  GDS: 3/ 15   Social History   Socioeconomic History   Marital status: Significant Other    Spouse name: Not on file   Number of children: 5   Years of education: 49    Highest education level: Not on file  Occupational History   Occupation: Retired  Tobacco Use   Smoking status: Former    Types: Cigarettes    Quit date: 07/06/2009    Years since quitting: 13.4   Smokeless tobacco: Never   Tobacco comments:    didnt smoke much  Vaping Use   Vaping Use: Never used  Substance and Sexual Activity   Alcohol use: No    Alcohol/week: 0.0 standard drinks of alcohol    Comment: very infrequently   Drug use: No   Sexual activity: Not on file  Other Topics Concern   Not on file  Social History Narrative   Pt and her partner moved to [place], [place] ,[place].    Previously worked as a Teacher, music.   Caffeine use: only when eating out tea/soda   Right-handed   Lives at home with partner/roommate   Social Determinants of Health   Financial Resource Strain: Not on file  Food Insecurity: Not on file  Transportation Needs: Not on file  Physical Activity: Not on file  Stress: Not on file  Social Connections: Not on file    Family History  Problem Relation Age of Onset   Stroke Mother    Epilepsy Mother    Stroke Father    Heart attack Father    Atrial fibrillation Sister    Atrial fibrillation Brother    Diabetes Mellitus II Brother    Pulmonary embolism Son    Colon cancer Neg Hx    Esophageal cancer Neg Hx    Stomach cancer Neg Hx    Rectal cancer Neg Hx    Colon polyps Neg Hx     Past Medical History:  Diagnosis Date   Arthritis    joints   Blood transfusion without reported diagnosis    "when oldest daughter was born"   CAD (coronary artery disease) 08/15/13   Diabetes mellitus without complication ([place])    Diverticulosis    Essential tremor    GERD (gastroesophageal reflux disease)    History of long-term use of multiple prescription drugs    Hyperlipidemia    mixed   Hypertension    Long-term (current) use of anticoagulants    PAD (peripheral artery disease) (HCC)    Percutaneous transluminal coronary  angioplasty status    Permanent atrial fibrillation (HCC)    chads2vasc score is at least 5   Pulmonary embolism ([place])    Pulmonary fibrosis ([place])    Seizure ([place]) 2014   occured in setting of ICH   Sick sinus syndrome ([place])    Spinal stenosis    Stroke ([place])    Subarachnoid hemorrhage ([place])    Transient ischemic attack     Past Surgical History:  Procedure Laterality Date   ABDOMINAL HYSTERECTOMY  2004   BASAL CELL CARCINOMA EXCISION     CARDIAC CATHETERIZATION  10/14   PCI    carpel tunnel surgery Bilateral 2006/ 2010   CESAREAN SECTION N/A 1978   x 1   COLONOSCOPY     HERNIA REPAIR  06/2010   LEFT ATRIAL APPENDAGE OCCLUSION N/A 08/26/2015   Procedure: LEFT ATRIAL APPENDAGE OCCLUSION;  Surgeon:  Thompson Grayer, MD;  Location: [facility];  Service: Cardiovascular;  Laterality: N/A;   PACEMAKER INSERTION  03/05/15   [name] Accolade DR implanted in [place]   POLYPECTOMY     removal of big toe nail Bilateral    TEE WITHOUT CARDIOVERSION N/A 08/16/2015   Procedure: TRANSESOPHAGEAL ECHOCARDIOGRAM (TEE);  Surgeon: Pixie Casino, MD;  Location: [facility] ENDOSCOPY;  Service: Cardiovascular;  Laterality: N/A;   TEE WITHOUT CARDIOVERSION N/A 10/12/2015   Procedure: TRANSESOPHAGEAL ECHOCARDIOGRAM (TEE);  Surgeon: Pixie Casino, MD;  Location: [facility] ENDOSCOPY;  Service: Cardiovascular;  Laterality: N/A;   titanium pins inserted metatarsol bone  02/2012   2 pins placed      Current Outpatient Medications on File Prior to Visit  Medication Sig Dispense Refill   aspirin EC 81 MG tablet Take 1 tablet (81 mg total) by mouth daily. Swallow whole. 30 tablet 11   benazepril (LOTENSIN) 20 MG tablet Take 20 mg by mouth daily.     cetirizine (ZYRTEC) 10 MG tablet Take 1 tablet by mouth daily.     Cholecalciferol (VITAMIN D3) 75 MCG (3000 UT) TABS Take 1 tablet by mouth daily.     hydrochlorothiazide (HYDRODIURIL) 25 MG tablet Take 25 mg by mouth daily.      LEVEMIR FLEXTOUCH  100 UNIT/ML Pen      lovastatin (MEVACOR) 40 MG tablet Take 40 mg by mouth at bedtime.     Multiple Vitamin (MULTIVITAMIN) tablet Take 1 tablet by mouth daily.     nitroGLYCERIN (NITROSTAT) 0.4 MG SL tablet Place 1 tablet (0.4 mg total) under the tongue every 5 (five) minutes as needed for chest pain. 25 tablet 1   ondansetron (ZOFRAN) 4 MG tablet Take 4 mg by mouth every 8 (eight) hours as needed for nausea or vomiting.     Probiotic Product (PROBIOTIC PO) Take 1 capsule by mouth daily.     No current facility-administered medications on file prior to visit.    Allergies  Allergen Reactions   Hydrocodone Nausea And Vomiting    Other reaction(s): other   Sulfa Antibiotics     RASH     DIAGNOSTIC DATA (LABS, IMAGING, TESTING) - I reviewed patient records, labs, notes, testing and imaging myself where available.  Lab Results  Component Value Date   WBC 10.3 04/28/2022   HGB 12.4 04/28/2022   HCT 36.6 04/28/2022   MCV 95.8 04/28/2022   PLT 204 04/28/2022      Component Value Date/Time   NA 140 04/28/2022 1736   K 4.1 04/28/2022 1736   CL 102 04/28/2022 1736   CO2 25 04/28/2022 1736   GLUCOSE 168 (H) 04/28/2022 1736   BUN 21 04/28/2022 1736   CREATININE 1.18 (H) 04/28/2022 1736   CREATININE 0.74 10/13/2015 1053   CALCIUM 9.4 04/28/2022 1736   PROT 6.9 04/28/2022 1736   PROT 7.2 06/14/2016 1145   ALBUMIN 3.8 04/28/2022 1736   AST 56 (H) 04/28/2022 1736   ALT 36 04/28/2022 1736   ALKPHOS 80 04/28/2022 1736   BILITOT 0.7 04/28/2022 1736   GFRNONAA 48 (L) 04/28/2022 1736   GFRAA >60 08/27/2015 0347   No results found for: "CHOL", "HDL", "LDLCALC", "LDLDIRECT", "TRIG", "CHOLHDL" No results found for: "HGBA1C" Lab Results  Component Value Date   VITAMINB12 648 06/14/2016   Lab Results  Component Value Date   TSH 1.470 06/14/2016    PHYSICAL EXAM:  Today's Vitals   12/26/22 0815  BP: (!) 131/59  Pulse: 64  Weight: 226 lb (102.5 kg)  Height: '5\' 6"'$  (1.676 m)    Body mass index is 36.48 kg/m.   Wt Readings from Last 3 Encounters:  12/26/22 226 lb (102.5 kg)  09/27/22 227 lb 8 oz (103.2 kg)  05/15/22 230 lb (104.3 kg)     Ht Readings from Last 3 Encounters:  12/26/22 '5\' 6"'$  (1.676 m)  09/27/22 '5\' 5"'$  (1.651 m)  05/15/22 '5\' 6"'$  (1.676 m)      General: The patient is awake, alert and appears not in acute distress. The patient is well groomed. Head: Normocephalic, atraumatic. Neck is supple. ysical exam:   General: The patient is awake, alert and appears not in acute distress. The patient is well groomed. Head: Normocephalic, atraumatic. Neck is supple. Mallampati 5 - not able to see uvula. Pale mucosa. ,  neck circumference: 15.75. Nasal airflow patent ,Retrognathia is not seen. Jaw tremor. Titubation, tongue tremor.    Cardiovascular:  Regular rate and rhythm , without  murmurs or carotid bruit, and without distended neck veins. Respiratory: Lungs are clear to auscultation. Skin:  Without evidence of edema, or rash Trunk: BMI is 33. The patient has a truncal tremor- Neurologic exam : The patient is  alert, oriented to place and time.   Memory subjective  described as intact.   Attention span & concentration ability appears normal.  Speech is stammering, non-fluent, with mild dysphonia .  Mood and affect are appropriate.   Cranial nerves: Pupils are equal and briskly reactive to light. Funduscopic exam without evidence of pallor or edema. Early cataracts.  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 Dry eyes. Extraocular movements  in vertical and horizontal planes intact and without nystagmus. Visual fields deferred.  Facial sensation intact to fine touch.Facial motor strength is symmetric and tongue and uvula move midline. Titubation. Shoulder shrug was asymmetrical. Left shoulder droops.    Motor exam:  Right sided weakness after SAH- increased tone, brisk reflexes, coordination in right dominant hand  impaired.,tremor is essential - or basal ganglion related. Sensory:  Fine touch, pinprick and vibration were decreased in right hand.  Coordination: right Finger-to-nose maneuver with evidence of ataxia, dysmetria and  tremor.   Gait and station: deferred. Patient is using a walker.       ASSESSMENT AND PLAN: 77  year old caucasian female here with:    1) COMPLEX and CENTRAL apnea - her residual AHI on ASV is too high, in the last 30 days over 10/h-  with high day by day variability. Mask seems to fit.  Machine will be 77 years old in July 24.   2) I would like to re-invite her for an in lab test and titration, SPLIT at  AHI 10/h, arrival at 8 PM, advancing to BiPAP or ASV fast.   [employer] FFM Simplus small is her preference.   3)  re titration study for June 2024, until then use the current ASV machine .    I plan to follow up either personally or through our NP within 6-8 months.   I would like to thank Dr Jaynee Eagles  and [name], Md [address] [place],  [place] [address] for allowing me to meet with and to take care of this pleasant patient.   CC: I will share my notes with Dr Jaynee Eagles .  After spending a total time of  20  minutes face to face and additional time for physical  and neurologic examination, review of laboratory studies,  personal review of imaging studies, reports and results of other testing and review of referral information / records as far as provided in visit,   Electronically signed by: Larey Seat, MD 12/26/2022 8:27 AM  [facility] Board certified by The [employer] of Sleep Medicine and [place] of the [employer] of Sleep Medicine. Board certified In Neurology through the [place], Fellow of the [employer] of Neurology. Medical Director of [employer] [facility].

## 2022-12-26 NOTE — Patient Instructions (Signed)
COMPLEX and CENTRAL apnea - her residual AHI on ASV is too high, in the last 30 days over 10/h-  with high day by day variability. Mask seems to fit.  Machine will be 77 years old in July.   2) I would like to re-invite her for an in lab test and titration, SPLIT at  AHI 10/h, arrival at 8 PM, advancing to BiPAP or ASV fast.   [employer] FFM Simplus small is her preference.   3)  re titration study for June 2024, until then use the current ASV machine .

## 2023-01-23 ENCOUNTER — Ambulatory Visit (INDEPENDENT_AMBULATORY_CARE_PROVIDER_SITE_OTHER): Payer: Medicare Other

## 2023-01-23 DIAGNOSIS — I4819 Other persistent atrial fibrillation: Secondary | ICD-10-CM

## 2023-01-23 DIAGNOSIS — I219 Acute myocardial infarction, unspecified: Secondary | ICD-10-CM | POA: Diagnosis not present

## 2023-01-23 LAB — CUP PACEART REMOTE DEVICE CHECK
Battery Remaining Longevity: 12 mo
Battery Remaining Percentage: 18 %
Brady Statistic RA Percent Paced: 0 %
Brady Statistic RV Percent Paced: 85 %
Date Time Interrogation Session: 20240402043200
Implantable Pulse Generator Implant Date: 20160513
Lead Channel Impedance Value: 355 Ohm
Lead Channel Impedance Value: 605 Ohm
Lead Channel Pacing Threshold Amplitude: 1.3 V
Lead Channel Pacing Threshold Pulse Width: 0.4 ms
Lead Channel Setting Pacing Amplitude: 3.5 V
Lead Channel Setting Pacing Pulse Width: 0.4 ms
Lead Channel Setting Sensing Sensitivity: 0.6 mV
Pulse Gen Serial Number: 732886
Zone Setting Status: 755011

## 2023-02-06 DIAGNOSIS — E1165 Type 2 diabetes mellitus with hyperglycemia: Secondary | ICD-10-CM | POA: Diagnosis not present

## 2023-02-06 DIAGNOSIS — E782 Mixed hyperlipidemia: Secondary | ICD-10-CM | POA: Diagnosis not present

## 2023-02-06 DIAGNOSIS — E559 Vitamin D deficiency, unspecified: Secondary | ICD-10-CM | POA: Diagnosis not present

## 2023-02-14 ENCOUNTER — Telehealth: Payer: Self-pay | Admitting: Neurology

## 2023-02-14 NOTE — Telephone Encounter (Signed)
02/13/23 left VM [place] 12/26/22 Medicare/aarp no auth req EE

## 2023-02-16 DIAGNOSIS — R110 Nausea: Secondary | ICD-10-CM | POA: Diagnosis not present

## 2023-02-16 DIAGNOSIS — T753XXA Motion sickness, initial encounter: Secondary | ICD-10-CM | POA: Diagnosis not present

## 2023-03-05 DIAGNOSIS — I10 Essential (primary) hypertension: Secondary | ICD-10-CM | POA: Diagnosis not present

## 2023-03-05 DIAGNOSIS — E1165 Type 2 diabetes mellitus with hyperglycemia: Secondary | ICD-10-CM | POA: Diagnosis not present

## 2023-03-05 DIAGNOSIS — E559 Vitamin D deficiency, unspecified: Secondary | ICD-10-CM | POA: Diagnosis not present

## 2023-03-05 DIAGNOSIS — Z23 Encounter for immunization: Secondary | ICD-10-CM | POA: Diagnosis not present

## 2023-03-05 DIAGNOSIS — E782 Mixed hyperlipidemia: Secondary | ICD-10-CM | POA: Diagnosis not present

## 2023-03-06 NOTE — Progress Notes (Signed)
Remote pacemaker transmission.   

## 2023-04-09 DIAGNOSIS — I10 Essential (primary) hypertension: Secondary | ICD-10-CM | POA: Diagnosis not present

## 2023-04-09 DIAGNOSIS — E1165 Type 2 diabetes mellitus with hyperglycemia: Secondary | ICD-10-CM | POA: Diagnosis not present

## 2023-04-16 DIAGNOSIS — E785 Hyperlipidemia, unspecified: Secondary | ICD-10-CM | POA: Diagnosis not present

## 2023-04-16 DIAGNOSIS — Z6836 Body mass index (BMI) 36.0-36.9, adult: Secondary | ICD-10-CM | POA: Diagnosis not present

## 2023-04-16 DIAGNOSIS — I10 Essential (primary) hypertension: Secondary | ICD-10-CM | POA: Diagnosis not present

## 2023-04-16 DIAGNOSIS — Z0000 Encounter for general adult medical examination without abnormal findings: Secondary | ICD-10-CM | POA: Diagnosis not present

## 2023-04-16 DIAGNOSIS — E119 Type 2 diabetes mellitus without complications: Secondary | ICD-10-CM | POA: Diagnosis not present

## 2023-04-16 NOTE — Patient Instructions (Incomplete)
Please continue using your ASV regularly. While your insurance requires that you use ASV at least 4 hours each night on 70% of the nights, I recommend, that you not skip any nights and use it throughout the night if you can. Getting used to ASV and staying with the treatment long term does take time and patience and discipline. Untreated obstructive sleep apnea when it is moderate to severe can have an adverse impact on cardiovascular health and raise her risk for heart disease, arrhythmias, hypertension, congestive heart failure, stroke and diabetes. Untreated obstructive sleep apnea causes sleep disruption, nonrestorative sleep, and sleep deprivation. This can have an impact on your day to day functioning and cause daytime sleepiness and impairment of cognitive function, memory loss, mood disturbance, and problems focussing. Using ASV regularly can improve these symptoms.  We will update supply orders, today. I will place orders for a sleep study to evaluate pressure settings and get you a new machine.   Follow up in 1 year

## 2023-04-16 NOTE — Progress Notes (Unsigned)
PATIENT: Heather May DOB: 08-Feb-1946  REASON FOR VISIT: follow up HISTORY FROM: patient  No chief complaint on file.   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 She was seen by Dr Vickey Huger 12/2022. She advised split night study 03/2023 as she [name] was eligible for new machine and AHI remained elevated at 10/hr.   09/27/2022 ALL: Heather May returns for follow up for complex slep apnea on ASV therapy. She was last seen 03/2022 and doing well. Annual follow up advised. Since, AirFit N20 size M. She reports over the past 3-4 months, she has noted an air leak. Air blows on her face throughout the night. She is adjusting mask multiple times throughout the night. Her partner reports that she seems to be snoring more. Apneic events have been elevated, previously well managed. She continues to note benefit of using ASV. She sleeps well. Wakes feeling refreshed. She always sleeps in supine position. Can not sleep on side.     04/19/2022 ALL: Heather May returns for follow up for complex sleep apnea on ASV. She is doing well. She is using therapy nightly for about 8 hours. She denies concerns with machine or supplies.   Compliance data from 03/17/2022-04/15/2022 shows she used ASV 29/30 nights. She used ASV greater than 4 hours 29/30 nights (97%). Average usage was 8 hours and 24 min. Residual AHI was 2.5 with min EPAP 5, max EPAP 5, Min PS 3 and max PS 15. No significant leak noted.   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 She continues to very well. She is using ASV nightly. She sleeps well. She denies seizure activity. She had Covid last month. She recovered well. She was fully vaccinated and booster. She is no longer taking Plavix. She has continued asa 81mg . Head tremor is stable. She continues to use Rolator.     02/05/2020 ALL:  Heather May is a 77 y.o. female here today for follow up for complex  sleep apnea on ASV therapy. She reports one episode where she felt as if she were smothering but this has not occurred recently. She is doing well with her machine and has all needed supplies. She does admit to not using ASV 1 night every week as she helps to care for her grandchildren at her daughter's home. She does note benefit of using ASV therapy.  Compliance report dated 11/06/2019 through 02/03/2020 reveals that she used ASV 80 of the past 90 days for compliance of 89%. She used ASV therapy greater than 4 hours 73 of the past 90 days for compliance of 81%. Average usage was 6 hours and 26 minutes. Residual AHI was 1.6 with minimum EPAP of 5 cm of water, minimum pressure support of 3 cm of water and maximum pressure support of 15 cm of water. There was no significant leak noted.  She discontinued levetiracetam last year. No seizure activity. She continues Plavix 75mg  daily in setting of previous CVA, CAD s/p cardiac stents and atril fib. She is followed closely by PCP for stroke prevention and management of co morbidities. She is also followed regularly by cardiology. She requested to stop Plavix at her last follow-up but was instructed to continue due to cardiac stents. She is exercising regularly. She participates with [employer] at her local [employer] 3 days a week. She stays well-hydrated. She does note chronic pain but feels that she is well managed.  She uses her Rollator for long distance walking.  HISTORY: (copied from my note on 12/20/2018)  Heather May is a 77 y.o. 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  Compliance report dated 11/16/2018 through 12/15/2018 reveals that she is using BiPAP 29 out of 30 days for compliance 97%.  She is using her BiPAP for greater than 4 hours 23 out of 30 days for compliance of 77%.  Average usage is 5 hours and 32 minutes.  AHI was 4.7.  There is no significant leak.  She reports that she has done well since mask refitting.  She does note  benefit from using BiPAP as she is less sleepy during the day.  She admits that she often goes to bed late and does not use the machine for greater than 4 hours.   She has been seen in the past for [facility] in 2014, seizure post SAH and lucunar infarct in 2019.  It was noted that Keppra should be discontinued however she has continued to take this medication.  She has not had any seizure activity and only one with SAH.[name] She was also diagnosed with Parkinson's around 2015 but was later told that she had an essential tremor.  She reports that she requested a second opinion after participating in a Parkinson's support group.  She did not feel that she had any symptoms related to Parkinson's with the exception of a head tremor.  Head tremor is worse when she is nervous or tired.  She has not noticed any changes with her gait or ability to move.  No concerns of memory loss this time.  REVIEW OF SYSTEMS: Out of a complete 14 system review of symptoms, the patient complains only of the following symptoms, chronic pain, fatigue and all other reviewed systems are negative.  ESS: 5/24  ALLERGIES: Allergies  Allergen Reactions   Hydrocodone Nausea And Vomiting    Other reaction(s): other   Sulfa Antibiotics     RASH    HOME MEDICATIONS: Outpatient Medications Prior to Visit  Medication Sig Dispense Refill   aspirin EC 81 MG tablet Take 1 tablet (81 mg total) by mouth daily. Swallow whole. 30 tablet 11   benazepril (LOTENSIN) 20 MG tablet Take 20 mg by mouth daily.     cetirizine (ZYRTEC) 10 MG tablet Take 1 tablet by mouth daily.     Cholecalciferol (VITAMIN D3) 75 MCG (3000 UT) TABS Take 1 tablet by mouth daily.     hydrochlorothiazide (HYDRODIURIL) 25 MG tablet Take 25 mg by mouth daily.      LEVEMIR FLEXTOUCH 100 UNIT/ML Pen      lovastatin (MEVACOR) 40 MG tablet Take 40 mg by mouth at bedtime.     Multiple Vitamin (MULTIVITAMIN) tablet Take 1 tablet by mouth daily.     nitroGLYCERIN (NITROSTAT) 0.4  MG SL tablet Place 1 tablet (0.4 mg total) under the tongue every 5 (five) minutes as needed for chest pain. 25 tablet 1   ondansetron (ZOFRAN) 4 MG tablet Take 4 mg by mouth every 8 (eight) hours as needed for nausea or vomiting.     Probiotic Product (PROBIOTIC PO) Take 1 capsule by mouth daily.     No facility-administered medications prior to visit.    PAST MEDICAL HISTORY: Past Medical History:  Diagnosis Date   Arthritis    joints   Blood transfusion without reported diagnosis    "when oldest daughter was born"   CAD (coronary artery disease) 08/15/13  Diabetes mellitus without complication (HCC)    Diverticulosis    Essential tremor    GERD (gastroesophageal reflux disease)    History of long-term use of multiple prescription drugs    Hyperlipidemia    mixed   Hypertension    Long-term (current) use of anticoagulants    PAD (peripheral artery disease) (HCC)    Percutaneous transluminal coronary angioplasty status    Permanent atrial fibrillation (HCC)    chads2vasc score is at least 5   Pulmonary embolism (HCC)    Pulmonary fibrosis (HCC)    Seizure (HCC) 2014   occured in setting of ICH   Sick sinus syndrome (HCC)    Spinal stenosis    Stroke (HCC)    Subarachnoid hemorrhage (HCC)    Transient ischemic attack     PAST SURGICAL HISTORY: Past Surgical History:  Procedure Laterality Date   ABDOMINAL HYSTERECTOMY  2004   BASAL CELL CARCINOMA EXCISION     CARDIAC CATHETERIZATION  10/14   PCI    carpel tunnel surgery Bilateral 2006/ 2010   CESAREAN SECTION N/A 1978   x 1   COLONOSCOPY     HERNIA REPAIR  06/2010   LEFT ATRIAL APPENDAGE OCCLUSION N/A 08/26/2015   Procedure: LEFT ATRIAL APPENDAGE OCCLUSION;  Surgeon: Hillis Range, MD;  Location: [facility] INVASIVE CV LAB;  Service: Cardiovascular;  Laterality: N/A;   PACEMAKER INSERTION  03/05/15   [name] Accolade DR implanted in [place]   POLYPECTOMY     removal of big toe nail Bilateral    TEE  WITHOUT CARDIOVERSION N/A 08/16/2015   Procedure: TRANSESOPHAGEAL ECHOCARDIOGRAM (TEE);  Surgeon: Chrystie Nose, MD;  [facility] ENDOSCOPY;  Service: Cardiovascular;  Laterality: N/A;   TEE WITHOUT CARDIOVERSION N/A 10/12/2015   Procedure: TRANSESOPHAGEAL ECHOCARDIOGRAM (TEE);  Surgeon: Chrystie Nose, MD;  Location: [facility] ENDOSCOPY;  Service: Cardiovascular;  Laterality: N/A;   titanium pins inserted metatarsol bone  02/2012   2 pins placed     FAMILY HISTORY: Family History  Problem Relation Age of Onset   Stroke Mother    Epilepsy Mother    Stroke Father    Heart attack Father    Atrial fibrillation Sister    Atrial fibrillation Brother    Diabetes Mellitus II Brother    Pulmonary embolism Son    Colon cancer Neg Hx    Esophageal cancer Neg Hx    Stomach cancer Neg Hx    Rectal cancer Neg Hx    Colon polyps Neg Hx     SOCIAL HISTORY: Social History   Socioeconomic History   Marital status: Significant Other    Spouse name: Not on file   Number of children: 5   Years of education: 12   Highest education level: Not on file  Occupational History   Occupation: Retired  Tobacco Use   Smoking status: Former    Types: Cigarettes    Quit date: 07/06/2009    Years since quitting: 13.7   Smokeless tobacco: Never   Tobacco comments:    didnt smoke much  Vaping Use   Vaping Use: Never used  Substance and Sexual Activity   Alcohol use: No    Alcohol/week: 0.0 standard drinks of alcohol    Comment: very infrequently   Drug use: No   Sexual activity: Not on file  Other Topics Concern   Not on file  Social History Narrative   [place] recently moved to [place].    Previously worked  as a TEFL teacher.   Caffeine use: only when eating out tea/soda   Right-handed   Lives at home with partner/roommate   Social Determinants of Health   Financial Resource Strain: Not on file  Food Insecurity: Not on file  Transportation Needs: Not on file  Physical  Activity: Not on file  Stress: Not on file  Social Connections: Not on file  Intimate Partner Violence: Not on file      PHYSICAL EXAM  There were no vitals filed for this visit.     There is no height or weight on file to calculate BMI.  Generalized: Well developed, in no acute distress  Cardiology: normal rate and rhythm, no murmur noted Respiratory: clear to auscultation bilaterally  Neurological examination  Mentation: Alert oriented to time, place, history taking. Follows all commands speech and language fluent Cranial nerve II-XII: Pupils were equal round reactive to light. Extraocular movements were full, visual field were full on confrontational test. Facial sensation and strength were normal. Uvula tongue midline. Head turning and shoulder shrug  were normal and symmetric. Motor: The motor testing reveals 5 over 5 strength of bilateral upper extremities. 4+ of 5 of the left lower extremity, 4/5 of right lower extremity. Good symmetric motor tone is noted throughout. Head tremor noted, worse when talking in exam room.  Gait and station: Uses both arms to push to a standing position. Gait is short but stable with Rolator. Tandem gait not assessed today.    DIAGNOSTIC DATA (LABS, IMAGING, TESTING) - I reviewed patient records, labs, notes, testing and imaging myself where available.      No data to display           Lab Results  Component Value Date   WBC 10.3 04/28/2022   HGB 12.4 04/28/2022   HCT 36.6 04/28/2022   MCV 95.8 04/28/2022   PLT 204 04/28/2022      Component Value Date/Time   NA 140 04/28/2022 1736   K 4.1 04/28/2022 1736   CL 102 04/28/2022 1736   CO2 25 04/28/2022 1736   GLUCOSE 168 (H) 04/28/2022 1736   BUN 21 04/28/2022 1736   CREATININE 1.18 (H) 04/28/2022 1736   CREATININE 0.74 10/13/2015 1053   CALCIUM 9.4 04/28/2022 1736   PROT 6.9 04/28/2022 1736   PROT 7.2 06/14/2016 1145   ALBUMIN 3.8 04/28/2022 1736   AST 56 (H) 04/28/2022 1736    ALT 36 04/28/2022 1736   ALKPHOS 80 04/28/2022 1736   BILITOT 0.7 04/28/2022 1736   GFRNONAA 48 (L) 04/28/2022 1736   GFRAA >60 08/27/2015 0347   No results found for: "CHOL", "HDL", "LDLCALC", "LDLDIRECT", "TRIG", "CHOLHDL" No results found for: "HGBA1C" Lab Results  Component Value Date   VITAMINB12 648 06/14/2016   Lab Results  Component Value Date   TSH 1.470 06/14/2016    ASSESSMENT AND PLAN 77 y.o. year old female  has a past medical history of Arthritis, Blood transfusion without reported diagnosis, CAD (coronary artery disease) (08/15/13), Diabetes mellitus without complication (HCC), Diverticulosis, Essential tremor, GERD (gastroesophageal reflux disease), History of long-term use of multiple prescription drugs, Hyperlipidemia, Hypertension, Long-term (current) use of anticoagulants, PAD (peripheral artery disease) (HCC), Percutaneous transluminal coronary angioplasty status, Permanent atrial fibrillation (HCC), Pulmonary embolism (HCC), Pulmonary fibrosis (HCC), Seizure (HCC) (2014), Sick sinus syndrome (HCC), Spinal stenosis, Stroke (HCC), Subarachnoid hemorrhage (HCC), and Transient ischemic attack. here with   No diagnosis found.   Shaindel is doing fairly well today. She continues ASV therapy  nightly. Compliance report shows excellent compliance. She has noted more difficulty wit elevated leak over the past 3-4 months. AHI now 14. Previously 2-4/hr. I will send her for a mask refitting. We will reassess download in 6-8 weeks. May consider titration if needed. I have encouraged her to avoid supine sleep position if she can. She will continue close follow-up with primary care and cardiology for management of co morbidities and stroke prevention. She will continue healthy lifestyle habits with well-balanced diet and regular exercise. She will follow-up with [place] in 1 year, sooner if needed. She verbalizes understanding and agreement with this plan.   No orders of the defined types  were placed in this encounter.     No orders of the defined types were placed in this encounter.      Shawnie Dapper, FNP-C 04/16/2023, 3:55 PM [facility] [address], [address] [place], [place] [address] [phone]

## 2023-04-18 ENCOUNTER — Ambulatory Visit (INDEPENDENT_AMBULATORY_CARE_PROVIDER_SITE_OTHER): Payer: Medicare Other | Admitting: Family Medicine

## 2023-04-18 ENCOUNTER — Encounter: Payer: Self-pay | Admitting: Family Medicine

## 2023-04-18 VITALS — BP 122/53 | HR 62 | Ht 66.0 in | Wt 227.0 lb

## 2023-04-18 DIAGNOSIS — G4731 Primary central sleep apnea: Secondary | ICD-10-CM

## 2023-04-18 NOTE — Telephone Encounter (Signed)
I called the patient but the patient did not pick up, I left her a voicemail informing her that I needed to r/s her appt do to our tech having a family emergency. I left my direct number.   I also sent her a Clinical cytogeneticist message.

## 2023-04-19 NOTE — Telephone Encounter (Signed)
Phone room: can you please call pt and help her r/s 08/02/23 appt that she is asking about? Thank you

## 2023-04-19 NOTE — Telephone Encounter (Signed)
Called pt. Rescheduled her appointment for 11/2 @ 8:30 am with Amy. Pt said thanks so much for the call.

## 2023-04-24 ENCOUNTER — Ambulatory Visit (INDEPENDENT_AMBULATORY_CARE_PROVIDER_SITE_OTHER): Payer: Medicare Other

## 2023-04-24 DIAGNOSIS — G459 Transient cerebral ischemic attack, unspecified: Secondary | ICD-10-CM

## 2023-04-24 LAB — CUP PACEART REMOTE DEVICE CHECK
Battery Remaining Longevity: 9 mo
Battery Remaining Percentage: 13 %
Brady Statistic RA Percent Paced: 0 %
Brady Statistic RV Percent Paced: 86 %
Date Time Interrogation Session: 20240702043200
Implantable Pulse Generator Implant Date: 20160513
Lead Channel Impedance Value: 345 Ohm
Lead Channel Impedance Value: 583 Ohm
Lead Channel Pacing Threshold Amplitude: 1.5 V
Lead Channel Pacing Threshold Pulse Width: 0.4 ms
Lead Channel Setting Pacing Amplitude: 3.5 V
Lead Channel Setting Pacing Pulse Width: 0.4 ms
Lead Channel Setting Sensing Sensitivity: 0.6 mV
Pulse Gen Serial Number: 732886
Zone Setting Status: 755011

## 2023-05-07 DIAGNOSIS — G4733 Obstructive sleep apnea (adult) (pediatric): Secondary | ICD-10-CM | POA: Diagnosis not present

## 2023-05-07 DIAGNOSIS — E11319 Type 2 diabetes mellitus with unspecified diabetic retinopathy without macular edema: Secondary | ICD-10-CM | POA: Diagnosis not present

## 2023-05-07 DIAGNOSIS — I639 Cerebral infarction, unspecified: Secondary | ICD-10-CM | POA: Diagnosis not present

## 2023-05-07 DIAGNOSIS — I10 Essential (primary) hypertension: Secondary | ICD-10-CM | POA: Diagnosis not present

## 2023-05-07 DIAGNOSIS — E1165 Type 2 diabetes mellitus with hyperglycemia: Secondary | ICD-10-CM | POA: Diagnosis not present

## 2023-05-18 NOTE — Progress Notes (Signed)
Remote pacemaker transmission.   

## 2023-05-24 ENCOUNTER — Ambulatory Visit (HOSPITAL_COMMUNITY): Payer: Medicare Other | Attending: Cardiology

## 2023-05-24 DIAGNOSIS — I4819 Other persistent atrial fibrillation: Secondary | ICD-10-CM | POA: Diagnosis not present

## 2023-05-24 DIAGNOSIS — I77810 Thoracic aortic ectasia: Secondary | ICD-10-CM | POA: Diagnosis not present

## 2023-05-24 LAB — ECHOCARDIOGRAM COMPLETE
Area-P 1/2: 3.26 cm2
P 1/2 time: 952 msec
S' Lateral: 2.6 cm

## 2023-06-01 ENCOUNTER — Other Ambulatory Visit: Payer: Self-pay | Admitting: *Deleted

## 2023-06-01 DIAGNOSIS — I351 Nonrheumatic aortic (valve) insufficiency: Secondary | ICD-10-CM

## 2023-06-01 DIAGNOSIS — E559 Vitamin D deficiency, unspecified: Secondary | ICD-10-CM | POA: Diagnosis not present

## 2023-06-01 DIAGNOSIS — I10 Essential (primary) hypertension: Secondary | ICD-10-CM | POA: Diagnosis not present

## 2023-06-01 DIAGNOSIS — I77810 Thoracic aortic ectasia: Secondary | ICD-10-CM

## 2023-06-01 DIAGNOSIS — E1165 Type 2 diabetes mellitus with hyperglycemia: Secondary | ICD-10-CM | POA: Diagnosis not present

## 2023-06-04 DIAGNOSIS — I10 Essential (primary) hypertension: Secondary | ICD-10-CM | POA: Diagnosis not present

## 2023-06-04 DIAGNOSIS — E1165 Type 2 diabetes mellitus with hyperglycemia: Secondary | ICD-10-CM | POA: Diagnosis not present

## 2023-06-04 DIAGNOSIS — E559 Vitamin D deficiency, unspecified: Secondary | ICD-10-CM | POA: Diagnosis not present

## 2023-06-04 DIAGNOSIS — Z23 Encounter for immunization: Secondary | ICD-10-CM | POA: Diagnosis not present

## 2023-07-03 ENCOUNTER — Ambulatory Visit (INDEPENDENT_AMBULATORY_CARE_PROVIDER_SITE_OTHER): Payer: Medicare Other | Admitting: Neurology

## 2023-07-03 DIAGNOSIS — Z7189 Other specified counseling: Secondary | ICD-10-CM

## 2023-07-03 DIAGNOSIS — G4731 Primary central sleep apnea: Secondary | ICD-10-CM | POA: Diagnosis not present

## 2023-07-03 DIAGNOSIS — I4811 Longstanding persistent atrial fibrillation: Secondary | ICD-10-CM

## 2023-07-04 ENCOUNTER — Telehealth: Payer: Self-pay | Admitting: Anesthesiology

## 2023-07-04 DIAGNOSIS — G4731 Primary central sleep apnea: Secondary | ICD-10-CM

## 2023-07-04 NOTE — Telephone Encounter (Signed)
-----   Message from Allisonia Dohmeier sent at 07/04/2023  1:08 PM EDT ----- There is no longer central apnea present, but OSA- CPAP did not work well and BiPAP was still suboptimal for this condition.  For now I can ask the patient to use ASV as long as the device functions well, or I can order auto BIPAP for her with a 4 cm water pressure spread and starting at 12/8 cm through 20/16 cm water, FM as named in the study.

## 2023-07-04 NOTE — Procedures (Signed)
[facility] PAP TITRATION INTERPRETATION REPORT   STUDY DATE: 07/03/2023      PATIENT NAME:  Heather May         DATE OF BIRTH:  Jan 23, 1946  PATIENT ID:  [id number]    TYPE OF STUDY:  ASV  READING PHYSICIAN: Melvyn Novas, MD SCORING TECHNICIAN: Domingo Cocking, RPSGT   HISTORY: Heather May had been referred to me by Dr. Lucia Gaskins, her primary care physician is Dr. Maryelizabeth Rowan, but it was her cardiologist Dr. Johney Frame who follows her for atrial fibrillation that recommended a sleep study.  The patient is status post watchman procedure and pacemaker implantation. CAD and PE history.  Current user of ASV at 5/13/3 cm water, here for new machine. Simplus small FFM user.  Here started on AirFit F 40 s/w model. Pacemaker and defibrillator patient. The Epworth Sleepiness Scale was 5 out of 24 (scores above or equal to 10 are suggestive of hypersomnolence).  The patient was seen in a sleep consult for Dr Lucia Gaskins on 01-08-2018. She is followed now by Amy Lomax. On 12/26/2022 seen for an air leak on her CPAP which is now controlled.  Problem is resolved? The patient did not bring machine or mask with her. Her residual AHI is reportedly too high - will need retitration.   This patient with essential tremor, status post SAH in 2011. CAD , PE and atrial fib.  She walks with a walker.  Her sleep apnea diagnosis was established in 02-08-2018, and titration was in 03-18-2018 - had 92 central apneas at that time, dx as complex sleep apnea syndrome. She was titrated to ASV ( see below) after CPAP and BiPAP failed.  She still spends 9 hours in bed each night but sleeps about 6-7 hours, between midnight and 7 AM.   Heather May continues to use is 100% compliance an ASV machine that was issued in late summer 2019 and will be 77 years old later this year.  The average usage is 7 hours and 16 minutes compliance is 100%, her minimum expiratory pressure is 5 maximum expiratory pressure is 5 her  minimum pressure support is 3 and her maximum pressure support is 15 cmH2O the residual AHI over 30 days is 14.1/h which is a little bit too high.   Air leakage now is controlled to the 95th percentile air leak is 7.6 L / minute since she changed back to a small size [employer] Simplus full facemask.     DESCRIPTION: A RPSGT  sleep technologist was in attendance for the duration of the recording.  Data collection, scoring, video monitoring, and reporting were performed in compliance with the AASM Manual for the Scoring of Sleep and Associated Events; (Hypopnea is scored based on the criteria listed in Section VIII D. 1b in the AASM Manual V2.6 using a 4% oxygen desaturation rule or Hypopnea is scored based on the criteria listed in Section VIII D. 1a in the AASM Manual V2.6 using 3% oxygen desaturation and /or arousal rule).  A physician certified by the American Board of Sleep Medicine reviewed each epoch of the study.  ADDITIONAL INFORMATION:  Height: 66.0 in Weight: 227 lb (BMI 36) Neck Size: 0.0 in    MEDICATIONS: Aspirin, Lotensin, Zyrtec, Vitamin D3, Trulicity, Hydrodiuril, Levemir Flextouch100, Mevacor, Multivitamin, Nitrostat, Zofran, Probiotic   SLEEP CONTINUITY AND SLEEP ARCHITECTURE:  Lights off was at 22:19: and lights on 04:56: (6.6 hours in bed).  Started at CPAP 5 cm and explored to  9 cm water, without resolution of apnea. Changed to BiPAP and started at 14/10 , explored up to 19/15 cm water  for only 14 minutes of TST - with no REM sleep time.   Total sleep time was 280.5 minutes (100.0% supine;  0.0% lateral;  0.0% prone, 11.6% REM sleep), with a decreased sleep efficiency at 70.7%. Sleep latency was normal at 23.5 minutes.  Of the total sleep time, the percentage of stage N1 sleep was 6.4%, stage N2 sleep was 82.0%, stage N3 sleep was 0.0%, and REM sleep was 11.6%. There were 2 Stage R periods observed on this study night, 19 awakenings (i.e. transitions to Stage W from any  sleep stage), and 64.0 total stage transitions. Wake after sleep onset (WASO) time accounted for 92 minutes.  AROUSAL: There were 42 arousals in total, for an arousal index of 9.0 arousals/hour.  Of these, 17 were identified as respiratory-related arousals (3.6 /h), 0 were PLM-related arousals (0.0 /h), and 31 were non-specific arousals (6.6 /h)  RESPIRATORY MONITORING:  Based on CMS criteria (using a 4% oxygen desaturation rule for scoring hypopneas), there were 42 apneas (35 obstructive; 1 central; 6 mixed), and 30 hypopneas.  Apnea index was 9.0. Hypopnea index was 6.4. The apnea-hypopnea index was 15.4 overall (15.4 supine, 0.0 non-supine; 33.2 REM, 33.2 supine REM). There were 0 respiratory effort-related arousals (RERAs).  The RERA index was 0.0 events/h. Total respiratory disturbance index (RDI) was 15.4 events/h. RDI results showed: supine RDI  15.4 /h; non-supine RDI 0.0 /h; REM RDI 33.2 /h, supine REM RDI 33.2 /h.   OXIMETRY: Respiratory events were associated with oxyhemoglobin desaturations (nadir during sleep 86% from a mean of 95%.Total sleep time spent at, or below 88% was 6.3 minutes, or 2.3% of total sleep time.  BODY POSITION: Duration of total sleep and percent of total sleep in their respective position is as follows: supine 280 minutes (100.0%). Total supine REM sleep time was 32 minutes (100.0% of total REM sleep). LIMB MOVEMENTS: There were 0 periodic limb movements of sleep (0.0/h)  CARDIAC: The EKG documented paced rhythm.  The average heart rate during sleep was 60 bpm.  The maximum heart rate during sleep was 89 bpm.    IMPRESSION: This patient was in 2019 clearly suffering from central apneas which are no longer present- her underling cardiac condition is likely improved to where OSA is now dominant. REM sleep dominant apnea.   1. CPAP failed to control events and BIPAP was not seen long enough at the highest pressure setting to allow conclusive result (14 minutes of sleep  only in 76 minutes of recording time- BiPAP was poorly tolerated). BILEVEL at highest pressure was 19/ 15 cm water with an AHI of 4.29/h. Unfortunately, we were unable to proceed to ASV due to time restraints.      RECOMMENDATIONS: change to small Vitera FFM is patient prefers this fit.  CPAP and lower settings of BiPAP were suboptimal. There was poor tolerance of BIPAP at 19/14 cm water and therefore very low sleep efficiency.  ASV would not be indicated for non-central apnea treatment.  I can offer the patient auto BIPAP at 4 cm pressure spread.  For now, I will ask the patient to continue using her ASV machine.     Melvyn Novas, MD            [facility] Enhanced PAP Report    General Information  Name: Heather May, Heather May BMI: 91 Physician: ,  ID: [id number] Height: 66 in Technician: Domingo Cocking  Sex: Female Weight: 227 lb Record: [id number]  Age: 77 [1946-03-24] Date: 07/03/2023 Scorer: Domingo Cocking    Recommended Settings  Protocol:   N/A  Device:   N/A  Mask:   N/A AHI:    N/A    Pressure Support:     N/A to   N/A cmH20   IPAP:     N/A to   N/A cmH20  EPAP:     N/A to   N/A cmH20      Max Pressure:     N/A  Backup Rate:     N/A  Humidity:     N/A AHI (4%):   N/A   Pressure Settings Phase THERAPY THERAPY THERAPY THERAPY THERAPY THERAPY THERAPY   Protocol - - - - - - -   Max Pressure - - - - - - -   IPAP 05 07 09 14 16 17 19    EPAP 05 07 09 10 12 13 15    PS - - - - - - -   Device - - - - - - -   Mask - - - - - - -   Backup Rate - - - - - - -   Humidity - - - - - - -  Time TRT 63.4m 70.40m 16.43m 74.25m 48.24m 48.11m 75.4m   TST 35.30m 60.75m 16.23m 73.18m 37.4m 44.76m 14.46m   Event Epoch 9 136 277 310 [phone]  Sleep Stage % Wake 12.5 14.2 0.0 1.3 22.9 8.3 74.3   % REM 0.0 14.9 100.0 4.8 0.0 8.0 0.0   % N1 15.7 5.0 0.0 2.0 6.8 4.5 25.0   % N2 84.3 80.2 0.0 93.2 93.2 87.5 75.0   % N3 0.0 0.0 0.0 0.0 0.0 0.0 0.0   Respiratory Total Events 4 27 13  32 5 17 1    Obs. Apn. 1 7 13 11 1 1 1    Mixed Apn. 0 0 0 4 1 1  0   Cen. Apn. 0 0 0 0 0 1 0   Obs. Hyp. 3 20 0 17 3 14  0   Cen. Hyp. 0 0 0 0 0 0 0   AHI 6.86 26.78 47.27 26.12 8.11 23.18 4.29   Supine AHI 6.86 26.78 47.27 26.12 8.11 23.18 4.29   Prone AHI 0.00 0.00 0.00 0.00 0.00 0.00 0.00   Side AHI 0.00 0.00 0.00 0.00 0.00 0.00 0.00  Respiratory (4%) Obs. Hyp. (4%) 3.00 12.00 0.00 5.00 2.00 8.00 0.00   Cen. Hyp. (4%) 0.00 0.00 0.00 0.00 0.00 0.00 0.00   AHI (4%) 6.86 18.84 47.27 16.33 6.49 15.00 4.29   Supine AHI (4%) 6.86 18.84 47.27 16.33 6.49 15.00 4.29   Prone AHI (4%) 0.00 0.00 0.00 0.00 0.00 0.00 0.00   Side AHI (4%) 0.00 0.00 0.00 0.00 0.00 0.00 0.00  Desat Profile <= 90% 17.24m 4.40m 0.23m 0.11m 0.73m 2.65m 0.49m   <= 80% 0.47m 0.68m 0.40m 0.77m 0.82m 0.68m 0.70m   <= 70% 0.62m 0.49m 0.30m 0.73m 0.51m 0.29m 0.108m   <= 60% 0.93m 0.78m 0.38m 0.95m 0.89m 0.56m 0.11m  Arousal Index Apnea 1.7 1.0 3.6 3.3 4.9 0.0 0.0   Hypopnea 1.7 4.0 0.0 1.6 0.0 0.0 0.0   LM 1.7 1.0 0.0 0.8 0.0 0.0 0.0   Spontaneous 10.3 11.9 3.6 0.8 11.4 4.1 4.3

## 2023-07-09 NOTE — Addendum Note (Signed)
Addended by: Bertram Savin on: 07/09/2023 05:11 PM   Modules accepted: Orders

## 2023-07-09 NOTE — Telephone Encounter (Signed)
Bipap order. Order sent to [facility].

## 2023-07-10 NOTE — Telephone Encounter (Signed)
Pecola Lawless, RN; Macon Large; Leticia Penna, Efraim Kaufmann; Santina Evans; Kathe Becton Received, Thank you!

## 2023-07-24 ENCOUNTER — Ambulatory Visit: Payer: Medicare Other

## 2023-07-24 DIAGNOSIS — I4819 Other persistent atrial fibrillation: Secondary | ICD-10-CM | POA: Diagnosis not present

## 2023-07-24 LAB — CUP PACEART REMOTE DEVICE CHECK
Battery Remaining Longevity: 3 mo
Battery Remaining Percentage: 5 %
Brady Statistic RA Percent Paced: 0 %
Brady Statistic RV Percent Paced: 87 %
Date Time Interrogation Session: 20241001043100
Implantable Pulse Generator Implant Date: 20160513
Lead Channel Impedance Value: 375 Ohm
Lead Channel Impedance Value: 668 Ohm
Lead Channel Pacing Threshold Amplitude: 1.7 V
Lead Channel Pacing Threshold Pulse Width: 0.4 ms
Lead Channel Setting Pacing Amplitude: 1.8 V
Lead Channel Setting Pacing Pulse Width: 0.4 ms
Lead Channel Setting Sensing Sensitivity: 0.6 mV
Pulse Gen Serial Number: 732886
Zone Setting Status: 755011

## 2023-08-02 ENCOUNTER — Ambulatory Visit: Payer: Medicare Other | Admitting: Family Medicine

## 2023-08-08 NOTE — Progress Notes (Signed)
Remote pacemaker transmission.   

## 2023-08-15 ENCOUNTER — Ambulatory Visit: Payer: Medicare Other | Attending: Cardiology | Admitting: Cardiology

## 2023-08-15 ENCOUNTER — Encounter: Payer: Self-pay | Admitting: Cardiology

## 2023-08-15 VITALS — BP 106/56 | HR 59 | Ht 66.0 in | Wt 222.0 lb

## 2023-08-15 DIAGNOSIS — I739 Peripheral vascular disease, unspecified: Secondary | ICD-10-CM | POA: Diagnosis not present

## 2023-08-15 DIAGNOSIS — I4821 Permanent atrial fibrillation: Secondary | ICD-10-CM | POA: Insufficient documentation

## 2023-08-15 DIAGNOSIS — I219 Acute myocardial infarction, unspecified: Secondary | ICD-10-CM | POA: Insufficient documentation

## 2023-08-15 DIAGNOSIS — I2510 Atherosclerotic heart disease of native coronary artery without angina pectoris: Secondary | ICD-10-CM | POA: Diagnosis not present

## 2023-08-15 NOTE — Progress Notes (Signed)
Cardiology Office Note:  .   Date:  08/15/2023  ID:  Heather May, DOB 05/15/1946, MRN [id number] PCP: Lewis Moccasin, MD  [facility] Providers Cardiologist:  Donato Schultz, MD     History of Present Illness: [name]   Heather May is a 77 y.o. female Discussed with the use of AI scribe software  History of Present Illness   The patient, a 77 year old with a history of atrial fibrillation, pacemaker, dilated ascending aorta, coronary artery disease status post PCI, peripheral arterial disease, and a prior history of stroke and subarachnoid hemorrhage, presents for a follow-up visit.   The patient's most recent echocardiogram, conducted in August 2024, revealed a mildly dilated ascending aorta of 41 millimeters, mild aortic valve regurgitation, and trivial mitral regurgitation. The patient's pump function was normal at 65 percent.   The patient's last device interrogation in October of the previous year showed stable lead parameters. The patient has a Watchman device in place and is on ASA monotherapy.  The patient had a percutaneous intervention in 2014, during which four stents were placed. Since moving from [place], the patient has experienced no ischemic symptoms or chest pain. The patient is also on low-dose aspirin, hydrochlorothiazide 25 mg daily, benazepril 20 mg daily, and lovastatin 40 mg at bedtime. The patient's LDL is 76. The patient's diabetes is being managed with insulin shots.  The patient denies any chest pain or unusual shortness of breath. The patient also denies any dizziness upon standing. The patient's blood pressure is well-controlled.           Studies Reviewed: [name]   EKG Interpretation Date/Time:  Wednesday August 15 2023 10:32:05 EDT Ventricular Rate:  60 PR Interval:    QRS Duration:  152 QT Interval:  474 QTC Calculation: 474 R Axis:   -74  Text Interpretation: Ventricular-paced rhythm Atrial fibrillation When compared with ECG of 15-Aug-2023 10:31, No  significant change was found Confirmed by Donato Schultz ([id number]) on 08/15/2023 10:36:28 AM    Results LABS LDL: 76  DIAGNOSTIC Echocardiogram: Mildly dilated ascending aorta of 41 mm, pump function 65%, mild aortic valve regurgitation, trivial mitral regurgitation (05/26/2023) Device Interrogation: Lead parameters stable, all leads functioning normally (07/2023) EKG: Atrial fibrillation, stable (07/2023)  Risk Assessment/Calculations:         Physical Exam:   VS:  BP (!) 106/56   Pulse (!) 59   Ht 5\' 6"  (1.676 m)   Wt 222 lb (100.7 kg)   SpO2 97%   BMI 35.83 kg/m    Wt Readings from Last 3 Encounters:  08/15/23 222 lb (100.7 kg)  04/18/23 227 lb (103 kg)  12/26/22 226 lb (102.5 kg)    GEN: Well nourished, well developed in no acute distress, walker.  NECK: No JVD; No carotid bruits CARDIAC: Pacer, RRR, 2/6 SM, no rubs, no gallops RESPIRATORY:  Clear to auscultation without rales, wheezing or rhonchi  ABDOMEN: Soft, non-tender, non-distended EXTREMITIES:  No edema; No deformity   ASSESSMENT AND PLAN: .       Atrial Fibrillation Permanent atrial fibrillation with pacemaker and Watchman 33mm device in place, 2016. Stable on recent device interrogation. No changes on EKG. -Continue current management.   Coronary Artery Disease Stable on Plavix monotherapy. No ischemic symptoms. 4 prior stents in [place].  -Continue ASA monotherapy.  Peripheral Arterial Disease Stable. -Continue current management.  Mild Aortic Valve Regurgitation Stable on recent echocardiogram. -Continue current management.  Mildly Dilated Ascending Aorta Stable on recent echocardiogram. 41 mm -Repeat echocardiogram  in one year.  Hypertension Stable on Hydrochlorothiazide 25mg  daily and Benazepril 20mg  daily. -Continue current medications.  Hyperlipidemia Stable on Lovastatin 40mg  at bedtime. LDL 76. -Continue Lovastatin 40mg  at bedtime.  Diabetes Mellitus Stable on insulin  therapy. -Continue current management.  Pacemaker Stable on recent device interrogation. -Continue current management and monitoring by Dr. Lalla Brothers. Has upcoming visit soon.  -Getting close to ERI  Follow-up in one year.            Signed, Donato Schultz, MD

## 2023-08-15 NOTE — Patient Instructions (Signed)
Medication Instructions:  Your physician recommends that you continue on your current medications as directed. Please refer to the Current Medication list given to you today.  *If you need a refill on your cardiac medications before your next appointment, please call your pharmacy*  Follow-Up: At [facility], you and your health needs are our priority.  As part of our continuing mission to provide you with exceptional heart care, we have created designated Provider Care Teams.  These Care Teams include your primary Cardiologist (physician) and Advanced Practice Providers (APPs -  Physician Assistants and Nurse Practitioners) who all work together to provide you with the care you need, when you need it.  Your next appointment:   1 year   Provider:   Donato Schultz, MD

## 2023-08-22 ENCOUNTER — Encounter: Payer: Self-pay | Admitting: Cardiology

## 2023-08-22 ENCOUNTER — Ambulatory Visit: Payer: Medicare Other | Attending: Cardiology | Admitting: Cardiology

## 2023-08-22 VITALS — BP 110/74 | HR 64 | Ht 66.0 in | Wt 221.0 lb

## 2023-08-22 DIAGNOSIS — R001 Bradycardia, unspecified: Secondary | ICD-10-CM | POA: Insufficient documentation

## 2023-08-22 DIAGNOSIS — Z950 Presence of cardiac pacemaker: Secondary | ICD-10-CM | POA: Diagnosis not present

## 2023-08-22 DIAGNOSIS — I4821 Permanent atrial fibrillation: Secondary | ICD-10-CM | POA: Diagnosis not present

## 2023-08-22 LAB — CUP PACEART INCLINIC DEVICE CHECK
Date Time Interrogation Session: 20241030163339
Implantable Lead Connection Status: 753985
Implantable Lead Connection Status: 753985
Implantable Lead Implant Date: 20160513
Implantable Lead Implant Date: 20160513
Implantable Lead Location: 753859
Implantable Lead Location: 753860
Implantable Lead Model: 4135
Implantable Lead Serial Number: 29626150
Implantable Pulse Generator Implant Date: 20160513
Lead Channel Impedance Value: 382 Ohm
Lead Channel Impedance Value: 664 Ohm
Lead Channel Pacing Threshold Amplitude: 1.2 V
Lead Channel Pacing Threshold Pulse Width: 0.4 ms
Lead Channel Setting Pacing Amplitude: 3.5 V
Lead Channel Setting Pacing Pulse Width: 0.4 ms
Lead Channel Setting Sensing Sensitivity: 0.6 mV
Pulse Gen Serial Number: 732886
Zone Setting Status: 755011

## 2023-08-22 NOTE — Progress Notes (Signed)
Electrophysiology Office Follow up Visit Note:    Date:  08/22/2023   ID:  Heather May, DOB October 29, 1945, MRN [id number]  PCP:  Heather Moccasin, MD  [facility] Cardiologist:  Heather Schultz, MD  [facility] Electrophysiologist:  Heather Prude, MD    Interval History:     Heather May is a 77 y.o. female who presents for a follow up visit.   Discussed the use of AI scribe software for clinical note transcription with the patient, who gave verbal consent to proceed.  History of Present Illness   Heather May, with a complex medical history including diabetes, hypertension, hyperlipidemia, coronary artery disease, atrial fibrillation, stroke, subarachnoid hemorrhage, and symptomatic bradycardia, presents for routine follow-up. She has a permanent pacemaker and a Watchman device in place. She reports no issues with the pacemaker and denies any discomfort or pain at the incision site. She expresses no concerns or questions about her pacemaker device or her atrial fibrillation.            Past medical, surgical, social and family history were reviewed.  ROS:   Please see the history of present illness.    All other systems reviewed and are negative.  EKGs/Labs/Other Studies Reviewed:    The following studies were reviewed today:  08/22/2023 in clinic device interrogation personally reviewed 3 months estimated until ERI Lead parameter stable 88% ventricular pacing No episodes        Physical Exam:    VS:  BP 110/74   Pulse 64   Ht 5\' 6"  (1.676 m)   Wt 221 lb (100.2 kg)   SpO2 96%   BMI 35.67 kg/m     Wt Readings from Last 3 Encounters:  08/22/23 221 lb (100.2 kg)  08/15/23 222 lb (100.7 kg)  04/18/23 227 lb (103 kg)    Physical Exam   CARDIOVASCULAR: Normal heart sounds. SKIN: Incision site at pacemaker implantation appears without complications, no signs of infection or adverse reaction.           ASSESSMENT:    1. Permanent atrial fibrillation  (HCC)   2. Cardiac pacemaker in situ   3. Symptomatic bradycardia    PLAN:    In order of problems listed above:  Assessment and Plan    Permanent Pacemaker Pacemaker battery nearing end of life (estimated 3 months remaining). No current symptomatic bradycardia. Pacemaker site and leads in good condition. -Schedule generator replacement procedure in the coming months. -Procedure to be performed either at this location or in [place] for ease of scheduling. -Aspirin to be held for 5 days prior to generator change.  Atrial Fibrillation with Watchman Device Stable, no new episodes. -Continue aspirin. -Routine follow-up with Dr. Lucia May.  Follow-up -Annual follow-up with EP, APP. -Next visit anticipated for generator change procedure.        Risks, benefits, and alternatives to pulse generator replacement were discussed in detail today.  The patient understands that risks include but are not limited to bleeding, infection, pneumothorax, perforation, tamponade, vascular damage, renal failure, MI, stroke, death, inappropriate shocks, damage to his existing leads, and lead dislodgement and wishes to proceed.  We will therefore schedule the procedure at the next available time.        Signed, Heather Dunn, MD, [facility], [facility] 08/22/2023 5:04 PM    Electrophysiology [facility]

## 2023-08-22 NOTE — Patient Instructions (Signed)
 Medication Instructions:  Your physician recommends that you continue on your current medications as directed. Please refer to the Current Medication list given to you today.  *If you need a refill on your cardiac medications before your next appointment, please call your pharmacy*  Follow-Up: At [facility], you and your health needs are our priority.  As part of our continuing mission to provide you with exceptional heart care, we have created designated Provider Care Teams.  These Care Teams include your primary Cardiologist (physician) and Advanced Practice Providers (APPs -  Physician Assistants and Nurse Practitioners) who all work together to provide you with the care you need, when you need it.  Your next appointment:   1 year  Provider:   You will see one of the following Advanced Practice Providers on your designated Care Team:   Francis Dowse, Charlott Holler [address]" [place], [place] Sherie Don, NP Canary Brim, NP

## 2023-08-27 ENCOUNTER — Ambulatory Visit (INDEPENDENT_AMBULATORY_CARE_PROVIDER_SITE_OTHER): Payer: Medicare Other | Admitting: Family Medicine

## 2023-08-27 ENCOUNTER — Ambulatory Visit (INDEPENDENT_AMBULATORY_CARE_PROVIDER_SITE_OTHER): Payer: Medicare Other

## 2023-08-27 ENCOUNTER — Encounter: Payer: Self-pay | Admitting: Family Medicine

## 2023-08-27 VITALS — BP 121/56 | HR 61 | Ht 66.0 in | Wt 226.0 lb

## 2023-08-27 DIAGNOSIS — I4821 Permanent atrial fibrillation: Secondary | ICD-10-CM

## 2023-08-27 DIAGNOSIS — G4733 Obstructive sleep apnea (adult) (pediatric): Secondary | ICD-10-CM

## 2023-08-28 DIAGNOSIS — R5383 Other fatigue: Secondary | ICD-10-CM | POA: Diagnosis not present

## 2023-08-28 DIAGNOSIS — E559 Vitamin D deficiency, unspecified: Secondary | ICD-10-CM | POA: Diagnosis not present

## 2023-08-28 DIAGNOSIS — R739 Hyperglycemia, unspecified: Secondary | ICD-10-CM | POA: Diagnosis not present

## 2023-08-28 DIAGNOSIS — E785 Hyperlipidemia, unspecified: Secondary | ICD-10-CM | POA: Diagnosis not present

## 2023-08-30 DIAGNOSIS — Z23 Encounter for immunization: Secondary | ICD-10-CM | POA: Diagnosis not present

## 2023-08-30 DIAGNOSIS — Z1331 Encounter for screening for depression: Secondary | ICD-10-CM | POA: Diagnosis not present

## 2023-08-30 DIAGNOSIS — Z0000 Encounter for general adult medical examination without abnormal findings: Secondary | ICD-10-CM | POA: Diagnosis not present

## 2023-08-30 DIAGNOSIS — Z1339 Encounter for screening examination for other mental health and behavioral disorders: Secondary | ICD-10-CM | POA: Diagnosis not present

## 2023-09-04 DIAGNOSIS — Z789 Other specified health status: Secondary | ICD-10-CM | POA: Diagnosis not present

## 2023-09-04 DIAGNOSIS — E1169 Type 2 diabetes mellitus with other specified complication: Secondary | ICD-10-CM | POA: Diagnosis not present

## 2023-09-04 DIAGNOSIS — I10 Essential (primary) hypertension: Secondary | ICD-10-CM | POA: Diagnosis not present

## 2023-09-04 DIAGNOSIS — E782 Mixed hyperlipidemia: Secondary | ICD-10-CM | POA: Diagnosis not present

## 2023-09-04 DIAGNOSIS — E559 Vitamin D deficiency, unspecified: Secondary | ICD-10-CM | POA: Diagnosis not present

## 2023-09-04 DIAGNOSIS — E1165 Type 2 diabetes mellitus with hyperglycemia: Secondary | ICD-10-CM | POA: Diagnosis not present

## 2023-09-04 LAB — CUP PACEART REMOTE DEVICE CHECK
Battery Remaining Longevity: 3 mo — CL
Battery Remaining Percentage: 3 %
Brady Statistic RA Percent Paced: 0 %
Brady Statistic RV Percent Paced: 93 %
Date Time Interrogation Session: 20241104043200
Implantable Lead Connection Status: 753985
Implantable Lead Connection Status: 753985
Implantable Lead Implant Date: 20160513
Implantable Lead Implant Date: 20160513
Implantable Lead Location: 753859
Implantable Lead Location: 753860
Implantable Lead Model: 4135
Implantable Lead Serial Number: 29626150
Implantable Pulse Generator Implant Date: 20160513
Lead Channel Impedance Value: 366 Ohm
Lead Channel Impedance Value: 632 Ohm
Lead Channel Pacing Threshold Amplitude: 1.6 V
Lead Channel Pacing Threshold Pulse Width: 0.4 ms
Lead Channel Setting Pacing Amplitude: 3.5 V
Lead Channel Setting Pacing Pulse Width: 0.4 ms
Lead Channel Setting Sensing Sensitivity: 0.6 mV
Pulse Gen Serial Number: 732886
Zone Setting Status: 755011

## 2023-09-07 DIAGNOSIS — H2513 Age-related nuclear cataract, bilateral: Secondary | ICD-10-CM | POA: Diagnosis not present

## 2023-09-07 DIAGNOSIS — E119 Type 2 diabetes mellitus without complications: Secondary | ICD-10-CM | POA: Diagnosis not present

## 2023-09-18 NOTE — Progress Notes (Signed)
Remote pacemaker transmission.   

## 2023-09-18 NOTE — Addendum Note (Signed)
Addended by: Geralyn Flash D on: 09/18/2023 11:10 AM   Modules accepted: Level of Service

## 2023-09-27 ENCOUNTER — Ambulatory Visit (INDEPENDENT_AMBULATORY_CARE_PROVIDER_SITE_OTHER): Payer: Medicare Other

## 2023-09-27 DIAGNOSIS — I219 Acute myocardial infarction, unspecified: Secondary | ICD-10-CM

## 2023-10-04 LAB — CUP PACEART REMOTE DEVICE CHECK
Battery Remaining Longevity: 3 mo
Battery Remaining Percentage: 4 %
Brady Statistic RA Percent Paced: 0 %
Brady Statistic RV Percent Paced: 94 %
Date Time Interrogation Session: 20241210044400
Implantable Lead Connection Status: 753985
Implantable Lead Connection Status: 753985
Implantable Lead Implant Date: 20160513
Implantable Lead Implant Date: 20160513
Implantable Lead Location: 753859
Implantable Lead Location: 753860
Implantable Lead Model: 4135
Implantable Lead Serial Number: 29626150
Implantable Pulse Generator Implant Date: 20160513
Lead Channel Impedance Value: 388 Ohm
Lead Channel Impedance Value: 674 Ohm
Lead Channel Pacing Threshold Amplitude: 1.6 V
Lead Channel Pacing Threshold Pulse Width: 0.4 ms
Lead Channel Setting Pacing Amplitude: 2 V
Lead Channel Setting Pacing Pulse Width: 0.4 ms
Lead Channel Setting Sensing Sensitivity: 0.6 mV
Pulse Gen Serial Number: 732886
Zone Setting Status: 755011

## 2023-10-09 ENCOUNTER — Telehealth: Payer: Self-pay | Admitting: Cardiology

## 2023-10-09 DIAGNOSIS — I4821 Permanent atrial fibrillation: Secondary | ICD-10-CM

## 2023-10-09 DIAGNOSIS — Z950 Presence of cardiac pacemaker: Secondary | ICD-10-CM

## 2023-10-09 DIAGNOSIS — R001 Bradycardia, unspecified: Secondary | ICD-10-CM

## 2023-10-09 NOTE — Telephone Encounter (Signed)
Returned call to patient. She states she was returning a call to [facility]

## 2023-10-09 NOTE — Telephone Encounter (Signed)
Per pt  returning call to nurse

## 2023-10-10 NOTE — Telephone Encounter (Signed)
Pt returning nurse's call. Please advise

## 2023-10-10 NOTE — Telephone Encounter (Signed)
Left message for patient to call back.  She needs to be scheduled for a generator change.

## 2023-10-10 NOTE — Telephone Encounter (Signed)
Left message for patient to call back  

## 2023-10-15 NOTE — Telephone Encounter (Signed)
Spoke with the patient and scheduled her for a PPM gen change.  Her pacemaker had a recall for early depletion. She is close to ERI. Procedure was discussed at her visit on 08/22/23

## 2023-10-29 ENCOUNTER — Telehealth: Payer: Self-pay | Admitting: Cardiology

## 2023-10-29 ENCOUNTER — Ambulatory Visit: Payer: Medicare Other

## 2023-10-29 DIAGNOSIS — I4821 Permanent atrial fibrillation: Secondary | ICD-10-CM | POA: Diagnosis not present

## 2023-10-29 DIAGNOSIS — R001 Bradycardia, unspecified: Secondary | ICD-10-CM | POA: Diagnosis not present

## 2023-10-29 DIAGNOSIS — Z950 Presence of cardiac pacemaker: Secondary | ICD-10-CM | POA: Diagnosis not present

## 2023-10-29 NOTE — Telephone Encounter (Signed)
 Pt called in asking for her battery procedure changeout to be placed in her mychart because she did not receive letter.

## 2023-10-30 ENCOUNTER — Encounter: Payer: Self-pay | Admitting: Cardiology

## 2023-10-30 LAB — BASIC METABOLIC PANEL
BUN/Creatinine Ratio: 30 — ABNORMAL HIGH (ref 12–28)
BUN: 37 mg/dL — ABNORMAL HIGH (ref 8–27)
CO2: 25 mmol/L (ref 20–29)
Calcium: 10.1 mg/dL (ref 8.7–10.3)
Chloride: 99 mmol/L (ref 96–106)
Creatinine, Ser: 1.23 mg/dL — ABNORMAL HIGH (ref 0.57–1.00)
Glucose: 139 mg/dL — ABNORMAL HIGH (ref 70–99)
Potassium: 4.6 mmol/L (ref 3.5–5.2)
Sodium: 140 mmol/L (ref 134–144)
eGFR: 45 mL/min/{1.73_m2} — ABNORMAL LOW (ref >59)

## 2023-10-30 LAB — CUP PACEART REMOTE DEVICE CHECK
Battery Remaining Longevity: 3 mo — CL
Battery Remaining Percentage: 1 %
Brady Statistic RA Percent Paced: 0 %
Brady Statistic RV Percent Paced: 93 %
Date Time Interrogation Session: 20250106043200
Implantable Lead Connection Status: 753985
Implantable Lead Connection Status: 753985
Implantable Lead Implant Date: 20160513
Implantable Lead Implant Date: 20160513
Implantable Lead Location: 753859
Implantable Lead Location: 753860
Implantable Lead Model: 4135
Implantable Lead Serial Number: 29626150
Implantable Pulse Generator Implant Date: 20160513
Lead Channel Impedance Value: 383 Ohm
Lead Channel Impedance Value: 649 Ohm
Lead Channel Pacing Threshold Amplitude: 2 V
Lead Channel Pacing Threshold Pulse Width: 0.4 ms
Lead Channel Setting Pacing Amplitude: 3.5 V
Lead Channel Setting Pacing Pulse Width: 0.4 ms
Lead Channel Setting Sensing Sensitivity: 0.6 mV
Pulse Gen Serial Number: 732886
Zone Setting Status: 755011

## 2023-10-30 LAB — CBC WITH DIFFERENTIAL/PLATELET
Basophils Absolute: 0.1 10*3/uL (ref 0.0–0.2)
Basos: 1 %
EOS (ABSOLUTE): 0.2 10*3/uL (ref 0.0–0.4)
Eos: 3 %
Hematocrit: 43.4 % (ref 34.0–46.6)
Hemoglobin: 14.3 g/dL (ref 11.1–15.9)
Immature Grans (Abs): 0 10*3/uL (ref 0.0–0.1)
Immature Granulocytes: 1 %
Lymphocytes Absolute: 1.4 10*3/uL (ref 0.7–3.1)
Lymphs: 22 %
MCH: 32.4 pg (ref 26.6–33.0)
MCHC: 32.9 g/dL (ref 31.5–35.7)
MCV: 98 fL — ABNORMAL HIGH (ref 79–97)
Monocytes Absolute: 0.7 10*3/uL (ref 0.1–0.9)
Monocytes: 11 %
Neutrophils Absolute: 4 10*3/uL (ref 1.4–7.0)
Neutrophils: 62 %
Platelets: 217 10*3/uL (ref 150–450)
RBC: 4.41 x10E6/uL (ref 3.77–5.28)
RDW: 13.2 % (ref 11.7–15.4)
WBC: 6.4 10*3/uL (ref 3.4–10.8)

## 2023-10-31 ENCOUNTER — Telehealth: Payer: Self-pay | Admitting: Family Medicine

## 2023-10-31 NOTE — Telephone Encounter (Signed)
 [name]

## 2023-10-31 NOTE — Telephone Encounter (Signed)
 Please attach 30 day report to review AHI. TY.

## 2023-11-05 NOTE — Pre-Procedure Instructions (Signed)
 Attempted to call patient regarding procedure instructions.  Left voicemail on the following items: Arrival time 1000 Nothing to eat or drink after midnight No meds AM of procedure Responsible person to drive you home and stay with you for 24 hrs Wash with special soap night before and morning of procedure

## 2023-11-06 ENCOUNTER — Ambulatory Visit (HOSPITAL_COMMUNITY)
Admission: RE | Admit: 2023-11-06 | Discharge: 2023-11-06 | Disposition: A | Discharge status: Home / Self Care | Payer: Medicare Other | Attending: Cardiology | Admitting: Cardiology

## 2023-11-06 ENCOUNTER — Encounter (HOSPITAL_COMMUNITY)
Admission: RE | Disposition: A | Discharge status: Home / Self Care | Payer: Medicare Other | Source: Home / Self Care | Attending: Cardiology

## 2023-11-06 ENCOUNTER — Other Ambulatory Visit: Payer: Self-pay

## 2023-11-06 DIAGNOSIS — I2510 Atherosclerotic heart disease of native coronary artery without angina pectoris: Secondary | ICD-10-CM | POA: Diagnosis not present

## 2023-11-06 DIAGNOSIS — I10 Essential (primary) hypertension: Secondary | ICD-10-CM | POA: Insufficient documentation

## 2023-11-06 DIAGNOSIS — Z8673 Personal history of transient ischemic attack (TIA), and cerebral infarction without residual deficits: Secondary | ICD-10-CM | POA: Insufficient documentation

## 2023-11-06 DIAGNOSIS — I4821 Permanent atrial fibrillation: Secondary | ICD-10-CM | POA: Diagnosis not present

## 2023-11-06 DIAGNOSIS — E785 Hyperlipidemia, unspecified: Secondary | ICD-10-CM | POA: Diagnosis not present

## 2023-11-06 DIAGNOSIS — Z45010 Encounter for checking and testing of cardiac pacemaker pulse generator [battery]: Secondary | ICD-10-CM | POA: Insufficient documentation

## 2023-11-06 DIAGNOSIS — R001 Bradycardia, unspecified: Secondary | ICD-10-CM | POA: Insufficient documentation

## 2023-11-06 DIAGNOSIS — Z7982 Long term (current) use of aspirin: Secondary | ICD-10-CM | POA: Insufficient documentation

## 2023-11-06 DIAGNOSIS — E119 Type 2 diabetes mellitus without complications: Secondary | ICD-10-CM | POA: Diagnosis not present

## 2023-11-06 HISTORY — PX: PPM GENERATOR CHANGEOUT: EP1233

## 2023-11-06 LAB — GLUCOSE, CAPILLARY
Glucose-Capillary: 150 mg/dL — ABNORMAL HIGH (ref 70–99)
Glucose-Capillary: 123 mg/dL — ABNORMAL HIGH (ref 70–99)

## 2023-11-06 SURGERY — PPM GENERATOR CHANGEOUT
Anesthesia: LOCAL

## 2023-11-06 MED ORDER — ONDANSETRON HCL 4 MG/2ML IJ SOLN
INTRAMUSCULAR | Status: AC
  Filled 2023-11-06: qty 2

## 2023-11-06 MED ORDER — LIDOCAINE HCL (PF) 1 % IJ SOLN
INTRAMUSCULAR | Status: DC | PRN
  Administered 2023-11-06: 60 mL

## 2023-11-06 MED ORDER — ONDANSETRON HCL 4 MG/2ML IJ SOLN
INTRAMUSCULAR | Status: DC | PRN
  Administered 2023-11-06: 4 mg via INTRAVENOUS

## 2023-11-06 MED ORDER — SODIUM CHLORIDE 0.9 % IV SOLN
INTRAVENOUS | Status: AC
  Filled 2023-11-06: qty 2

## 2023-11-06 MED ORDER — MIDAZOLAM HCL 5 MG/5ML IJ SOLN
INTRAMUSCULAR | Status: DC | PRN
  Administered 2023-11-06 (×2): 1 mg via INTRAVENOUS

## 2023-11-06 MED ORDER — ACETAMINOPHEN 325 MG PO TABS: 325.0000 mg | ORAL_TABLET | ORAL | Status: DC | PRN

## 2023-11-06 MED ORDER — CEFAZOLIN SODIUM-DEXTROSE 2-4 GM/100ML-% IV SOLN
2.0000 g | INTRAVENOUS | Status: AC
  Administered 2023-11-06: 2 g via INTRAVENOUS

## 2023-11-06 MED ORDER — SODIUM CHLORIDE 0.9 % IV SOLN
80.0000 mg | INTRAVENOUS | Status: AC
  Administered 2023-11-06: 80 mg

## 2023-11-06 MED ORDER — ONDANSETRON HCL 4 MG/2ML IJ SOLN: 4.0000 mg | Freq: Four times a day (QID) | INTRAMUSCULAR | Status: DC | PRN

## 2023-11-06 MED ORDER — MIDAZOLAM HCL 5 MG/5ML IJ SOLN
INTRAMUSCULAR | Status: AC
  Filled 2023-11-06: qty 5

## 2023-11-06 MED ORDER — POVIDONE-IODINE 10 % EX SWAB
2.0000 | Freq: Once | CUTANEOUS | Status: AC
  Administered 2023-11-06: 2 via TOPICAL

## 2023-11-06 MED ORDER — CHLORHEXIDINE GLUCONATE 4 % EX SOLN
4.0000 | Freq: Once | CUTANEOUS | Status: AC
  Administered 2023-11-06: 4 via TOPICAL
  Filled 2023-11-06: qty 60

## 2023-11-06 MED ORDER — SODIUM CHLORIDE 0.9 % IV SOLN: INTRAVENOUS | Status: DC

## 2023-11-06 MED ORDER — LIDOCAINE HCL (PF) 1 % IJ SOLN
INTRAMUSCULAR | Status: AC
  Filled 2023-11-06: qty 60

## 2023-11-06 MED ORDER — FENTANYL CITRATE (PF) 100 MCG/2ML IJ SOLN
INTRAMUSCULAR | Status: AC
  Filled 2023-11-06: qty 2

## 2023-11-06 MED ORDER — ASPIRIN EC 81 MG PO TBEC: 81.0000 mg | DELAYED_RELEASE_TABLET | Freq: Every day | ORAL | Status: AC

## 2023-11-06 MED ORDER — CEFAZOLIN SODIUM-DEXTROSE 2-4 GM/100ML-% IV SOLN
INTRAVENOUS | Status: AC
  Filled 2023-11-06: qty 100

## 2023-11-06 SURGICAL SUPPLY — 6 items
CABLE SURGICAL S-101-97-12 (CABLE) ×1 IMPLANT
PACEMAKER ACCOLADE DR-EL (Pacemaker) IMPLANT
PAD DEFIB RADIO PHYSIO CONN (PAD) ×1 IMPLANT
POUCH AIGIS-R ANTIBACT PPM (Mesh General) ×1 IMPLANT
POUCH AIGIS-R ANTIBACT PPM MED (Mesh General) IMPLANT
TRAY PACEMAKER INSERTION (PACKS) ×1 IMPLANT

## 2023-11-06 NOTE — H&P (Signed)
 Electrophysiology Office Follow up Visit Note:     Date:  11/06/2023    ID:  Heather May, DOB December 09, 1945, MRN [id number]   PCP:  Waylan Almarie SAUNDERS, MD           [facility] Cardiologist:  Oneil Parchment, MD  [facility] Electrophysiologist:  OLE ONEIDA HOLTS, MD      Interval History:       Heather May is a 78 y.o. female who presents for a follow up visit.    Discussed the use of AI scribe software for clinical note transcription with the patient, who gave verbal consent to proceed.   History of Present Illness   Heather May, with a complex medical history including diabetes, hypertension, hyperlipidemia, coronary artery disease, atrial fibrillation, stroke, subarachnoid hemorrhage, and symptomatic bradycardia, presents for routine follow-up. She has a permanent pacemaker and a Watchman device in place. She reports no issues with the pacemaker and denies any discomfort or pain at the incision site. She expresses no concerns or questions about her pacemaker device or her atrial fibrillation.      Presents for generator replacement.       Objective Past medical, surgical, social and family history were reviewed.   ROS:   Please see the history of present illness.    All other systems reviewed and are negative.   EKGs/Labs/Other Studies Reviewed:     The following studies were reviewed today:   08/22/2023 in clinic device interrogation personally reviewed 3 months estimated until ERI Lead parameter stable 88% ventricular pacing No episodes           Physical Exam:     VS:  BP 117/59   Pulse 63   Ht 5' 6 (1.676 m)   Wt 221 lb (100.2 kg)   SpO2 96%   BMI 35.67 kg/m         Wt Readings from Last 3 Encounters:  08/22/23 221 lb (100.2 kg)  08/15/23 222 lb (100.7 kg)  04/18/23 227 lb (103 kg)    Physical Exam   CARDIOVASCULAR: Normal heart sounds. SKIN: Incision site at pacemaker implantation appears without complications, no signs of infection or adverse  reaction.           Assessment ASSESSMENT:     1. Permanent atrial fibrillation (HCC)   2. Cardiac pacemaker in situ   3. Symptomatic bradycardia     PLAN:     In order of problems listed above:   Assessment and Plan    Permanent Pacemaker Pacemaker battery nearing end of life (estimated 3 months remaining). No current symptomatic bradycardia. Pacemaker site and leads in good condition. -Schedule generator replacement procedure in the coming months. -Procedure to be performed either at this location or in [place] for ease of scheduling. -Aspirin  to be held for 5 days prior to generator change.   Atrial Fibrillation with Watchman Device Stable, no new episodes. -Continue aspirin . -Routine follow-up with Dr. Lossie.   Follow-up -Annual follow-up with EP, APP. -Next visit anticipated for generator change procedure.           Risks, benefits, and alternatives to pulse generator replacement were discussed in detail today.  The patient understands that risks include but are not limited to bleeding, infection, pneumothorax, perforation, tamponade, vascular damage, renal failure, MI, stroke, death, inappropriate shocks, damage to his existing leads, and lead dislodgement and wishes to proceed.  We will therefore schedule the procedure at the next available time.  Presents for generator replacement. Procedure reviewed.       Signed, Ole Holts, MD, [facility], [facility] 11/06/2023 Electrophysiology [facility]

## 2023-11-06 NOTE — Discharge Instructions (Signed)

## 2023-11-07 ENCOUNTER — Encounter (HOSPITAL_COMMUNITY): Payer: Self-pay | Admitting: Cardiology

## 2023-11-08 ENCOUNTER — Encounter: Payer: Self-pay | Admitting: Family Medicine

## 2023-11-09 ENCOUNTER — Ambulatory Visit: Payer: Medicare Other | Attending: Internal Medicine

## 2023-11-09 ENCOUNTER — Telehealth: Payer: Self-pay

## 2023-11-09 DIAGNOSIS — Z950 Presence of cardiac pacemaker: Secondary | ICD-10-CM

## 2023-11-09 NOTE — Progress Notes (Signed)
Patient brought in today because she was concerned there was bleeding at her wound site and states it feels "tight".   Outer bandage still present today (she has not removed).  Pad is white with no signs of drainage at all.  After removal of outer bandage, steri strips nicely intact with no signs of active bleeding.  Minimum amount of dried scabbing noted on strips.  There is noted bruising to left chest at about 5 oclock position from wound site.  Appears normal bruising and healing at this point.  There was minimal swelling in pocket, no signs of active infection, bleeding or hematoma.   Patient instructed to keep area dry and monitoring instructions given to watch for increased signs of swelling, and/or infection..  She has our direct contact information of any further questions or concerns.   Patient is going to go home and hit her button on Latitude monitor to re-connect now with her new generator.  This should resume monitoring communications.

## 2023-11-09 NOTE — Telephone Encounter (Signed)
Follow-up after same day discharge: Implant date: 11/06/2023 MD: Lalla Brothers  Device: [name] PPM  Location: Left Chest    Wound check visit: 11/21/2023 90 day MD follow-up: 02/08/2024  Remote Transmission received:n/a  Dressing/sling removed: n/a  Confirm OAC restart on: not on an OAC  Please continue to monitor your cardiac device site for redness, swelling, and drainage. Call the device clinic at [phone] if you experience these symptoms, fever/chills, or have questions about your device.   Pt called stating she is bleeding. She is coming into the office today at 1:30 pm.  Remote monitoring is used to monitor your cardiac device from home. This monitoring is scheduled every 91 days by our office. It allows [place] to keep an eye on the functioning of your device to ensure it is working properly.

## 2023-11-14 ENCOUNTER — Ambulatory Visit (INDEPENDENT_AMBULATORY_CARE_PROVIDER_SITE_OTHER): Payer: Medicare Other | Admitting: Podiatry

## 2023-11-14 ENCOUNTER — Encounter: Payer: Self-pay | Admitting: Podiatry

## 2023-11-14 DIAGNOSIS — M21619 Bunion of unspecified foot: Secondary | ICD-10-CM

## 2023-11-14 DIAGNOSIS — L97511 Non-pressure chronic ulcer of other part of right foot limited to breakdown of skin: Secondary | ICD-10-CM

## 2023-11-14 DIAGNOSIS — M2041 Other hammer toe(s) (acquired), right foot: Secondary | ICD-10-CM | POA: Diagnosis not present

## 2023-11-14 MED ORDER — DOXYCYCLINE HYCLATE 100 MG PO TABS: 100.0000 mg | ORAL_TABLET | Freq: Two times a day (BID) | ORAL | 0 refills | Status: DC

## 2023-11-14 NOTE — Telephone Encounter (Signed)
Transmission received 11/12/2023.

## 2023-11-14 NOTE — Progress Notes (Signed)
Subjective:   Patient ID: Heather May, female   DOB: 78 y.o.   MRN: [id number]   HPI Patient presents with caregiver moderate obesity noted with digital deformities bunion deformity and keratotic tissue formation with pain third digit right with diabetes not in good control   ROS      Objective:  Physical Exam  Neurovascular status was found to be intact there is discoloration distal third digit right with abnormal position of the toe pressure occurring secondary to the way she ambulates and patient using a walker.  Patient also structural bunion deformity and hammertoe deformity contributing to the pathology     Assessment:  Several problems with 1 being significant structural hammertoe deformity bilateral with bunion deformity bilateral and secondarily a probability for a distal ulcer of the right third digit with probability for drainage H&P     Plan:  H&P reviewed and I went ahead today sterile sharp debridement of area accomplished there was some thick purulent drainage consistent with probable staph I cleaned this out flushed and did not note bone tendon subcutaneous exposure applied sterile dressing dispensed a crest pad to lift up the toes instructed on soaks placed on doxycycline reappoint as symptoms indicate.  Do not recommend treatment bunion hammertoe but may require digital stabilization with bunion correction

## 2023-11-21 ENCOUNTER — Ambulatory Visit: Payer: Medicare Other

## 2023-11-21 DIAGNOSIS — I4819 Other persistent atrial fibrillation: Secondary | ICD-10-CM | POA: Diagnosis not present

## 2023-11-21 LAB — CUP PACEART INCLINIC DEVICE CHECK
Date Time Interrogation Session: 20250129201320
Implantable Lead Connection Status: 753985
Implantable Lead Connection Status: 753985
Implantable Lead Implant Date: 20160513
Implantable Lead Implant Date: 20160513
Implantable Lead Location: 753859
Implantable Lead Location: 753860
Implantable Lead Model: 4135
Implantable Lead Serial Number: 29626150
Implantable Pulse Generator Implant Date: 20250114
Lead Channel Impedance Value: 384 Ohm
Lead Channel Impedance Value: 643 Ohm
Lead Channel Pacing Threshold Amplitude: 1.4 V
Lead Channel Pacing Threshold Pulse Width: 0.4 ms
Lead Channel Sensing Intrinsic Amplitude: 3.7 mV
Lead Channel Setting Pacing Amplitude: 3.5 V
Lead Channel Setting Pacing Pulse Width: 0.4 ms
Lead Channel Setting Sensing Sensitivity: 1.5 mV
Pulse Gen Serial Number: 135172
Zone Setting Status: 755011

## 2023-11-21 NOTE — Patient Instructions (Signed)

## 2023-11-21 NOTE — Progress Notes (Signed)
Normal Pacemaker wound check. Wound well healed. Thresholds, sensing, and impedances consistent with implant measurements and auto capture on until 3 month visit. No episodes. No arm restrictions due to gen change only.  Pt enrolled in remote follow-up.  NOTE: RV threshold good today 1.4@.95ms.  was 2.1@.4 ms at implant.  Auto capture programmed on.

## 2023-11-25 ENCOUNTER — Encounter: Payer: Self-pay | Admitting: Cardiology

## 2023-11-28 DIAGNOSIS — E785 Hyperlipidemia, unspecified: Secondary | ICD-10-CM | POA: Diagnosis not present

## 2023-11-28 DIAGNOSIS — R739 Hyperglycemia, unspecified: Secondary | ICD-10-CM | POA: Diagnosis not present

## 2023-12-03 DIAGNOSIS — E1122 Type 2 diabetes mellitus with diabetic chronic kidney disease: Secondary | ICD-10-CM | POA: Diagnosis not present

## 2023-12-03 DIAGNOSIS — E11628 Type 2 diabetes mellitus with other skin complications: Secondary | ICD-10-CM | POA: Diagnosis not present

## 2023-12-03 DIAGNOSIS — L089 Local infection of the skin and subcutaneous tissue, unspecified: Secondary | ICD-10-CM | POA: Diagnosis not present

## 2023-12-03 DIAGNOSIS — I10 Essential (primary) hypertension: Secondary | ICD-10-CM | POA: Diagnosis not present

## 2023-12-03 DIAGNOSIS — E782 Mixed hyperlipidemia: Secondary | ICD-10-CM | POA: Diagnosis not present

## 2023-12-03 DIAGNOSIS — E1165 Type 2 diabetes mellitus with hyperglycemia: Secondary | ICD-10-CM | POA: Diagnosis not present

## 2023-12-03 DIAGNOSIS — I129 Hypertensive chronic kidney disease with stage 1 through stage 4 chronic kidney disease, or unspecified chronic kidney disease: Secondary | ICD-10-CM | POA: Diagnosis not present

## 2023-12-03 DIAGNOSIS — Z789 Other specified health status: Secondary | ICD-10-CM | POA: Diagnosis not present

## 2023-12-04 ENCOUNTER — Ambulatory Visit (INDEPENDENT_AMBULATORY_CARE_PROVIDER_SITE_OTHER): Payer: Medicare Other | Admitting: Orthopedic Surgery

## 2023-12-04 ENCOUNTER — Other Ambulatory Visit (INDEPENDENT_AMBULATORY_CARE_PROVIDER_SITE_OTHER): Payer: Self-pay

## 2023-12-04 DIAGNOSIS — M79671 Pain in right foot: Secondary | ICD-10-CM | POA: Diagnosis not present

## 2023-12-04 DIAGNOSIS — L089 Local infection of the skin and subcutaneous tissue, unspecified: Secondary | ICD-10-CM | POA: Diagnosis not present

## 2023-12-04 MED ORDER — NITROGLYCERIN 0.2 MG/HR TD PT24: 0.2000 mg | MEDICATED_PATCH | Freq: Every day | TRANSDERMAL | 12 refills | Status: DC

## 2023-12-07 ENCOUNTER — Encounter: Payer: Self-pay | Admitting: Orthopedic Surgery

## 2023-12-07 NOTE — Addendum Note (Signed)
Addended by: Geralyn Flash D on: 12/07/2023 03:16 PM   Modules accepted: Orders

## 2023-12-07 NOTE — Progress Notes (Signed)
Remote pacemaker transmission.

## 2023-12-07 NOTE — Progress Notes (Signed)
Office Visit Note   Patient: Heather May           Date of Birth: 12-Feb-1946           MRN: [id number] Visit Date: 12/04/2023              Requested by: Lewis Moccasin, MD [address] [place],  [place] [address] PCP: Lewis Moccasin, MD  Chief Complaint  Patient presents with   Right Foot - Wound Check    Diabetic ulcer      HPI: Patient is a 78 year old woman who is seen for initial evaluation for right foot pain with ulceration to the third toe.  Patient has type 2 diabetes she has been followed by podiatry.  Patient states she was recommended to soak in Epsom salts and she was started on doxycycline.  Patient was also given a mouse pad to lift up her toes.  Assessment & Plan: Visit Diagnoses:  1. Pain in right foot     Plan: Nails were trimmed ulcer was debrided.  Recommended a longer wider shoe.  Patient is sent in a prescription for nitroglycerin patch to improve the microcirculation to the forefoot.  Follow-Up Instructions: Return in about 3 weeks (around 12/25/2023).   Ortho Exam  Patient is alert, oriented, no adenopathy, well-dressed, normal affect, normal respiratory effort. Examination Doppler was used she has a strong biphasic dorsalis pedis pulse.  Examination of her shoewear her toes extend to the end of her orthotic with shoe pressure causing ulceration to the forefoot.  Patient states she does have a history of neuropathy.  The nail of the fourth toe was digging into the third toe and the nail was trimmed.  Ulceration to the third toe was also debrided with a 10 blade knife back to healthy granulation tissue there is no exposed bone.  After debridement the ulcer is 1 cm diameter.  Patient has venous insufficiency with a calf of 39 cm in circumference.  There is no sausage digit swelling or cellulitis of the toe.  There is not a hemoglobin A1c in the epic chart.  Imaging: No results found. No images are attached to the encounter.  Labs: No results found  for: "HGBA1C", "ESRSEDRATE", "CRP", "LABURIC", "REPTSTATUS", "GRAMSTAIN", "CULT", "LABORGA"   Lab Results  Component Value Date   ALBUMIN 3.8 04/28/2022    No results found for: "MG" No results found for: "VD25OH"  No results found for: "PREALBUMIN"    Latest Ref Rng & Units 10/29/2023   11:29 AM 04/28/2022    5:36 PM 09/06/2020   12:16 PM  CBC EXTENDED  WBC 3.4 - 10.8 x10E3/uL 6.4  10.3  6.3   RBC 3.77 - 5.28 x10E6/uL 4.41  3.82  3.77   Hemoglobin 11.1 - 15.9 g/dL 19.1  47.8  29.5   HCT 34.0 - 46.6 % 43.4  36.6  37.5   Platelets 150 - 450 x10E3/uL 217  204  212   NEUT# 1.4 - 7.0 x10E3/uL 4.0  8.3    Lymph# 0.7 - 3.1 x10E3/uL 1.4  0.9       There is no height or weight on file to calculate BMI.  Orders:  Orders Placed This Encounter  Procedures   XR Foot Complete Right   Meds ordered this encounter  Medications   nitroGLYCERIN (NITRODUR - DOSED IN MG/24 HR) 0.2 mg/hr patch    Sig: Place 1 patch (0.2 mg total) onto the skin daily.    Dispense:  30 patch  Refill:  12     Procedures: No procedures performed  Clinical Data: No additional findings.  ROS:  All other systems negative, except as noted in the HPI. Review of Systems  Objective: Vital Signs: There were no vitals taken for this visit.  Specialty Comments:  No specialty comments available.  PMFS History: Patient Active Problem List   Diagnosis Date Noted   Complex sleep apnea syndrome 06/20/2018   Subarachnoid hematoma (HCC) 06/20/2018   OSA (obstructive sleep apnea) 05/03/2018   Pacemaker 01/03/2017   Morbid obesity (HCC) 01/03/2017   Actinic keratosis 02/02/2016   Encounter for screening mammogram for malignant neoplasm of breast 02/02/2016   Facial twitching 02/02/2016   Seasonal allergic rhinitis due to pollen 02/02/2016   Seizure prophylaxis 02/02/2016   Degenerative disc disease, cervical 11/03/2015   Coronary artery disease involving native coronary artery of native heart without  angina pectoris 11/01/2015   Type 2 diabetes mellitus with complication, without long-term current use of insulin (HCC) 11/01/2015   Essential tremor 11/01/2015   History of subarachnoid hemorrhage 11/01/2015   Persistent atrial fibrillation (HCC)    Atrial fibrillation (HCC) 08/26/2015   Bradycardia 07/11/2015   History of seizure    Essential hypertension    GERD (gastroesophageal reflux disease)    History of stroke    Chest pain    A-fib (HCC)    Diabetes mellitus without complication (HCC)    Parkinson's disease (HCC)    Transient ischemic attack    Subarachnoid hemorrhage (HCC)    Spinal stenosis    Diverticulosis    PAD (peripheral artery disease) (HCC)    Pulmonary embolism (HCC)    Myocardial infarction (HCC)    Limb pain    Percutaneous transluminal coronary angioplasty status    Abnormal EKG    Long term current use of anticoagulant therapy    Hyperlipidemia    History of long-term use of multiple prescription drugs    Past Medical History:  Diagnosis Date   Arthritis    joints   Blood transfusion without reported diagnosis    "when oldest daughter was born"   CAD (coronary artery disease) 08/15/13   Diabetes mellitus without complication (HCC)    Diverticulosis    Essential tremor    GERD (gastroesophageal reflux disease)    History of long-term use of multiple prescription drugs    Hyperlipidemia    mixed   Hypertension    Long-term (current) use of anticoagulants    PAD (peripheral artery disease) (HCC)    Percutaneous transluminal coronary angioplasty status    Permanent atrial fibrillation (HCC)    chads2vasc score is at least 5   Pulmonary embolism (HCC)    Pulmonary fibrosis (HCC)    Seizure (HCC) 2014   occured in setting of ICH   Sick sinus syndrome (HCC)    Spinal stenosis    Stroke (HCC)    Subarachnoid hemorrhage (HCC)    Transient ischemic attack     Family History  Problem Relation Age of Onset   Stroke Mother    Epilepsy Mother     Stroke Father    Heart attack Father    Atrial fibrillation Sister    Atrial fibrillation Brother    Diabetes Mellitus II Brother    Pulmonary embolism Son    Colon cancer Neg Hx    Esophageal cancer Neg Hx    Stomach cancer Neg Hx    Rectal cancer Neg Hx    Colon polyps Neg Hx  Past Surgical History:  Procedure Laterality Date   ABDOMINAL HYSTERECTOMY  2004   BASAL CELL CARCINOMA EXCISION     CARDIAC CATHETERIZATION  10/14   PCI    carpel tunnel surgery Bilateral 2006/ 2010   CESAREAN SECTION N/A 1978   x 1   COLONOSCOPY     HERNIA REPAIR  06/2010   LEFT ATRIAL APPENDAGE OCCLUSION N/A 08/26/2015   Procedure: LEFT ATRIAL APPENDAGE OCCLUSION;  Surgeon: Hillis Range, MD;  Location: [facility] INVASIVE CV LAB;  Service: Cardiovascular;  Laterality: N/A;   PACEMAKER INSERTION  03/05/15   [name] Accolade DR implanted in [place]   POLYPECTOMY     PPM GENERATOR CHANGEOUT N/A 11/06/2023   Procedure: PPM GENERATOR CHANGEOUT;  Surgeon: Lanier Prude, MD;  Location: [facility] INVASIVE CV LAB;  Service: Cardiovascular;  Laterality: N/A;   removal of big toe nail Bilateral    TEE WITHOUT CARDIOVERSION N/A 08/16/2015   Procedure: TRANSESOPHAGEAL ECHOCARDIOGRAM (TEE);  Surgeon: Chrystie Nose, MD;  Location: [facility] ENDOSCOPY;  Service: Cardiovascular;  Laterality: N/A;   TEE WITHOUT CARDIOVERSION N/A 10/12/2015   Procedure: TRANSESOPHAGEAL ECHOCARDIOGRAM (TEE);  Surgeon: Chrystie Nose, MD;  Location: [facility] ENDOSCOPY;  Service: Cardiovascular;  Laterality: N/A;   titanium pins inserted metatarsol bone  02/2012   2 pins placed    Social History   Occupational History   Occupation: Retired  Tobacco Use   Smoking status: Former    Current packs/day: 0.00    Types: Cigarettes    Quit date: 07/06/2009    Years since quitting: 14.4   Smokeless tobacco: Never   Tobacco comments:    didnt smoke much  Vaping Use   Vaping status: Never Used  Substance and Sexual Activity   Alcohol  use: No    Alcohol/week: 0.0 standard drinks of alcohol    Comment: very infrequently   Drug use: No   Sexual activity: Not on file

## 2023-12-25 ENCOUNTER — Ambulatory Visit (INDEPENDENT_AMBULATORY_CARE_PROVIDER_SITE_OTHER): Payer: Medicare Other | Admitting: Orthopedic Surgery

## 2023-12-25 DIAGNOSIS — L97511 Non-pressure chronic ulcer of other part of right foot limited to breakdown of skin: Secondary | ICD-10-CM

## 2023-12-25 DIAGNOSIS — M79671 Pain in right foot: Secondary | ICD-10-CM | POA: Diagnosis not present

## 2023-12-29 ENCOUNTER — Encounter: Payer: Self-pay | Admitting: Orthopedic Surgery

## 2023-12-29 NOTE — Progress Notes (Signed)
 Office Visit Note   Patient: Heather May           Date of Birth: 05/01/1946           MRN: [id number] Visit Date: 12/25/2023              Requested by: Lewis Moccasin, MD [address] [place],  [place] [address] PCP: Lewis Moccasin, MD  Chief Complaint  Patient presents with   Right Foot - Follow-up      HPI: Patient is a 78 year old woman who presents in follow-up for ulceration right foot third toe.  Currently in new balance sneakers and using a nitroglycerin patch.  Assessment & Plan: Visit Diagnoses:  1. Pain in right foot   2. Ulcer of right foot, limited to breakdown of skin (HCC)     Plan: The ulcer is completely healed she will continue with her sneakers discontinue the nitroglycerin patch  Follow-Up Instructions: No follow-ups on file.   Ortho Exam  Patient is alert, oriented, no adenopathy, well-dressed, normal affect, normal respiratory effort. Examination the ulcer third toe is healed there is no swelling no cellulitis no drainage.  Imaging: No results found. No images are attached to the encounter.  Labs: No results found for: "HGBA1C", "ESRSEDRATE", "CRP", "LABURIC", "REPTSTATUS", "GRAMSTAIN", "CULT", "LABORGA"   Lab Results  Component Value Date   ALBUMIN 3.8 04/28/2022    No results found for: "MG" No results found for: "VD25OH"  No results found for: "PREALBUMIN"    Latest Ref Rng & Units 10/29/2023   11:29 AM 04/28/2022    5:36 PM 09/06/2020   12:16 PM  CBC EXTENDED  WBC 3.4 - 10.8 x10E3/uL 6.4  10.3  6.3   RBC 3.77 - 5.28 x10E6/uL 4.41  3.82  3.77   Hemoglobin 11.1 - 15.9 g/dL 56.2  13.0  86.5   HCT 34.0 - 46.6 % 43.4  36.6  37.5   Platelets 150 - 450 x10E3/uL 217  204  212   NEUT# 1.4 - 7.0 x10E3/uL 4.0  8.3    Lymph# 0.7 - 3.1 x10E3/uL 1.4  0.9       There is no height or weight on file to calculate BMI.  Orders:  No orders of the defined types were placed in this encounter.  No orders of the defined types were placed  in this encounter.    Procedures: No procedures performed  Clinical Data: No additional findings.  ROS:  All other systems negative, except as noted in the HPI. Review of Systems  Objective: Vital Signs: There were no vitals taken for this visit.  Specialty Comments:  No specialty comments available.  PMFS History: Patient Active Problem List   Diagnosis Date Noted   Complex sleep apnea syndrome 06/20/2018   Subarachnoid hematoma (HCC) 06/20/2018   OSA (obstructive sleep apnea) 05/03/2018   Pacemaker 01/03/2017   Morbid obesity (HCC) 01/03/2017   Actinic keratosis 02/02/2016   Encounter for screening mammogram for malignant neoplasm of breast 02/02/2016   Facial twitching 02/02/2016   Seasonal allergic rhinitis due to pollen 02/02/2016   Seizure prophylaxis 02/02/2016   Degenerative disc disease, cervical 11/03/2015   Coronary artery disease involving native coronary artery of native heart without angina pectoris 11/01/2015   Type 2 diabetes mellitus with complication, without long-term current use of insulin (HCC) 11/01/2015   Essential tremor 11/01/2015   History of subarachnoid hemorrhage 11/01/2015   Persistent atrial fibrillation (HCC)    Atrial fibrillation (HCC) 08/26/2015  Bradycardia 07/11/2015   History of seizure    Essential hypertension    GERD (gastroesophageal reflux disease)    History of stroke    Chest pain    A-fib (HCC)    Diabetes mellitus without complication (HCC)    Parkinson's disease (HCC)    Transient ischemic attack    Subarachnoid hemorrhage (HCC)    Spinal stenosis    Diverticulosis    PAD (peripheral artery disease) (HCC)    Pulmonary embolism (HCC)    Myocardial infarction (HCC)    Limb pain    Percutaneous transluminal coronary angioplasty status    Abnormal EKG    Long term current use of anticoagulant therapy    Hyperlipidemia    History of long-term use of multiple prescription drugs    Past Medical History:   Diagnosis Date   Arthritis    joints   Blood transfusion without reported diagnosis    "when oldest daughter was born"   CAD (coronary artery disease) 08/15/13   Diabetes mellitus without complication (HCC)    Diverticulosis    Essential tremor    GERD (gastroesophageal reflux disease)    History of long-term use of multiple prescription drugs    Hyperlipidemia    mixed   Hypertension    Long-term (current) use of anticoagulants    PAD (peripheral artery disease) (HCC)    Percutaneous transluminal coronary angioplasty status    Permanent atrial fibrillation (HCC)    chads2vasc score is at least 5   Pulmonary embolism (HCC)    Pulmonary fibrosis (HCC)    Seizure (HCC) 2014   occured in setting of ICH   Sick sinus syndrome (HCC)    Spinal stenosis    Stroke (HCC)    Subarachnoid hemorrhage (HCC)    Transient ischemic attack     Family History  Problem Relation Age of Onset   Stroke Mother    Epilepsy Mother    Stroke Father    Heart attack Father    Atrial fibrillation Sister    Atrial fibrillation Brother    Diabetes Mellitus II Brother    Pulmonary embolism Son    Colon cancer Neg Hx    Esophageal cancer Neg Hx    Stomach cancer Neg Hx    Rectal cancer Neg Hx    Colon polyps Neg Hx     Past Surgical History:  Procedure Laterality Date   ABDOMINAL HYSTERECTOMY  2004   BASAL CELL CARCINOMA EXCISION     CARDIAC CATHETERIZATION  10/14   PCI    carpel tunnel surgery Bilateral 2006/ 2010   CESAREAN SECTION N/A 1978   x 1   COLONOSCOPY     HERNIA REPAIR  06/2010   LEFT ATRIAL APPENDAGE OCCLUSION N/A 08/26/2015   Procedure: LEFT ATRIAL APPENDAGE OCCLUSION;  Surgeon: Hillis Range, MD;  Location: [facility] INVASIVE CV LAB;  Service: Cardiovascular;  Laterality: N/A;   PACEMAKER INSERTION  03/05/15   [name] Accolade DR implanted in [place], [place]   POLYPECTOMY     PPM GENERATOR CHANGEOUT N/A 11/06/2023   Procedure: PPM GENERATOR CHANGEOUT;  Surgeon: Lanier Prude, MD;  Location: [facility] INVASIVE CV LAB;  Service: Cardiovascular;  Laterality: N/A;   removal of big toe nail Bilateral    TEE WITHOUT CARDIOVERSION N/A 08/16/2015   Procedure: TRANSESOPHAGEAL ECHOCARDIOGRAM (TEE);  Surgeon: Chrystie Nose, MD;  Location: [facility] ENDOSCOPY;  Service: Cardiovascular;  Laterality: N/A;   TEE WITHOUT CARDIOVERSION N/A 10/12/2015   Procedure: TRANSESOPHAGEAL  ECHOCARDIOGRAM (TEE);  Surgeon: Chrystie Nose, MD;  Location: [facility] ENDOSCOPY;  Service: Cardiovascular;  Laterality: N/A;   titanium pins inserted metatarsol bone  02/2012   2 pins placed    Social History   Occupational History   Occupation: Retired  Tobacco Use   Smoking status: Former    Current packs/day: 0.00    Types: Cigarettes    Quit date: 07/06/2009    Years since quitting: 14.4   Smokeless tobacco: Never   Tobacco comments:    didnt smoke much  Vaping Use   Vaping status: Never Used  Substance and Sexual Activity   Alcohol use: No    Alcohol/week: 0.0 standard drinks of alcohol    Comment: very infrequently   Drug use: No   Sexual activity: Not on file

## 2024-01-16 DIAGNOSIS — E1122 Type 2 diabetes mellitus with diabetic chronic kidney disease: Secondary | ICD-10-CM | POA: Diagnosis not present

## 2024-01-16 DIAGNOSIS — I519 Heart disease, unspecified: Secondary | ICD-10-CM | POA: Diagnosis not present

## 2024-01-16 DIAGNOSIS — I10 Essential (primary) hypertension: Secondary | ICD-10-CM | POA: Diagnosis not present

## 2024-01-16 DIAGNOSIS — E1165 Type 2 diabetes mellitus with hyperglycemia: Secondary | ICD-10-CM | POA: Diagnosis not present

## 2024-01-16 DIAGNOSIS — I129 Hypertensive chronic kidney disease with stage 1 through stage 4 chronic kidney disease, or unspecified chronic kidney disease: Secondary | ICD-10-CM | POA: Diagnosis not present

## 2024-01-16 DIAGNOSIS — E1121 Type 2 diabetes mellitus with diabetic nephropathy: Secondary | ICD-10-CM | POA: Diagnosis not present

## 2024-01-16 DIAGNOSIS — E1143 Type 2 diabetes mellitus with diabetic autonomic (poly)neuropathy: Secondary | ICD-10-CM | POA: Diagnosis not present

## 2024-01-21 DIAGNOSIS — E162 Hypoglycemia, unspecified: Secondary | ICD-10-CM | POA: Diagnosis not present

## 2024-01-21 DIAGNOSIS — E1165 Type 2 diabetes mellitus with hyperglycemia: Secondary | ICD-10-CM | POA: Diagnosis not present

## 2024-01-28 DIAGNOSIS — E1165 Type 2 diabetes mellitus with hyperglycemia: Secondary | ICD-10-CM | POA: Diagnosis not present

## 2024-01-28 DIAGNOSIS — E1121 Type 2 diabetes mellitus with diabetic nephropathy: Secondary | ICD-10-CM | POA: Diagnosis not present

## 2024-01-28 DIAGNOSIS — I10 Essential (primary) hypertension: Secondary | ICD-10-CM | POA: Diagnosis not present

## 2024-01-28 DIAGNOSIS — E663 Overweight: Secondary | ICD-10-CM | POA: Diagnosis not present

## 2024-01-28 DIAGNOSIS — E162 Hypoglycemia, unspecified: Secondary | ICD-10-CM | POA: Diagnosis not present

## 2024-02-05 ENCOUNTER — Ambulatory Visit: Payer: Medicare Other

## 2024-02-05 DIAGNOSIS — I77810 Thoracic aortic ectasia: Secondary | ICD-10-CM | POA: Diagnosis not present

## 2024-02-06 LAB — CUP PACEART REMOTE DEVICE CHECK
Battery Remaining Longevity: 168 mo
Battery Remaining Percentage: 100 %
Brady Statistic RA Percent Paced: 0 %
Brady Statistic RV Percent Paced: 90 %
Date Time Interrogation Session: 20250415043300
Implantable Lead Connection Status: 753985
Implantable Lead Connection Status: 753985
Implantable Lead Implant Date: 20160513
Implantable Lead Implant Date: 20160513
Implantable Lead Location: 753859
Implantable Lead Location: 753860
Implantable Lead Model: 4135
Implantable Lead Serial Number: 29626150
Implantable Pulse Generator Implant Date: 20250114
Lead Channel Impedance Value: 383 Ohm
Lead Channel Pacing Threshold Amplitude: 1.7 V
Lead Channel Pacing Threshold Pulse Width: 0.4 ms
Lead Channel Setting Pacing Amplitude: 3.5 V
Lead Channel Setting Pacing Pulse Width: 0.4 ms
Lead Channel Setting Sensing Sensitivity: 1.5 mV
Pulse Gen Serial Number: 135172
Zone Setting Status: 755011

## 2024-02-08 ENCOUNTER — Encounter: Payer: Self-pay | Admitting: Pulmonary Disease

## 2024-02-08 ENCOUNTER — Ambulatory Visit: Payer: Medicare Other | Attending: Pulmonary Disease | Admitting: Pulmonary Disease

## 2024-02-08 VITALS — BP 118/60 | HR 61 | Ht 66.0 in | Wt 225.0 lb

## 2024-02-08 DIAGNOSIS — Z95818 Presence of other cardiac implants and grafts: Secondary | ICD-10-CM | POA: Insufficient documentation

## 2024-02-08 DIAGNOSIS — I4821 Permanent atrial fibrillation: Secondary | ICD-10-CM | POA: Diagnosis not present

## 2024-02-08 DIAGNOSIS — Z950 Presence of cardiac pacemaker: Secondary | ICD-10-CM | POA: Diagnosis not present

## 2024-02-08 DIAGNOSIS — R001 Bradycardia, unspecified: Secondary | ICD-10-CM | POA: Diagnosis not present

## 2024-02-08 LAB — CUP PACEART INCLINIC DEVICE CHECK
Date Time Interrogation Session: 20250418152410
Implantable Lead Connection Status: 753985
Implantable Lead Connection Status: 753985
Implantable Lead Implant Date: 20160513
Implantable Lead Implant Date: 20160513
Implantable Lead Location: 753859
Implantable Lead Location: 753860
Implantable Lead Model: 4135
Implantable Lead Serial Number: 29626150
Implantable Pulse Generator Implant Date: 20250114
Lead Channel Impedance Value: 392 Ohm
Lead Channel Impedance Value: 663 Ohm
Lead Channel Pacing Threshold Amplitude: 1.2 V
Lead Channel Pacing Threshold Pulse Width: 0.4 ms
Lead Channel Sensing Intrinsic Amplitude: 2.8 mV
Lead Channel Setting Pacing Amplitude: 2.5 V
Lead Channel Setting Pacing Pulse Width: 0.4 ms
Lead Channel Setting Sensing Sensitivity: 1.5 mV
Pulse Gen Serial Number: 135172
Zone Setting Status: 755011

## 2024-02-08 NOTE — Progress Notes (Signed)
  Electrophysiology Office Note:   Date:  02/08/2024  ID:  Heather May, DOB Sep 27, 1946, MRN [id number]  Primary Cardiologist: Dorothye Gathers, MD Primary Heart Failure: None Electrophysiologist: Boyce Byes, MD       History of Present Illness:   Heather May is a 78 y.o. female with h/o symptomatic bradycardia s/p PPM, AF s/p Watchman, CAD, HTN, HLD, CVA, SAH, DM seen today for routine electrophysiology followup.   Since last being seen in our clinic the patient reports doing well overall. She is doing water aerobics, walking in the pool, and walking at church. She has noticed burning on her left sided pocket stinging.       She denies chest pain, palpitations, dyspnea, PND, orthopnea, nausea, vomiting, dizziness, syncope, edema, weight gain, or early satiety.   Review of systems complete and found to be negative unless listed in HPI.   EP Information / Studies Reviewed:    EKG is not ordered today. EKG from 08/15/23 reviewed which showed AF, VP 60 bpm      PPM Interrogation-  reviewed in detail today,  See PACEART report.  Device History: Field seismologist PPM implanted 03/05/2015 for Symptomatic bradycardia      Physical Exam:   VS:  BP 118/60 (BP Location: Left Arm)   Pulse 61   Ht 5\' 6"  (1.676 m)   Wt 225 lb (102.1 kg)   SpO2 96%   BMI 36.32 kg/m    Wt Readings from Last 3 Encounters:  02/08/24 225 lb (102.1 kg)  11/06/23 221 lb (100.2 kg)  08/27/23 226 lb (102.5 kg)     GEN: Well nourished, well developed in no acute distress NECK: No JVD; No carotid bruits CARDIAC: Regular rate and rhythm, no murmurs, rubs, gallops. PPM site well healed, no erythema, edema or swelling. No tethering.  RESPIRATORY:  Clear to auscultation without rales, wheezing or rhonchi  ABDOMEN: Soft, non-tender, non-distended EXTREMITIES:  No edema; No deformity   ASSESSMENT AND PLAN:    Symptomatic bradycardia s/p Environmental manager PPM  S/p generator change 11/06/23  -Normal PPM  function -See Pace Art report -No changes today -discussed PPM pocket stinging, may be ongoing healing of pocket or given left side only may be movement related irritation, if not improved pt to notify clinic. Site assessment within normal limits.   Atrial Fibrillation s/p Watchman  -device programmed VVIR  -continue ASA  -follows with Dr. Renna Cary for Cardiology    Disposition:   Follow up with Dr. Marven Slimmer or EP APP in 12 months  Signed, Creighton Doffing, NP-C, AGACNP-BC [facility] - Electrophysiology  02/08/2024, 4:41 PM

## 2024-02-08 NOTE — Patient Instructions (Signed)
 Medication Instructions:  Your physician recommends that you continue on your current medications as directed. Please refer to the Current Medication list given to you today.  *If you need a refill on your cardiac medications before your next appointment

## 2024-02-15 NOTE — Telephone Encounter (Signed)
 Instruction letter has been discarded since patient hasn't picked it from our office.

## 2024-02-17 ENCOUNTER — Encounter: Payer: Self-pay | Admitting: Cardiology

## 2024-02-20 DIAGNOSIS — E785 Hyperlipidemia, unspecified: Secondary | ICD-10-CM | POA: Diagnosis not present

## 2024-02-20 DIAGNOSIS — R739 Hyperglycemia, unspecified: Secondary | ICD-10-CM | POA: Diagnosis not present

## 2024-02-27 DIAGNOSIS — E1143 Type 2 diabetes mellitus with diabetic autonomic (poly)neuropathy: Secondary | ICD-10-CM | POA: Diagnosis not present

## 2024-02-27 DIAGNOSIS — E782 Mixed hyperlipidemia: Secondary | ICD-10-CM | POA: Diagnosis not present

## 2024-02-27 DIAGNOSIS — I10 Essential (primary) hypertension: Secondary | ICD-10-CM | POA: Diagnosis not present

## 2024-02-27 DIAGNOSIS — Z789 Other specified health status: Secondary | ICD-10-CM | POA: Diagnosis not present

## 2024-02-27 DIAGNOSIS — E1165 Type 2 diabetes mellitus with hyperglycemia: Secondary | ICD-10-CM | POA: Diagnosis not present

## 2024-02-27 DIAGNOSIS — E1169 Type 2 diabetes mellitus with other specified complication: Secondary | ICD-10-CM | POA: Diagnosis not present

## 2024-02-27 DIAGNOSIS — E1121 Type 2 diabetes mellitus with diabetic nephropathy: Secondary | ICD-10-CM | POA: Diagnosis not present

## 2024-03-21 NOTE — Addendum Note (Signed)
 Addended by: Lott Rouleau A on: 03/21/2024 02:14 PM   Modules accepted: Orders

## 2024-03-21 NOTE — Progress Notes (Signed)
 Remote pacemaker transmission.

## 2024-04-28 ENCOUNTER — Encounter (HOSPITAL_COMMUNITY): Payer: Self-pay

## 2024-04-30 ENCOUNTER — Other Ambulatory Visit: Payer: Self-pay | Admitting: *Deleted

## 2024-04-30 DIAGNOSIS — I77810 Thoracic aortic ectasia: Secondary | ICD-10-CM

## 2024-04-30 DIAGNOSIS — I351 Nonrheumatic aortic (valve) insufficiency: Secondary | ICD-10-CM

## 2024-05-02 ENCOUNTER — Telehealth: Payer: Self-pay

## 2024-05-02 ENCOUNTER — Telehealth: Payer: Self-pay | Admitting: Cardiology

## 2024-05-02 NOTE — Telephone Encounter (Signed)
 Paper Work Dropped Off:   Medical clearance for dental treatment  Date:05-02-24  Location of paper:  Put on [employer]

## 2024-05-02 NOTE — Telephone Encounter (Signed)
   Pre-operative Risk Assessment    Patient Name: Heather May  DOB: 1946/01/14 MRN: [id number]   Date of last office visit: 02/08/24 Date of next office visit: Not scheduled  Request for Surgical Clearance    Procedure:  3 Fillings  Date of Surgery:  Clearance TBD                                Surgeon:  Dr. Gordy Fairly Surgeon's Group or Practice Name:  [facility] Phone number:  [phone] Fax number:  [phone]   Type of Clearance Requested:   - Medical    Type of Anesthesia:  Not Indicated   Additional requests/questions:    [name]   05/02/2024, 4:23 PM

## 2024-05-05 NOTE — Telephone Encounter (Signed)
 Preop callback - can you clarify if they will be needing to do any anesthesia for this procedure or any specific concerns? Or is it all local?  Per prelim review, no indication for SBE PPX - Well over 6 months s/p original Watchman in 2016.

## 2024-05-05 NOTE — Telephone Encounter (Signed)
 DR. Maree needs ASA recommendations as well as anesthesia is local

## 2024-05-06 ENCOUNTER — Ambulatory Visit: Payer: Medicare Other

## 2024-05-06 DIAGNOSIS — I4821 Permanent atrial fibrillation: Secondary | ICD-10-CM | POA: Diagnosis not present

## 2024-05-06 LAB — CUP PACEART REMOTE DEVICE CHECK
Battery Remaining Longevity: 156 mo
Battery Remaining Percentage: 100 %
Brady Statistic RA Percent Paced: 0 %
Brady Statistic RV Percent Paced: 94 %
Date Time Interrogation Session: 20250715044400
Implantable Lead Connection Status: 753985
Implantable Lead Connection Status: 753985
Implantable Lead Implant Date: 20160513
Implantable Lead Implant Date: 20160513
Implantable Lead Location: 753859
Implantable Lead Location: 753860
Implantable Lead Model: 4135
Implantable Lead Serial Number: 29626150
Implantable Pulse Generator Implant Date: 20250114
Lead Channel Impedance Value: 378 Ohm
Lead Channel Pacing Threshold Amplitude: 2.3 V
Lead Channel Pacing Threshold Pulse Width: 0.4 ms
Lead Channel Setting Pacing Amplitude: 3.8 V
Lead Channel Setting Pacing Pulse Width: 0.4 ms
Lead Channel Setting Sensing Sensitivity: 1.5 mV
Pulse Gen Serial Number: 135172
Zone Setting Status: 755011

## 2024-05-07 ENCOUNTER — Telehealth: Payer: Self-pay

## 2024-05-07 NOTE — Telephone Encounter (Signed)
 Patient called back and has been scheduled for televisit

## 2024-05-07 NOTE — Telephone Encounter (Signed)
 Patient has been scheduled for televisit med rec and consent done     Patient Consent for Virtual Visit         Heather May has provided verbal consent on 05/07/2024 for a virtual visit (video or telephone).   CONSENT FOR VIRTUAL VISIT FOR:  Heather May  By participating in this virtual visit I agree to the following:  I hereby voluntarily request, consent and authorize [facility] and its employed or contracted physicians, physician assistants, nurse practitioners or other licensed health care professionals (the Practitioner), to provide me with telemedicine health care services (the "Services) as deemed necessary by the treating Practitioner. I acknowledge and consent to receive the Services by the Practitioner via telemedicine. I understand that the telemedicine visit will involve communicating with the Practitioner through live audiovisual communication technology and the disclosure of certain medical information by electronic transmission. I acknowledge that I have been given the opportunity to request an in-person assessment or other available alternative prior to the telemedicine visit and am voluntarily participating in the telemedicine visit.  I understand that I have the right to withhold or withdraw my consent to the use of telemedicine in the course of my care at any time, without affecting my right to future care or treatment, and that the Practitioner or I may terminate the telemedicine visit at any time. I understand that I have the right to inspect all information obtained and/or recorded in the course of the telemedicine visit and may receive copies of available information for a reasonable fee.  I understand that some of the potential risks of receiving the Services via telemedicine include:  Delay or interruption in medical evaluation due to technological equipment failure or disruption; Information transmitted may not be sufficient (e.g. poor resolution of images) to allow  for appropriate medical decision making by the Practitioner; and/or  In rare instances, security protocols could fail, causing a breach of personal health information.  Furthermore, I acknowledge that it is my responsibility to provide information about my medical history, conditions and care that is complete and accurate to the best of my ability. I acknowledge that Practitioner's advice, recommendations, and/or decision may be based on factors not within their control, such as incomplete or inaccurate data provided by me or distortions of diagnostic images or specimens that may result from electronic transmissions. I understand that the practice of medicine is not an exact science and that Practitioner makes no warranties or guarantees regarding treatment outcomes. I acknowledge that a copy of this consent can be made available to me via my patient portal [facility] MyChart), or I can request a printed copy by calling the office of [facility].    I understand that my insurance will be billed for this visit.   I have read or had this consent read to me. I understand the contents of this consent, which adequately explains the benefits and risks of the Services being provided via telemedicine.  I have been provided ample opportunity to ask questions regarding this consent and the Services and have had my questions answered to my satisfaction. I give my informed consent for the services to be provided through the use of telemedicine in my medical care

## 2024-05-07 NOTE — Telephone Encounter (Signed)
   Name: Heather May  DOB: 01-Nov-1945  MRN: [id number]  Primary Cardiologist: Oneil Parchment, MD   Preoperative team, please contact this patient and set up a phone call appointment for further preoperative risk assessment. Please obtain consent and complete medication review. Thank you for your help. Last seen by Daphne Barrack, NP 4/148/2025.   I confirm that guidance regarding antiplatelet and oral anticoagulation therapy has been completed and, if necessary, noted below.   Per office protocol, if patient is without any new symptoms or concerns at the time of their virtual visit, he/she may hold ASA for 7 days prior to procedure. Please resume ASA as soon as possible postprocedure, at the discretion of the surgeon.  No indication for SBE prophylaxis well over 6 months s/p original Walkman in situ since 2016.  I also confirmed the patient resides in the state of [place] . As per [facility] Medical Board telemedicine laws, the patient must reside in the state in which the provider is licensed.   Lamarr Satterfield, NP 05/07/2024, 8:38 AM  [facility]

## 2024-05-07 NOTE — Telephone Encounter (Signed)
 Tried contacting patient to schedule TELEVISIT no answer left a detailed vm to call back and schedule

## 2024-05-09 ENCOUNTER — Ambulatory Visit: Payer: Self-pay | Admitting: Cardiology

## 2024-05-09 ENCOUNTER — Ambulatory Visit: Attending: Cardiovascular Disease | Admitting: Emergency Medicine

## 2024-05-09 DIAGNOSIS — Z01810 Encounter for preprocedural cardiovascular examination: Secondary | ICD-10-CM | POA: Diagnosis not present

## 2024-05-09 NOTE — Progress Notes (Signed)
 Virtual Visit via Telephone Note   Because of Adonis Ryther co-morbid illnesses, she is at least at moderate risk for complications without adequate follow up.  This format is felt to be most appropriate for this patient at this time.  Due to technical limitations with video connection Web designer), today's appointment will be conducted as an audio only telehealth visit, and Heather May verbally agreed to proceed in this manner.   All issues noted in this document were discussed and addressed.  No physical exam could be performed with this format.  Evaluation Performed:  Preoperative cardiovascular risk assessment _____________   Date:  05/09/2024   Patient ID:  Heather May, DOB [birth year], MRN [id number] Patient Location:  Home Provider location:   Office  Primary Care Provider:  Waylan Almarie SAUNDERS, MD Primary Cardiologist:  Oneil Parchment, MD  Chief Complaint / Patient Profile   78 y.o. y/o female with a h/o bradycardia s/p PPM, atrial fibrillation s/p watchman, coronary artery disease, hypertension, hyperlipidemia, CVA, SAH, diabetes, moderate aortic valve regurgitation, mild dilated ascending aorta who is pending 3 fillings with pleasant dental on date TBD and presents today for telephonic preoperative cardiovascular risk assessment.  History of Present Illness    Heather May is a 78 y.o. female who presents via audio/video conferencing for a telehealth visit today.  Pt was last seen in cardiology clinic on 02/08/2024 by Daphne Barrack, NP.  At that time Heather May was doing well.  The patient is now pending procedure as outlined above. Since her last visit, she denies chest pain, shortness of breath, lower extremity edema, fatigue, palpitations, melena, hematuria, hemoptysis, diaphoresis, weakness, presyncope, syncope, orthopnea, and PND.  Today patient is doing well overall.  She is without acute cardiovascular concerns or complaints at this time.  She denies any anginal symptoms, chest pain,  dyspnea.  Overall she stays fairly active and does pool yoga twice a week and the other days she will walk for at least 1 hour in the pool without any exertional symptoms.  Overall she is able to complete greater than 4 METS.  Past Medical History    Past Medical History:  Diagnosis Date   Arthritis    joints   Blood transfusion without reported diagnosis    when oldest daughter was born   CAD (coronary artery disease) 08/15/13   Diabetes mellitus without complication (HCC)    Diverticulosis    Essential tremor    GERD (gastroesophageal reflux disease)    History of long-term use of multiple prescription drugs    Hyperlipidemia    mixed   Hypertension    Long-term (current) use of anticoagulants    PAD (peripheral artery disease) (HCC)    Percutaneous transluminal coronary angioplasty status    Permanent atrial fibrillation (HCC)    chads2vasc score is at least 5   Pulmonary embolism (HCC)    Pulmonary fibrosis (HCC)    Seizure (HCC) 2014   occured in setting of ICH   Sick sinus syndrome (HCC)    Spinal stenosis    Stroke (HCC)    Subarachnoid hemorrhage (HCC)    Transient ischemic attack    Past Surgical History:  Procedure Laterality Date   ABDOMINAL HYSTERECTOMY  2004   BASAL CELL CARCINOMA EXCISION     CARDIAC CATHETERIZATION  10/14   PCI    carpel tunnel surgery Bilateral 2006/ 2010   CESAREAN SECTION N/A 1978   x 1   COLONOSCOPY     HERNIA REPAIR  06/2010  LEFT ATRIAL APPENDAGE OCCLUSION N/A 08/26/2015   Procedure: LEFT ATRIAL APPENDAGE OCCLUSION;  Surgeon: Lynwood Rakers, MD;  Location: [facility] INVASIVE CV LAB;  Service: Cardiovascular;  Laterality: N/A;   PACEMAKER INSERTION  03/05/15   [name] Accolade DR implanted in [place], [place]   POLYPECTOMY     PPM GENERATOR CHANGEOUT N/A 11/06/2023   Procedure: PPM GENERATOR CHANGEOUT;  Surgeon: Cindie Ole DASEN, MD;  Location: [facility] INVASIVE CV LAB;  Service: Cardiovascular;  Laterality: N/A;   removal of big  toe nail Bilateral    TEE WITHOUT CARDIOVERSION N/A 08/16/2015   Procedure: TRANSESOPHAGEAL ECHOCARDIOGRAM (TEE);  Surgeon: Vinie JAYSON Maxcy, MD;  Location: [facility] ENDOSCOPY;  Service: Cardiovascular;  Laterality: N/A;   TEE WITHOUT CARDIOVERSION N/A 10/12/2015   Procedure: TRANSESOPHAGEAL ECHOCARDIOGRAM (TEE);  Surgeon: Vinie JAYSON Maxcy, MD;  Location: [facility] ENDOSCOPY;  Service: Cardiovascular;  Laterality: N/A;   titanium pins inserted metatarsol bone  02/2012   2 pins placed     Allergies  Allergies  Allergen Reactions   Hydrocodone  Nausea And Vomiting    Other reaction(s): other   Sulfa Antibiotics     RASH    Home Medications    Prior to Admission medications   Medication Sig Start Date End Date Taking? Authorizing Provider  acetaminophen  (TYLENOL ) 500 MG tablet Take 500 mg by mouth daily.    [provider]  aspirin  EC 81 MG tablet Take 1 tablet (81 mg total) by mouth daily. Swallow whole. 11/09/23   Lesia Ozell Barter, PA-C  benazepril  (LOTENSIN ) 10 MG tablet Take 10 mg by mouth daily. 09/03/23   [provider]  Cholecalciferol (VITAMIN D3) 50 MCG (2000 UT) TABS Take 2,000 Units by mouth daily.    [provider]  Dulaglutide (TRULICITY) 4.5 MG/0.5ML SOPN Inject 4.5 mg into the skin once a week.    [provider]  FARXIGA 10 MG TABS tablet Take 10 mg by mouth daily. 09/02/23   [provider]  FIBER GUMMIES PO Take 2 tablets by mouth 2 (two) times daily.    [provider]  hydrochlorothiazide (HYDRODIURIL) 25 MG tablet Take 25 mg by mouth daily.  02/27/19   [provider]  ibuprofen (ADVIL) 200 MG tablet Take 600 mg by mouth daily.    [provider]  LANTUS SOLOSTAR 100 UNIT/ML Solostar Pen Inject 40 Units into the skin daily. 12/25/23   [provider]  loratadine (CLARITIN) 10 MG tablet Take 10 mg by mouth daily.    [provider]  lovastatin (MEVACOR) 40 MG tablet Take 40 mg by mouth at  bedtime.    [provider]  Multiple Vitamin (MULTIVITAMIN) tablet Take 1 tablet by mouth daily.    [provider]  nitroGLYCERIN  (NITROSTAT ) 0.4 MG SL tablet Place 1 tablet (0.4 mg total) under the tongue every 5 (five) minutes as needed for chest pain. 12/31/19   Allred, Lynwood, MD  ondansetron  (ZOFRAN ) 4 MG tablet Take 4 mg by mouth every 8 (eight) hours as needed for nausea or vomiting.    [provider]    Physical Exam    Vital Signs:  Heather May does not have vital signs available for review today  Given telephonic nature of communication, physical exam is limited. AAOx3. NAD. Normal affect.  Speech and respirations are unlabored.  Accessory Clinical Findings    None  Assessment & Plan    1.  Preoperative Cardiovascular Risk Assessment: According to the Revised Cardiac Risk Index (RCRI), her  Perioperative Risk of Major Cardiac Event is (%): 0.9. Her Functional Capacity in METs is: 5.81 according to the Duke Activity Status Index (DASI). Therefore, based on ACC/AHA guidelines, patient would be at acceptable risk for the planned procedure without further cardiovascular testing. I will route this recommendation to the requesting party via Epic fax function.  The patient was advised that if she develops new symptoms prior to surgery to contact our office to arrange for a follow-up visit, and she verbalized understanding.  Ideally aspirin  should be continued without interruption, however if the bleeding risk is too great, aspirin  may be held for 5-7 days prior to surgery. Please resume aspirin  post operatively when it is felt to be safe from a bleeding standpoint.    SBE prophylaxis is not required for the patient. No indication for SBE prophylaxis well over 6 months s/p Watchman in situ since 2016   A copy of this note will be routed to requesting surgeon.  Time:   Today, I have spent 9 minutes with the patient with telehealth technology discussing  medical history, symptoms, and management plan.     Lum LITTIE Louis, NP  05/09/2024, 1:51 PM

## 2024-05-23 DIAGNOSIS — R5383 Other fatigue: Secondary | ICD-10-CM | POA: Diagnosis not present

## 2024-05-23 DIAGNOSIS — R739 Hyperglycemia, unspecified: Secondary | ICD-10-CM | POA: Diagnosis not present

## 2024-05-28 DIAGNOSIS — M170 Bilateral primary osteoarthritis of knee: Secondary | ICD-10-CM | POA: Diagnosis not present

## 2024-05-28 DIAGNOSIS — E1122 Type 2 diabetes mellitus with diabetic chronic kidney disease: Secondary | ICD-10-CM | POA: Diagnosis not present

## 2024-05-28 DIAGNOSIS — I129 Hypertensive chronic kidney disease with stage 1 through stage 4 chronic kidney disease, or unspecified chronic kidney disease: Secondary | ICD-10-CM | POA: Diagnosis not present

## 2024-05-28 DIAGNOSIS — Z7985 Long-term (current) use of injectable non-insulin antidiabetic drugs: Secondary | ICD-10-CM | POA: Diagnosis not present

## 2024-05-28 DIAGNOSIS — Z7984 Long term (current) use of oral hypoglycemic drugs: Secondary | ICD-10-CM | POA: Diagnosis not present

## 2024-05-28 DIAGNOSIS — I10 Essential (primary) hypertension: Secondary | ICD-10-CM | POA: Diagnosis not present

## 2024-05-28 DIAGNOSIS — E1169 Type 2 diabetes mellitus with other specified complication: Secondary | ICD-10-CM | POA: Diagnosis not present

## 2024-05-28 DIAGNOSIS — E782 Mixed hyperlipidemia: Secondary | ICD-10-CM | POA: Diagnosis not present

## 2024-05-28 DIAGNOSIS — I4891 Unspecified atrial fibrillation: Secondary | ICD-10-CM | POA: Diagnosis not present

## 2024-05-28 DIAGNOSIS — E1121 Type 2 diabetes mellitus with diabetic nephropathy: Secondary | ICD-10-CM | POA: Diagnosis not present

## 2024-05-28 DIAGNOSIS — E1165 Type 2 diabetes mellitus with hyperglycemia: Secondary | ICD-10-CM | POA: Diagnosis not present

## 2024-05-29 DIAGNOSIS — M1712 Unilateral primary osteoarthritis, left knee: Secondary | ICD-10-CM | POA: Diagnosis not present

## 2024-06-02 DIAGNOSIS — M170 Bilateral primary osteoarthritis of knee: Secondary | ICD-10-CM | POA: Diagnosis not present

## 2024-06-02 DIAGNOSIS — L918 Other hypertrophic disorders of the skin: Secondary | ICD-10-CM | POA: Diagnosis not present

## 2024-06-02 DIAGNOSIS — I10 Essential (primary) hypertension: Secondary | ICD-10-CM | POA: Diagnosis not present

## 2024-06-03 ENCOUNTER — Ambulatory Visit: Payer: Self-pay | Admitting: Cardiology

## 2024-06-03 ENCOUNTER — Ambulatory Visit (HOSPITAL_COMMUNITY)
Admission: RE | Admit: 2024-06-03 | Discharge: 2024-06-03 | Disposition: A | Discharge status: Home / Self Care | Source: Ambulatory Visit | Attending: Cardiology | Admitting: Cardiology

## 2024-06-03 DIAGNOSIS — I351 Nonrheumatic aortic (valve) insufficiency: Secondary | ICD-10-CM | POA: Diagnosis not present

## 2024-06-03 DIAGNOSIS — I77810 Thoracic aortic ectasia: Secondary | ICD-10-CM | POA: Diagnosis not present

## 2024-06-03 LAB — ECHOCARDIOGRAM COMPLETE
AV Mean grad: 5 mmHg
AV Peak grad: 8.7 mmHg
Ao pk vel: 1.47 m/s
S' Lateral: 2.45 cm

## 2024-06-16 DIAGNOSIS — Z23 Encounter for immunization: Secondary | ICD-10-CM | POA: Diagnosis not present

## 2024-06-16 DIAGNOSIS — M170 Bilateral primary osteoarthritis of knee: Secondary | ICD-10-CM | POA: Diagnosis not present

## 2024-06-24 ENCOUNTER — Other Ambulatory Visit: Payer: Self-pay | Admitting: *Deleted

## 2024-06-24 DIAGNOSIS — I351 Nonrheumatic aortic (valve) insufficiency: Secondary | ICD-10-CM

## 2024-06-24 DIAGNOSIS — I77810 Thoracic aortic ectasia: Secondary | ICD-10-CM

## 2024-06-24 DIAGNOSIS — M170 Bilateral primary osteoarthritis of knee: Secondary | ICD-10-CM | POA: Diagnosis not present

## 2024-07-30 NOTE — Progress Notes (Signed)
 Remote PPM Transmission

## 2024-08-05 ENCOUNTER — Ambulatory Visit: Payer: Medicare Other

## 2024-08-05 DIAGNOSIS — I4821 Permanent atrial fibrillation: Secondary | ICD-10-CM

## 2024-08-07 ENCOUNTER — Ambulatory Visit: Payer: Self-pay | Admitting: Cardiology

## 2024-08-07 LAB — CUP PACEART REMOTE DEVICE CHECK
Battery Remaining Longevity: 150 mo
Battery Remaining Percentage: 100 %
Brady Statistic RA Percent Paced: 0 %
Brady Statistic RV Percent Paced: 93 %
Date Time Interrogation Session: 20251014043000
Implantable Lead Connection Status: 753985
Implantable Lead Connection Status: 753985
Implantable Lead Implant Date: 20160513
Implantable Lead Implant Date: 20160513
Implantable Lead Location: 753859
Implantable Lead Location: 753860
Implantable Lead Model: 4135
Implantable Lead Serial Number: 29626150
Implantable Pulse Generator Implant Date: 20250114
Lead Channel Impedance Value: 370 Ohm
Lead Channel Pacing Threshold Amplitude: 2.4 V
Lead Channel Pacing Threshold Pulse Width: 0.4 ms
Lead Channel Setting Pacing Amplitude: 2.8 V
Lead Channel Setting Pacing Pulse Width: 0.4 ms
Lead Channel Setting Sensing Sensitivity: 1.5 mV
Pulse Gen Serial Number: 135172
Zone Setting Status: 755011

## 2024-08-08 NOTE — Progress Notes (Signed)
 Remote PPM Transmission

## 2024-08-25 ENCOUNTER — Encounter: Payer: Self-pay | Admitting: Radiology

## 2024-08-27 NOTE — Progress Notes (Signed)
 PATIENT: Heather May DOB: October 01, 1946  REASON FOR VISIT: follow up HISTORY FROM: patient  Chief Complaint  Patient presents with   Obstructive Sleep Apnea    Rm2, alone, Pt is here for osa follow up and is compliant to use machine    HISTORY OF PRESENT ILLNESS:  09/02/2024 ALL:  Heather May returns for follow up for OSA on auto BiPAP. She reports doing well on therapy. She denies concerns with mask or supplies. She is sleeping well. She does report sleeping on her back. She has not been able to sleep on her side since CVA. She continues asa and lovastatin. She has follow up with cardiology 10/2023. Last Echo reportedly normal.     08/27/2023 ALL:  Heather May returns for follow up for OSA on ASV. 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 Likely related to improved cardiac condition. Unable to order new ASV. Dr Dohmeier advised she continue using ASV if functional and could order autoBiPAP if needed. AutoPAP received 07/2023.   Since, she reports doing well. She had difficulty with the mask seal in the beginning but after working with DME, she is adjusting well. Now using small FFM. She feels she is resting well. No significant change in how she feels. She continues asa and lovastatin. She is followed closely by PCP and cardiology. She is planning to have pacemaker battery replaced in 3 months.     04/18/2023 ALL: Heather May returns for follow up for complex sleep apnea on ASV therapy. She was seen by Dr Chalice 12/2022. She advised split night study 03/2023 as she was eligible for new machine and AHI remained elevated at 10/hr. Study scheduled 04/29/2023. She has been doing fairly well on therapy. Now using a small Airfit N20 and feels it fits better.     09/27/2022 ALL: Heather May returns for follow up for complex slep apnea on ASV therapy. She was last seen 03/2022 and doing well. Annual  follow up advised. Since, AirFit N20 size M. She reports over the past 3-4 months, she has noted an air leak. Air blows on her face throughout the night. She is adjusting mask multiple times throughout the night. Her partner reports that she seems to be snoring more. Apneic events have been elevated, previously well managed. She continues to note benefit of using ASV. She sleeps well. Wakes feeling refreshed. She always sleeps in supine position. Can not sleep on side.     04/19/2022 ALL: Heather May returns for follow up for complex sleep apnea on ASV. She is doing well. She is using therapy nightly for about 8 hours. She denies concerns with machine or supplies.   Compliance data from 03/17/2022-04/15/2022 shows she used ASV 29/30 nights. She used ASV greater than 4 hours 29/30 nights (97%). Average usage was 8 hours and 24 min. Residual AHI was 2.5 with min EPAP 5, max EPAP 5, Min PS 3 and max PS 15. No significant leak noted.   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 She continues to very well. She is using ASV nightly. She sleeps well. She denies seizure activity. She had Covid last month. She recovered well. She was fully vaccinated and booster. She is no longer taking Plavix . She has continued asa 81mg . Head tremor is stable. She continues to use Rolator.     02/05/2020 ALL:  Heather May is a 78 y.o.  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 She reports one episode where she felt as if she were smothering but this has not occurred recently. She is doing well with her machine and has all needed supplies. She does admit to not using ASV 1 night every week as she helps to care for her grandchildren at her daughter's home. She does note benefit of using ASV therapy.  Compliance report dated 11/06/2019 through 02/03/2020 reveals that she used ASV 80 of the past 90 days for compliance of 89%. She used ASV therapy greater than 4 hours 73 of  the past 90 days for compliance of 81%. Average usage was 6 hours and 26 minutes. Residual AHI was 1.6 with minimum EPAP of 5 cm of water, minimum pressure support of 3 cm of water and maximum pressure support of 15 cm of water. There was no significant leak noted.  She discontinued levetiracetam  last year. No seizure activity. She continues Plavix  75mg  daily in setting of previous CVA, CAD s/p cardiac stents and atril fib. She is followed closely by PCP for stroke prevention and management of co morbidities. She is also followed regularly by cardiology. She requested to stop Plavix  at her last follow-up but was instructed to continue due to cardiac stents. She is exercising regularly. She participates with [employer] sne[employer] at her local [employer] 3 days a week. She stays well-hydrated. She does note chronic pain but feels that she is well managed. She uses her Rollator for long distance walking.  HISTORY: (copied from my note on 12/20/2018)  Heather May is a 78 y.o. 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  Compliance report dated 11/16/2018 through 12/15/2018 reveals that she is using BiPAP 29 out of 30 days for compliance 97%.  She is using her BiPAP for greater than 4 hours 23 out of 30 days for compliance of 77%.  Average usage is 5 hours and 32 minutes.  AHI was 4.7.  There is no significant leak.  She reports that she has done well since mask refitting.  She does note benefit from using BiPAP as she is less sleepy during the day.  She admits that she often goes to bed late and does not use the machine for greater than 4 hours.   She has been seen in the past for [facility] in 2014, seizure post SAH and lucunar infarct in 2019.  It was noted that Keppra  should be discontinued however she has continued to take this medication.  She has not had any seizure activity and only one with SAH.SABRA She was also diagnosed with Parkinson's around 2015 but was later told that she had an essential tremor.  She  reports that she requested a second opinion after participating in a Parkinson's support group.  She did not feel that she had any symptoms related to Parkinson's with the exception of a head tremor.  Head tremor is worse when she is nervous or tired.  She has not noticed any changes with her gait or ability to move.  No concerns of memory loss this time.  REVIEW OF SYSTEMS: Out of a complete 14 system review of symptoms, the patient complains only of the following symptoms, chronic pain, fatigue and all other reviewed systems are negative.  ESS: 524  ALLERGIES: Allergies  Allergen Reactions   Hydrocodone  Nausea And Vomiting    Other reaction(s): other   Sulfa Antibiotics     RASH  HOME MEDICATIONS: Outpatient Medications Prior to Visit  Medication Sig Dispense Refill   acetaminophen  (TYLENOL ) 500 MG tablet Take 500 mg by mouth daily.     aspirin  EC 81 MG tablet Take 1 tablet (81 mg total) by mouth daily. Swallow whole.     Cholecalciferol (VITAMIN D3) 50 MCG (2000 UT) TABS Take 2,000 Units by mouth daily.     Dulaglutide (TRULICITY) 4.5 MG/0.5ML SOPN Inject 4.5 mg into the skin once a week.     FARXIGA 10 MG TABS tablet Take 10 mg by mouth daily.     FIBER GUMMIES PO Take 2 tablets by mouth 2 (two) times daily.     hydrochlorothiazide (HYDRODIURIL) 25 MG tablet Take 25 mg by mouth daily.      ibuprofen (ADVIL) 200 MG tablet Take 600 mg by mouth daily.     LANTUS SOLOSTAR 100 UNIT/ML Solostar Pen Inject 40 Units into the skin daily.     loratadine (CLARITIN) 10 MG tablet Take 10 mg by mouth daily.     lovastatin (MEVACOR) 40 MG tablet Take 40 mg by mouth at bedtime.     Multiple Vitamin (MULTIVITAMIN) tablet Take 1 tablet by mouth daily.     nitroGLYCERIN  (NITROSTAT ) 0.4 MG SL tablet Place 1 tablet (0.4 mg total) under the tongue every 5 (five) minutes as needed for chest pain. 25 tablet 1   ondansetron  (ZOFRAN ) 4 MG tablet Take 4 mg by mouth every 8 (eight) hours as needed for  nausea or vomiting.     benazepril  (LOTENSIN ) 10 MG tablet Take 10 mg by mouth daily.     No facility-administered medications prior to visit.    PAST MEDICAL HISTORY: Past Medical History:  Diagnosis Date   Arthritis    joints   Blood transfusion without reported diagnosis    when oldest daughter was born   CAD (coronary artery disease) 08/15/13   Diabetes mellitus without complication (HCC)    Diverticulosis    Essential tremor    GERD (gastroesophageal reflux disease)    History of long-term use of multiple prescription drugs    Hyperlipidemia    mixed   Hypertension    Long-term (current) use of anticoagulants    PAD (peripheral artery disease)    Percutaneous transluminal coronary angioplasty status    Permanent atrial fibrillation (HCC)    chads2vasc score is at least 5   Pulmonary embolism (HCC)    Pulmonary fibrosis (HCC)    Seizure (HCC) 2014   occured in setting of ICH   Sick sinus syndrome (HCC)    Spinal stenosis    Stroke (HCC)    Subarachnoid hemorrhage (HCC)    Transient ischemic attack     PAST SURGICAL HISTORY: Past Surgical History:  Procedure Laterality Date   ABDOMINAL HYSTERECTOMY  2004   BASAL CELL CARCINOMA EXCISION     CARDIAC CATHETERIZATION  10/14   PCI    carpel tunnel surgery Bilateral 2006/ 2010   CESAREAN SECTION N/A 1978   x 1   COLONOSCOPY     HERNIA REPAIR  06/2010   LEFT ATRIAL APPENDAGE OCCLUSION N/A 08/26/2015   Procedure: LEFT ATRIAL APPENDAGE OCCLUSION;  Surgeon: Lynwood Rakers, MD;  Location: [facility] INVASIVE CV LAB;  Service: Cardiovascular;  Laterality: N/A;   PACEMAKER INSERTION  03/05/15   [name] Accolade DR implanted in [place], [place]   POLYPECTOMY     PPM GENERATOR CHANGEOUT N/A 11/06/2023   Procedure: PPM GENERATOR CHANGEOUT;  Surgeon: Cindie Ole DASEN,  MD;  Location: [facility] INVASIVE CV LAB;  Service: Cardiovascular;  Laterality: N/A;   removal of big toe nail Bilateral    TEE WITHOUT CARDIOVERSION N/A  08/16/2015   Procedure: TRANSESOPHAGEAL ECHOCARDIOGRAM (TEE);  Surgeon: Vinie JAYSON Maxcy, MD;  Location: [facility] ENDOSCOPY;  Service: Cardiovascular;  Laterality: N/A;   TEE WITHOUT CARDIOVERSION N/A 10/12/2015   Procedure: TRANSESOPHAGEAL ECHOCARDIOGRAM (TEE);  Surgeon: Vinie JAYSON Maxcy, MD;  Location: [name] ENDOSCOPY;  Service: Cardiovascular;  Laterality: N/A;   titanium pins inserted metatarsol bone  02/2012   2 pins placed     FAMILY HISTORY: Family History  Problem Relation Age of Onset   Stroke Mother    Epilepsy Mother    Stroke Father    Heart attack Father    Atrial fibrillation Sister    Atrial fibrillation Brother    Diabetes Mellitus II Brother    Pulmonary embolism Son    Colon cancer Neg Hx    Esophageal cancer Neg Hx    Stomach cancer Neg Hx    Rectal cancer Neg Hx    Colon polyps Neg Hx     SOCIAL HISTORY: Social History   Socioeconomic History   Marital status: Significant Other    Spouse name: Not on file   Number of children: 5   Years of education: 12   Highest education level: Not on file  Occupational History   Occupation: Retired  Tobacco Use   Smoking status: Former    Current packs/day: 0.00    Types: Cigarettes    Quit date: 07/06/2009    Years since quitting: 15.1   Smokeless tobacco: Never   Tobacco comments:    didnt smoke much  Vaping Use   Vaping status: Never Used  Substance and Sexual Activity   Alcohol  use: No    Alcohol /week: 0.0 standard drinks of alcohol     Comment: very infrequently   Drug use: No   Sexual activity: Not on file  Other Topics Concern   Not on file  Social History Narrative   [place] recently moved to [place].    Previously worked as a [employer] teacher.   Caffeine use: only when eating out tea/soda   Right-handed   Lives at home with partner/roommate   Social Drivers of Health   Financial Resource Strain: Not on file  Food Insecurity: Not on file  Transportation Needs: Not on file  Physical  Activity: Not on file  Stress: Not on file  Social Connections: Not on file  Intimate Partner Violence: Not on file      PHYSICAL EXAM  Vitals:   09/01/24 1024  BP: 112/66  Pulse: 60  Resp: 15  SpO2: 98%  Height: 5' 6 (1.676 m)         Body mass index is 36.32 kg/m.  Generalized: Well developed, in no acute distress  Cardiology: normal rate and rhythm, no murmur noted Respiratory: clear to auscultation bilaterally  Neurological examination  Mentation: Alert oriented to time, place, history taking. Follows all commands speech and language fluent Cranial nerve II-XII: Pupils were equal round reactive to light. Extraocular movements were full, visual field were full on confrontational test. Facial sensation and strength were normal. Uvula tongue midline. Head turning and shoulder shrug  were normal and symmetric. Motor: The motor testing reveals 5 over 5 strength of bilateral upper extremities. 4+ of 5 of the left lower extremity, 4/5 of right lower extremity. Good symmetric motor tone is noted throughout. Head tremor noted,  worse when talking in exam room.  Gait and station: Uses both arms to push to a standing position. Gait is short but stable with Rolator. Tandem gait not assessed today.    DIAGNOSTIC DATA (LABS, IMAGING, TESTING) - I reviewed patient records, labs, notes, testing and imaging myself where available.      No data to display           Lab Results  Component Value Date   WBC 6.4 10/29/2023   HGB 14.3 10/29/2023   HCT 43.4 10/29/2023   MCV 98 (H) 10/29/2023   PLT 217 10/29/2023      Component Value Date/Time   NA 140 10/29/2023 1129   K 4.6 10/29/2023 1129   CL 99 10/29/2023 1129   CO2 25 10/29/2023 1129   GLUCOSE 139 (H) 10/29/2023 1129   GLUCOSE 168 (H) 04/28/2022 1736   BUN 37 (H) 10/29/2023 1129   CREATININE 1.23 (H) 10/29/2023 1129   CREATININE 0.74 10/13/2015 1053   CALCIUM 10.1 10/29/2023 1129   PROT 6.9 04/28/2022 1736   PROT  7.2 06/14/2016 1145   ALBUMIN 3.8 04/28/2022 1736   AST 56 (H) 04/28/2022 1736   ALT 36 04/28/2022 1736   ALKPHOS 80 04/28/2022 1736   BILITOT 0.7 04/28/2022 1736   GFRNONAA 48 (L) 04/28/2022 1736   GFRAA >60 08/27/2015 0347   No results found for: CHOL, HDL, LDLCALC, LDLDIRECT, TRIG, CHOLHDL No results found for: YHAJ8R Lab Results  Component Value Date   VITAMINB12 648 06/14/2016   Lab Results  Component Value Date   TSH 1.470 06/14/2016    ASSESSMENT AND PLAN 78 y.o. year old female  has a past medical history of Arthritis, Blood transfusion without reported diagnosis, CAD (coronary artery disease) (08/15/13), Diabetes mellitus without complication (HCC), Diverticulosis, Essential tremor, GERD (gastroesophageal reflux disease), History of long-term use of multiple prescription drugs, Hyperlipidemia, Hypertension, Long-term (current) use of anticoagulants, PAD (peripheral artery disease), Percutaneous transluminal coronary angioplasty status, Permanent atrial fibrillation (HCC), Pulmonary embolism (HCC), Pulmonary fibrosis (HCC), Seizure (HCC) (2014), Sick sinus syndrome (HCC), Spinal stenosis, Stroke (HCC), Subarachnoid hemorrhage (HCC), and Transient ischemic attack. here with     ICD-10-CM   1. 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 OSA treated with BiPAP  G47.33 For home use only DME Bipap    3. Subarachnoid hemorrhage (HCC)  I60.9     4. Longstanding persistent atrial fibrillation (HCC)  I48.11       Caysie is doing fairly well today. She continues auto BiPAP therapy nightly. Compliance report shows excellent compliance. She feels small N20 fits better. AHI elevated. She will continue therapy nightly for at least 4 hours. I have encouraged her to raise head of bed. I will recheck download in 6-8 weeks to assure AHI is well controlled with full month's data.  She will continue close follow-up with primary care and cardiology for management of co morbidities  and stroke prevention. She will continue healthy lifestyle habits with well-balanced diet and regular exercise. She will follow-up with me in 1 year. She verbalizes understanding and agreement with this plan.   Orders Placed This Encounter  Procedures   For home use only DME Bipap    Supplies    Length of Need:   Lifetime    Inspiratory pressure:   OTHER SEE COMMENTS    Expiratory pressure:   OTHER SEE COMMENTS      No orders of the defined types were placed in this encounter.  Greig Forbes, FNP-C 09/01/2024, 10:53 AM [facility] [address], [address] [place], [place] [address] [phone]

## 2024-08-27 NOTE — Patient Instructions (Addendum)
 Please continue using your BiPAP regularly. While your insurance requires that you use BiPAP at least 4 hours each night on 70% of the nights, I recommend, that you not skip any nights and use it throughout the night if you can. Getting used to BiPAP and staying with the treatment long term does take time and patience and discipline. Untreated obstructive sleep apnea when it is moderate to severe can have an adverse impact on cardiovascular health and raise her risk for heart disease, arrhythmias, hypertension, congestive heart failure, stroke and diabetes. Untreated obstructive sleep apnea causes sleep disruption, nonrestorative sleep, and sleep deprivation. This can have an impact on your day to day functioning and cause daytime sleepiness and impairment of cognitive function, memory loss, mood disturbance, and problems focussing. Using BiPAP regularly can improve these symptoms.  We will update supply orders, today. Try to raise the head of your bed. I will reassess download in 4-6 weeks.   Follow up in 1 year

## 2024-08-28 ENCOUNTER — Ambulatory Visit: Payer: Medicare Other | Admitting: Family Medicine

## 2024-08-28 NOTE — Progress Notes (Signed)
 SABRA

## 2024-09-01 ENCOUNTER — Telehealth: Payer: Self-pay

## 2024-09-01 ENCOUNTER — Ambulatory Visit: Payer: Medicare Other | Admitting: Family Medicine

## 2024-09-01 ENCOUNTER — Encounter: Payer: Self-pay | Admitting: Family Medicine

## 2024-09-01 VITALS — BP 112/66 | HR 60 | Resp 15 | Ht 66.0 in

## 2024-09-01 DIAGNOSIS — I4811 Longstanding persistent atrial fibrillation: Secondary | ICD-10-CM

## 2024-09-01 DIAGNOSIS — G4739 Other sleep apnea: Secondary | ICD-10-CM

## 2024-09-01 DIAGNOSIS — I609 Nontraumatic subarachnoid hemorrhage, unspecified: Secondary | ICD-10-CM | POA: Diagnosis not present

## 2024-09-01 DIAGNOSIS — G4733 Obstructive sleep apnea (adult) (pediatric): Secondary | ICD-10-CM

## 2024-09-01 NOTE — Telephone Encounter (Signed)
 Order sent to dme DME [employer]  [phone] [id number]

## 2024-09-04 DIAGNOSIS — E1165 Type 2 diabetes mellitus with hyperglycemia: Secondary | ICD-10-CM | POA: Diagnosis not present

## 2024-09-04 DIAGNOSIS — I10 Essential (primary) hypertension: Secondary | ICD-10-CM | POA: Diagnosis not present

## 2024-09-04 DIAGNOSIS — R739 Hyperglycemia, unspecified: Secondary | ICD-10-CM | POA: Diagnosis not present

## 2024-09-04 DIAGNOSIS — Z1339 Encounter for screening examination for other mental health and behavioral disorders: Secondary | ICD-10-CM | POA: Diagnosis not present

## 2024-09-04 DIAGNOSIS — Z0000 Encounter for general adult medical examination without abnormal findings: Secondary | ICD-10-CM | POA: Diagnosis not present

## 2024-09-04 DIAGNOSIS — Z7185 Encounter for immunization safety counseling: Secondary | ICD-10-CM | POA: Diagnosis not present

## 2024-09-04 DIAGNOSIS — Z1331 Encounter for screening for depression: Secondary | ICD-10-CM | POA: Diagnosis not present

## 2024-09-04 DIAGNOSIS — Z23 Encounter for immunization: Secondary | ICD-10-CM | POA: Diagnosis not present

## 2024-09-04 DIAGNOSIS — E119 Type 2 diabetes mellitus without complications: Secondary | ICD-10-CM | POA: Diagnosis not present

## 2024-09-08 DIAGNOSIS — H2513 Age-related nuclear cataract, bilateral: Secondary | ICD-10-CM | POA: Diagnosis not present

## 2024-09-08 DIAGNOSIS — H524 Presbyopia: Secondary | ICD-10-CM | POA: Diagnosis not present

## 2024-09-08 DIAGNOSIS — E119 Type 2 diabetes mellitus without complications: Secondary | ICD-10-CM | POA: Diagnosis not present

## 2024-09-15 DIAGNOSIS — I10 Essential (primary) hypertension: Secondary | ICD-10-CM | POA: Diagnosis not present

## 2024-09-15 DIAGNOSIS — E1165 Type 2 diabetes mellitus with hyperglycemia: Secondary | ICD-10-CM | POA: Diagnosis not present

## 2024-09-15 DIAGNOSIS — E1122 Type 2 diabetes mellitus with diabetic chronic kidney disease: Secondary | ICD-10-CM | POA: Diagnosis not present

## 2024-09-15 DIAGNOSIS — E11319 Type 2 diabetes mellitus with unspecified diabetic retinopathy without macular edema: Secondary | ICD-10-CM | POA: Diagnosis not present

## 2024-09-15 DIAGNOSIS — I129 Hypertensive chronic kidney disease with stage 1 through stage 4 chronic kidney disease, or unspecified chronic kidney disease: Secondary | ICD-10-CM | POA: Diagnosis not present

## 2024-09-15 DIAGNOSIS — Z7984 Long term (current) use of oral hypoglycemic drugs: Secondary | ICD-10-CM | POA: Diagnosis not present

## 2024-09-15 DIAGNOSIS — Z7985 Long-term (current) use of injectable non-insulin antidiabetic drugs: Secondary | ICD-10-CM | POA: Diagnosis not present

## 2024-09-15 DIAGNOSIS — Z79899 Other long term (current) drug therapy: Secondary | ICD-10-CM | POA: Diagnosis not present

## 2024-09-26 DIAGNOSIS — Z780 Asymptomatic menopausal state: Secondary | ICD-10-CM | POA: Diagnosis not present

## 2024-09-26 DIAGNOSIS — E559 Vitamin D deficiency, unspecified: Secondary | ICD-10-CM | POA: Diagnosis not present

## 2024-10-14 ENCOUNTER — Encounter: Payer: Self-pay | Admitting: Cardiology

## 2024-10-14 ENCOUNTER — Ambulatory Visit: Attending: Cardiology | Admitting: Cardiology

## 2024-10-14 VITALS — BP 132/62 | HR 59 | Ht 66.0 in | Wt 234.5 lb

## 2024-10-14 DIAGNOSIS — R001 Bradycardia, unspecified: Secondary | ICD-10-CM | POA: Diagnosis not present

## 2024-10-14 DIAGNOSIS — I4821 Permanent atrial fibrillation: Secondary | ICD-10-CM | POA: Insufficient documentation

## 2024-10-14 DIAGNOSIS — Z95818 Presence of other cardiac implants and grafts: Secondary | ICD-10-CM | POA: Diagnosis not present

## 2024-10-14 LAB — CUP PACEART INCLINIC DEVICE CHECK
Date Time Interrogation Session: 20251223090334
Implantable Lead Connection Status: 753985
Implantable Lead Connection Status: 753985
Implantable Lead Implant Date: 20160513
Implantable Lead Implant Date: 20160513
Implantable Lead Location: 753859
Implantable Lead Location: 753860
Implantable Lead Model: 4135
Implantable Lead Serial Number: 29626150
Implantable Pulse Generator Implant Date: 20250114
Pulse Gen Serial Number: 135172

## 2024-10-14 NOTE — Patient Instructions (Signed)

## 2024-10-14 NOTE — Progress Notes (Signed)
" °  Electrophysiology Office Follow up Visit Note:    Date:  10/14/2024   ID:  Heather May, DOB Dec 01, 1945, MRN [id number]  PCP:  Waylan Almarie SAUNDERS, MD  [facility] Cardiologist:  Oneil Parchment, MD  [facility] Electrophysiologist:  OLE ONEIDA HOLTS, MD    Interval History:     Heather May is a 78 y.o. female who presents for a follow up visit.   She was last seen by [facility] February 08, 2024.  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  She has atrial fibrillation and has a youth worker in place.  She is on aspirin .  She is doing well today.  No problems with her pacemaker.      Past medical, surgical, social and family history were reviewed.  ROS:   Please see the history of present illness.    All other systems reviewed and are negative.  EKGs/Labs/Other Studies Reviewed:    The following studies were reviewed today:  October 14, 2024 in-clinic device interrogation personally reviewed Battery and lead parameter stable today        Physical Exam:    VS:  There were no vitals taken for this visit.    Wt Readings from Last 3 Encounters:  02/08/24 225 lb (102.1 kg)  11/06/23 221 lb (100.2 kg)  08/27/23 226 lb (102.5 kg)     GEN: no distress CARD: RRR, No MRG.  Generator pocket well-healed RESP: No IWOB. CTAB.      ASSESSMENT:    1. Presence of Watchman left atrial appendage closure device   2. Permanent atrial fibrillation (HCC)   3. Symptomatic bradycardia    PLAN:    In order of problems listed above:  #Symptomatic pericardia #Permanent pacemaker in situ Device functioning appropriately.  Continue remote monitoring  #Atrial fibrillation #Watchman device in situ Continue aspirin   I discussed my upcoming departure from [facility] during today's clinic appointment.  The patient will continue to follow-up with one of my EP partners moving forward.  Follow-up 1 year with EP APP   Signed, Ole Holts, MD,  [facility], [facility] 10/14/2024 8:38 AM    Electrophysiology [facility] "

## 2024-10-22 ENCOUNTER — Ambulatory Visit: Payer: Self-pay | Admitting: Cardiovascular Disease

## 2024-11-04 ENCOUNTER — Ambulatory Visit: Payer: Medicare Other

## 2024-11-04 DIAGNOSIS — I4821 Permanent atrial fibrillation: Secondary | ICD-10-CM

## 2024-11-05 LAB — CUP PACEART REMOTE DEVICE CHECK
Battery Remaining Longevity: 144 mo
Battery Remaining Percentage: 100 %
Brady Statistic RA Percent Paced: 0 %
Brady Statistic RV Percent Paced: 96 %
Date Time Interrogation Session: 20260113043100
Implantable Lead Connection Status: 753985
Implantable Lead Connection Status: 753985
Implantable Lead Implant Date: 20160513
Implantable Lead Implant Date: 20160513
Implantable Lead Location: 753859
Implantable Lead Location: 753860
Implantable Lead Model: 4135
Implantable Lead Serial Number: 29626150
Implantable Pulse Generator Implant Date: 20250114
Lead Channel Impedance Value: 406 Ohm
Lead Channel Pacing Threshold Amplitude: 2.2 V
Lead Channel Pacing Threshold Pulse Width: 0.4 ms
Lead Channel Setting Pacing Amplitude: 3.5 V
Lead Channel Setting Pacing Pulse Width: 0.4 ms
Lead Channel Setting Sensing Sensitivity: 1.5 mV
Pulse Gen Serial Number: 135172
Zone Setting Status: 755011

## 2024-11-07 ENCOUNTER — Ambulatory Visit: Payer: Self-pay | Admitting: Cardiovascular Disease

## 2024-11-07 NOTE — Progress Notes (Signed)
 Remote PPM Transmission
